# Patient Record
Sex: Male | Born: 1945
Health system: Southern US, Community
[De-identification: ages and names within clinical notes are randomized; demographics above are authoritative.]

## PROBLEM LIST (undated history)

## (undated) DIAGNOSIS — I639 Cerebral infarction, unspecified: Secondary | ICD-10-CM

## (undated) HISTORY — PX: ABDOMINAL SURGERY: SHX537

---

## 2018-03-21 ENCOUNTER — Encounter: Payer: Self-pay | Admitting: Emergency Medicine

## 2018-03-21 ENCOUNTER — Emergency Department
Admission: EM | Admit: 2018-03-21 | Discharge: 2018-03-21 | Disposition: A | Discharge status: Home / Self Care | Payer: Medicare Other | Attending: Emergency Medicine | Admitting: Emergency Medicine

## 2018-03-21 ENCOUNTER — Other Ambulatory Visit: Payer: Self-pay

## 2018-03-21 ENCOUNTER — Emergency Department: Payer: Medicare Other

## 2018-03-21 DIAGNOSIS — R4182 Altered mental status, unspecified: Secondary | ICD-10-CM | POA: Diagnosis not present

## 2018-03-21 DIAGNOSIS — W19XXXA Unspecified fall, initial encounter: Secondary | ICD-10-CM

## 2018-03-21 DIAGNOSIS — N390 Urinary tract infection, site not specified: Secondary | ICD-10-CM | POA: Diagnosis not present

## 2018-03-21 DIAGNOSIS — R531 Weakness: Secondary | ICD-10-CM | POA: Diagnosis present

## 2018-03-21 HISTORY — DX: Cerebral infarction, unspecified: I63.9

## 2018-03-21 LAB — URINALYSIS, COMPLETE (UACMP) WITH MICROSCOPIC
Bacteria, UA: NONE SEEN
Bilirubin Urine: NEGATIVE
GLUCOSE, UA: NEGATIVE mg/dL
HGB URINE DIPSTICK: NEGATIVE
Ketones, ur: NEGATIVE mg/dL
NITRITE: NEGATIVE
Protein, ur: NEGATIVE mg/dL
Specific Gravity, Urine: 1.021 (ref 1.005–1.030)
pH: 5 (ref 5.0–8.0)

## 2018-03-21 LAB — COMPREHENSIVE METABOLIC PANEL
ALK PHOS: 84 U/L (ref 38–126)
ALT: 34 U/L (ref 0–44)
ANION GAP: 10 (ref 5–15)
AST: 33 U/L (ref 15–41)
Albumin: 3.9 g/dL (ref 3.5–5.0)
BILIRUBIN TOTAL: 1.2 mg/dL (ref 0.3–1.2)
BUN: 20 mg/dL (ref 8–23)
CALCIUM: 9.4 mg/dL (ref 8.9–10.3)
CO2: 24 mmol/L (ref 22–32)
Chloride: 106 mmol/L (ref 98–111)
Creatinine, Ser: 1.31 mg/dL — ABNORMAL HIGH (ref 0.61–1.24)
GFR, EST NON AFRICAN AMERICAN: 53 mL/min — AB (ref >60)
Glucose, Bld: 154 mg/dL — ABNORMAL HIGH (ref 70–99)
Potassium: 4.2 mmol/L (ref 3.5–5.1)
SODIUM: 140 mmol/L (ref 135–145)
Total Protein: 7.6 g/dL (ref 6.5–8.1)

## 2018-03-21 LAB — CBC WITH DIFFERENTIAL/PLATELET
Abs Immature Granulocytes: 0.02 10*3/uL (ref 0.00–0.07)
Basophils Absolute: 0 10*3/uL (ref 0.0–0.1)
Basophils Relative: 1 %
EOS PCT: 0 %
Eosinophils Absolute: 0 10*3/uL (ref 0.0–0.5)
HEMATOCRIT: 46.5 % (ref 39.0–52.0)
HEMOGLOBIN: 14.5 g/dL (ref 13.0–17.0)
Immature Granulocytes: 0 %
LYMPHS PCT: 22 %
Lymphs Abs: 1.2 10*3/uL (ref 0.7–4.0)
MCH: 27.2 pg (ref 26.0–34.0)
MCHC: 31.2 g/dL (ref 30.0–36.0)
MCV: 87.2 fL (ref 80.0–100.0)
MONO ABS: 0.5 10*3/uL (ref 0.1–1.0)
Monocytes Relative: 10 %
Neutro Abs: 3.7 10*3/uL (ref 1.7–7.7)
Neutrophils Relative %: 67 %
Platelets: 179 10*3/uL (ref 150–400)
RBC: 5.33 MIL/uL (ref 4.22–5.81)
RDW: 13.5 % (ref 11.5–15.5)
WBC: 5.5 10*3/uL (ref 4.0–10.5)
nRBC: 0 % (ref 0.0–0.2)

## 2018-03-21 LAB — TROPONIN I

## 2018-03-21 MED ORDER — CEPHALEXIN 250 MG PO CAPS: 250.0000 mg | ORAL_CAPSULE | Freq: Four times a day (QID) | ORAL | 0 refills | Status: AC

## 2018-03-21 NOTE — ED Notes (Signed)
Report to Kim RN

## 2018-03-21 NOTE — ED Provider Notes (Addendum)
[facility] Emergency Department Provider Note       Time seen: ----------------------------------------- 9:53 AM on 03/21/2018 -----------------------------------------   I have reviewed the triage vital signs and the nursing notes.  HISTORY   Chief Complaint No chief complaint on file.    HPI Jeffery Reilly is a 72 y.o. male with a history of CVA 2 months ago who presents to the ED for a fall and weakness.  Patient was found by his wife in the front yard.  Patient states he tripped over something in the yard and fell.  He denies any complaints.  Patient reportedly had a stroke 2 months ago involving the right side of his body.  He denies any complaints, states he feels like he is at his baseline.  They were unable to help him up so they called EMS.  Patient states he was walking to [place] this [place] and did not tell his wife.  No past medical history on file.  There are no active problems to display for this patient.  Allergies Patient has no allergy information on record.  Social History Social History   Tobacco Use  . Smoking status: Not on file  Substance Use Topics  . Alcohol use: Not on file  . Drug use: Not on file   Review of Systems Constitutional: Negative for fever. Cardiovascular: Negative for chest pain. Respiratory: Negative for shortness of breath. Gastrointestinal: Negative for abdominal pain, vomiting and diarrhea. Musculoskeletal: Negative for back pain. Skin: Negative for rash. Neurological: Positive for chronic right-sided deficits, nothing acute  All systems negative/normal/unremarkable except as stated in the HPI  ____________________________________________   PHYSICAL EXAM:  VITAL SIGNS: ED Triage Vitals  Enc Vitals Group     BP      Pulse      Resp      Temp      Temp src      SpO2      Weight      Height      Head Circumference      Peak Flow      Pain Score      Pain Loc      Pain Edu?    Excl. in GC?    Constitutional: Alert but does not know the date.  Well appearing and in no distress. Eyes: Conjunctivae are normal. Normal extraocular movements. ENT   Head: Normocephalic and atraumatic.   Nose: No congestion/rhinnorhea.   Mouth/Throat: Mucous membranes are moist.   Neck: No stridor. Cardiovascular: Normal rate, regular rhythm. No murmurs, rubs, or gallops. Respiratory: Normal respiratory effort without tachypnea nor retractions. Breath sounds are clear and equal bilaterally. No wheezes/rales/rhonchi. Gastrointestinal: Soft and nontender. Normal bowel sounds Musculoskeletal: Nontender with normal range of motion in extremities. No lower extremity tenderness nor edema. Neurologic: Slurred speech, right-sided weakness compared to left, right facial drooping compared to left which according to family is chronic Skin:  Skin is warm, dry and intact. No rash noted. Psychiatric: Slurred speech, normal mood ____________________________________________  EKG: Interpreted by me.  Sinus tachycardia with a rate of 100 bpm, normal PR interval, normal QRS, borderline long QT  ____________________________________________  ED COURSE:  As part of my medical decision making, I reviewed the following data within the electronic MEDICAL RECORD NUMBER History obtained from family if available, nursing notes, old chart and ekg, as well as notes from prior ED visits. Patient presented for fall and weakness, we will assess with labs and imaging as indicated at  this time.   Procedures ____________________________________________   LABS (pertinent positives/negatives)  Labs Reviewed  COMPREHENSIVE METABOLIC PANEL - Abnormal; Notable for the following components:      Result Value   Glucose, Bld 154 (*)    Creatinine, Ser 1.31 (*)    GFR calc non Af Amer 53 (*)    All other components within normal limits  CBC WITH DIFFERENTIAL/PLATELET  TROPONIN I  URINALYSIS, COMPLETE (UACMP)  WITH MICROSCOPIC    RADIOLOGY Images were viewed by me  CT head Did not reveal any acute process ____________________________________________  DIFFERENTIAL DIAGNOSIS   CVA, TIA, dehydration, electrolyte abnormality, weakness  FINAL ASSESSMENT AND PLAN  Fall, mild UTI   Plan: The patient had presented for fall with previous right-sided weakness from a CVA. Patient's labs were unremarkable except for mild UTI. Patient's imaging was also unremarkable.  Patient appears to be at his baseline and is cleared for outpatient follow-up.  He was able to ambulate without any assistance here.   Ulice Dash, MD   Note: This note was generated in part or whole with voice recognition software. Voice recognition is usually quite accurate but there are transcription errors that can and very often do occur. I apologize for any typographical errors that were not detected and corrected.     Emily Filbert, MD 03/21/18 1109    Emily Filbert, MD 03/21/18 [phone]

## 2018-03-21 NOTE — ED Triage Notes (Signed)
Pt arrived via EMS from home with reports fall and weakness. Pt was found by wife in front yard, pt states he tripped over something in his front yard and fell.  Pt states he walked to [place] around 0700, wife reported to EMS pt has had unsteady gait and increased lower extremity weakness.   Pt had stroke about 2 months ago with R sided deficits including slurred speech and facial droop. Pt also has limited use of R right arm per EMS.    Pt is alert and oriented on arrival

## 2018-03-21 NOTE — ED Notes (Signed)
Returned from CT.

## 2018-03-21 NOTE — ED Notes (Signed)
Pt and Pt's family verbalized understanding of discharge instructions. NAD at this time. 

## 2018-03-21 NOTE — ED Notes (Signed)
Pt ambulated by this RN. Pt able to ambulate slowly without assist.

## 2018-03-21 NOTE — ED Notes (Signed)
Patient transported to CT 

## 2019-10-30 ENCOUNTER — Encounter (HOSPITAL_COMMUNITY)
Disposition: A | Discharge status: Rehabilitation Facility | Payer: Self-pay | Source: Other Acute Inpatient Hospital | Attending: Neurology

## 2019-10-30 ENCOUNTER — Inpatient Hospital Stay (HOSPITAL_COMMUNITY)
Admit: 2019-10-30 | Discharge: 2019-11-03 | DRG: 023 | Disposition: A | Discharge status: Rehabilitation Facility | Payer: Medicare Other | Source: Other Acute Inpatient Hospital | Attending: Neurology | Admitting: Neurology

## 2019-10-30 ENCOUNTER — Inpatient Hospital Stay (HOSPITAL_COMMUNITY): Payer: Medicare Other | Admitting: Anesthesiology

## 2019-10-30 ENCOUNTER — Emergency Department: Payer: Medicare Other

## 2019-10-30 ENCOUNTER — Inpatient Hospital Stay (HOSPITAL_COMMUNITY): Payer: Medicare Other

## 2019-10-30 ENCOUNTER — Emergency Department
Admission: EM | Admit: 2019-10-30 | Discharge: 2019-10-30 | Disposition: A | Discharge status: Other Acute Inpatient Hospital | Payer: Medicare Other | Attending: Emergency Medicine | Admitting: Emergency Medicine

## 2019-10-30 ENCOUNTER — Other Ambulatory Visit: Payer: Self-pay

## 2019-10-30 ENCOUNTER — Encounter (HOSPITAL_COMMUNITY): Payer: Self-pay | Admitting: Student in an Organized Health Care Education/Training Program

## 2019-10-30 DIAGNOSIS — R414 Neurologic neglect syndrome: Secondary | ICD-10-CM | POA: Insufficient documentation

## 2019-10-30 DIAGNOSIS — I639 Cerebral infarction, unspecified: Secondary | ICD-10-CM | POA: Diagnosis present

## 2019-10-30 DIAGNOSIS — R29810 Facial weakness: Secondary | ICD-10-CM | POA: Diagnosis present

## 2019-10-30 DIAGNOSIS — I672 Cerebral atherosclerosis: Secondary | ICD-10-CM | POA: Diagnosis present

## 2019-10-30 DIAGNOSIS — R001 Bradycardia, unspecified: Secondary | ICD-10-CM | POA: Diagnosis present

## 2019-10-30 DIAGNOSIS — I161 Hypertensive emergency: Secondary | ICD-10-CM | POA: Diagnosis present

## 2019-10-30 DIAGNOSIS — Z9119 Patient's noncompliance with other medical treatment and regimen: Secondary | ICD-10-CM | POA: Diagnosis not present

## 2019-10-30 DIAGNOSIS — R569 Unspecified convulsions: Secondary | ICD-10-CM | POA: Diagnosis present

## 2019-10-30 DIAGNOSIS — J95821 Acute postprocedural respiratory failure: Secondary | ICD-10-CM | POA: Diagnosis not present

## 2019-10-30 DIAGNOSIS — I69351 Hemiplegia and hemiparesis following cerebral infarction affecting right dominant side: Secondary | ICD-10-CM | POA: Diagnosis not present

## 2019-10-30 DIAGNOSIS — E782 Mixed hyperlipidemia: Secondary | ICD-10-CM | POA: Diagnosis not present

## 2019-10-30 DIAGNOSIS — Z20822 Contact with and (suspected) exposure to covid-19: Secondary | ICD-10-CM | POA: Diagnosis present

## 2019-10-30 DIAGNOSIS — E1151 Type 2 diabetes mellitus with diabetic peripheral angiopathy without gangrene: Secondary | ICD-10-CM | POA: Diagnosis present

## 2019-10-30 DIAGNOSIS — I6389 Other cerebral infarction: Secondary | ICD-10-CM | POA: Diagnosis not present

## 2019-10-30 DIAGNOSIS — E785 Hyperlipidemia, unspecified: Secondary | ICD-10-CM | POA: Diagnosis present

## 2019-10-30 DIAGNOSIS — I69391 Dysphagia following cerebral infarction: Secondary | ICD-10-CM | POA: Diagnosis not present

## 2019-10-30 DIAGNOSIS — R29715 NIHSS score 15: Secondary | ICD-10-CM | POA: Diagnosis present

## 2019-10-30 DIAGNOSIS — Z8673 Personal history of transient ischemic attack (TIA), and cerebral infarction without residual deficits: Secondary | ICD-10-CM | POA: Diagnosis not present

## 2019-10-30 DIAGNOSIS — J9601 Acute respiratory failure with hypoxia: Secondary | ICD-10-CM | POA: Diagnosis not present

## 2019-10-30 DIAGNOSIS — E7800 Pure hypercholesterolemia, unspecified: Secondary | ICD-10-CM | POA: Diagnosis not present

## 2019-10-30 DIAGNOSIS — I63411 Cerebral infarction due to embolism of right middle cerebral artery: Secondary | ICD-10-CM | POA: Diagnosis present

## 2019-10-30 DIAGNOSIS — I63511 Cerebral infarction due to unspecified occlusion or stenosis of right middle cerebral artery: Secondary | ICD-10-CM | POA: Diagnosis not present

## 2019-10-30 DIAGNOSIS — R4701 Aphasia: Secondary | ICD-10-CM | POA: Diagnosis present

## 2019-10-30 DIAGNOSIS — Z8249 Family history of ischemic heart disease and other diseases of the circulatory system: Secondary | ICD-10-CM | POA: Diagnosis not present

## 2019-10-30 DIAGNOSIS — R1311 Dysphagia, oral phase: Secondary | ICD-10-CM | POA: Diagnosis present

## 2019-10-30 DIAGNOSIS — I878 Other specified disorders of veins: Secondary | ICD-10-CM | POA: Diagnosis present

## 2019-10-30 DIAGNOSIS — R471 Dysarthria and anarthria: Secondary | ICD-10-CM | POA: Diagnosis present

## 2019-10-30 DIAGNOSIS — I10 Essential (primary) hypertension: Secondary | ICD-10-CM | POA: Diagnosis present

## 2019-10-30 DIAGNOSIS — F482 Pseudobulbar affect: Secondary | ICD-10-CM | POA: Diagnosis not present

## 2019-10-30 DIAGNOSIS — Z87891 Personal history of nicotine dependence: Secondary | ICD-10-CM | POA: Diagnosis not present

## 2019-10-30 DIAGNOSIS — R1312 Dysphagia, oropharyngeal phase: Secondary | ICD-10-CM | POA: Diagnosis not present

## 2019-10-30 DIAGNOSIS — R29713 NIHSS score 13: Secondary | ICD-10-CM | POA: Diagnosis not present

## 2019-10-30 DIAGNOSIS — E1159 Type 2 diabetes mellitus with other circulatory complications: Secondary | ICD-10-CM | POA: Diagnosis not present

## 2019-10-30 DIAGNOSIS — E1165 Type 2 diabetes mellitus with hyperglycemia: Secondary | ICD-10-CM | POA: Diagnosis present

## 2019-10-30 HISTORY — PX: IR CT HEAD LTD: IMG2386

## 2019-10-30 HISTORY — PX: IR TRANSCATH/EMBOLIZ: IMG695

## 2019-10-30 HISTORY — PX: IR INTRA CRAN STENT: IMG2345

## 2019-10-30 HISTORY — PX: IR US GUIDE VASC ACCESS RIGHT: IMG2390

## 2019-10-30 HISTORY — PX: IR PERCUTANEOUS ART THROMBECTOMY/INFUSION INTRACRANIAL INC DIAG ANGIO: IMG6087

## 2019-10-30 HISTORY — PX: RADIOLOGY WITH ANESTHESIA: SHX6223

## 2019-10-30 LAB — POCT I-STAT 7, (LYTES, BLD GAS, ICA,H+H)
Acid-Base Excess: 0 mmol/L (ref 0.0–2.0)
Bicarbonate: 24.1 mmol/L (ref 20.0–28.0)
Calcium, Ion: 1.22 mmol/L (ref 1.15–1.40)
HCT: 34 % — ABNORMAL LOW (ref 39.0–52.0)
Hemoglobin: 11.6 g/dL — ABNORMAL LOW (ref 13.0–17.0)
O2 Saturation: 100 %
Patient temperature: 98
Potassium: 3.7 mmol/L (ref 3.5–5.1)
Sodium: 138 mmol/L (ref 135–145)
TCO2: 25 mmol/L (ref 22–32)
pCO2 arterial: 37.7 mmHg (ref 32.0–48.0)
pH, Arterial: 7.412 (ref 7.350–7.450)
pO2, Arterial: 181 mmHg — ABNORMAL HIGH (ref 83.0–108.0)

## 2019-10-30 LAB — COMPREHENSIVE METABOLIC PANEL
ALT: 31 U/L (ref 0–44)
AST: 23 U/L (ref 15–41)
Albumin: 3.7 g/dL (ref 3.5–5.0)
Alkaline Phosphatase: 81 U/L (ref 38–126)
Anion gap: 8 (ref 5–15)
BUN: 16 mg/dL (ref 8–23)
CO2: 25 mmol/L (ref 22–32)
Calcium: 8.9 mg/dL (ref 8.9–10.3)
Chloride: 105 mmol/L (ref 98–111)
Creatinine, Ser: 0.87 mg/dL (ref 0.61–1.24)
GFR calc Af Amer: >60 mL/min (ref >60)
GFR calc non Af Amer: >60 mL/min (ref >60)
Glucose, Bld: 131 mg/dL — ABNORMAL HIGH (ref 70–99)
Potassium: 3.9 mmol/L (ref 3.5–5.1)
Sodium: 138 mmol/L (ref 135–145)
Total Bilirubin: 0.8 mg/dL (ref 0.3–1.2)
Total Protein: 7.4 g/dL (ref 6.5–8.1)

## 2019-10-30 LAB — CBC
HCT: 44 % (ref 39.0–52.0)
Hemoglobin: 14 g/dL (ref 13.0–17.0)
MCH: 27.3 pg (ref 26.0–34.0)
MCHC: 31.8 g/dL (ref 30.0–36.0)
MCV: 85.8 fL (ref 80.0–100.0)
Platelets: 179 10*3/uL (ref 150–400)
RBC: 5.13 MIL/uL (ref 4.22–5.81)
RDW: 13.5 % (ref 11.5–15.5)
WBC: 6.3 10*3/uL (ref 4.0–10.5)
nRBC: 0 % (ref 0.0–0.2)

## 2019-10-30 LAB — DIFFERENTIAL
Abs Immature Granulocytes: 0.02 10*3/uL (ref 0.00–0.07)
Basophils Absolute: 0 10*3/uL (ref 0.0–0.1)
Basophils Relative: 1 %
Eosinophils Absolute: 0 10*3/uL (ref 0.0–0.5)
Eosinophils Relative: 1 %
Immature Granulocytes: 0 %
Lymphocytes Relative: 33 %
Lymphs Abs: 2.1 10*3/uL (ref 0.7–4.0)
Monocytes Absolute: 0.7 10*3/uL (ref 0.1–1.0)
Monocytes Relative: 10 %
Neutro Abs: 3.5 10*3/uL (ref 1.7–7.7)
Neutrophils Relative %: 55 %

## 2019-10-30 LAB — GLUCOSE, CAPILLARY: Glucose-Capillary: 119 mg/dL — ABNORMAL HIGH (ref 70–99)

## 2019-10-30 LAB — PROTIME-INR
INR: 1 (ref 0.8–1.2)
Prothrombin Time: 12.5 seconds (ref 11.4–15.2)

## 2019-10-30 LAB — ETHANOL: Alcohol, Ethyl (B): <10 mg/dL (ref <10)

## 2019-10-30 LAB — MRSA PCR SCREENING: MRSA by PCR: NEGATIVE

## 2019-10-30 LAB — APTT: aPTT: 33 seconds (ref 24–36)

## 2019-10-30 LAB — SARS CORONAVIRUS 2 BY RT PCR (HOSPITAL ORDER, PERFORMED IN ~~LOC~~ HOSPITAL LAB): SARS Coronavirus 2: NEGATIVE

## 2019-10-30 SURGERY — IR WITH ANESTHESIA
Anesthesia: General

## 2019-10-30 MED ORDER — PHENYLEPHRINE 40 MCG/ML (10ML) SYRINGE FOR IV PUSH (FOR BLOOD PRESSURE SUPPORT)
PREFILLED_SYRINGE | INTRAVENOUS | Status: DC | PRN
  Administered 2019-10-30 (×2): 120 ug via INTRAVENOUS

## 2019-10-30 MED ORDER — SENNOSIDES-DOCUSATE SODIUM 8.6-50 MG PO TABS: 1.0000 | ORAL_TABLET | Freq: Every evening | ORAL | Status: DC | PRN

## 2019-10-30 MED ORDER — ASPIRIN 81 MG PO CHEW
81.0000 mg | CHEWABLE_TABLET | Freq: Every day | ORAL | Status: DC
  Administered 2019-10-31: 81 mg
  Filled 2019-10-30: qty 1

## 2019-10-30 MED ORDER — ROCURONIUM BROMIDE 100 MG/10ML IV SOLN
INTRAVENOUS | Status: DC | PRN
  Administered 2019-10-30: 70 mg via INTRAVENOUS
  Administered 2019-10-30: 30 mg via INTRAVENOUS

## 2019-10-30 MED ORDER — ONDANSETRON HCL 4 MG/2ML IJ SOLN
INTRAMUSCULAR | Status: DC | PRN
  Administered 2019-10-30: 4 mg via INTRAVENOUS

## 2019-10-30 MED ORDER — FENTANYL CITRATE (PF) 100 MCG/2ML IJ SOLN
INTRAMUSCULAR | Status: AC
  Filled 2019-10-30: qty 2

## 2019-10-30 MED ORDER — IOHEXOL 350 MG/ML SOLN
75.0000 mL | Freq: Once | INTRAVENOUS | Status: AC | PRN
  Administered 2019-10-30: 75 mL via INTRAVENOUS

## 2019-10-30 MED ORDER — ASPIRIN 81 MG PO CHEW
81.0000 mg | CHEWABLE_TABLET | Freq: Every day | ORAL | Status: DC
  Administered 2019-11-01 – 2019-11-03 (×3): 81 mg via ORAL
  Filled 2019-10-30 (×3): qty 1

## 2019-10-30 MED ORDER — LIDOCAINE HCL (CARDIAC) PF 100 MG/5ML IV SOSY
PREFILLED_SYRINGE | INTRAVENOUS | Status: DC | PRN
Start: 2019-10-30 — End: 2019-10-30
  Administered 2019-10-30: 60 mg via INTRATRACHEAL

## 2019-10-30 MED ORDER — FENTANYL CITRATE (PF) 100 MCG/2ML IJ SOLN
INTRAMUSCULAR | Status: DC | PRN
  Administered 2019-10-30: 100 ug via INTRAVENOUS

## 2019-10-30 MED ORDER — IOHEXOL 240 MG/ML SOLN
INTRAMUSCULAR | Status: AC
  Filled 2019-10-30: qty 200

## 2019-10-30 MED ORDER — PROPOFOL 500 MG/50ML IV EMUL
INTRAVENOUS | Status: DC | PRN
  Administered 2019-10-30: 100 ug/kg/min via INTRAVENOUS

## 2019-10-30 MED ORDER — EPTIFIBATIDE 20 MG/10ML IV SOLN
INTRAVENOUS | Status: AC
  Filled 2019-10-30: qty 10

## 2019-10-30 MED ORDER — ACETAMINOPHEN 160 MG/5ML PO SOLN: 650.0000 mg | ORAL | Status: DC | PRN

## 2019-10-30 MED ORDER — ASPIRIN 325 MG PO TABS
ORAL_TABLET | ORAL | Status: AC | PRN
  Administered 2019-10-30: 650 mg

## 2019-10-30 MED ORDER — SODIUM CHLORIDE 0.9 % IV SOLN
250.0000 mL | INTRAVENOUS | Status: DC
  Administered 2019-11-02: 250 mL via INTRAVENOUS

## 2019-10-30 MED ORDER — SUCCINYLCHOLINE CHLORIDE 20 MG/ML IJ SOLN
INTRAMUSCULAR | Status: DC | PRN
Start: 2019-10-30 — End: 2019-10-30
  Administered 2019-10-30: 120 mg via INTRAVENOUS

## 2019-10-30 MED ORDER — LACTATED RINGERS IV SOLN
INTRAVENOUS | Status: DC | PRN
Start: 2019-10-30 — End: 2019-10-30

## 2019-10-30 MED ORDER — ASPIRIN 325 MG PO TABS
ORAL_TABLET | ORAL | Status: AC
  Filled 2019-10-30: qty 2

## 2019-10-30 MED ORDER — IOHEXOL 300 MG/ML  SOLN
150.0000 mL | Freq: Once | INTRAMUSCULAR | Status: AC | PRN
  Administered 2019-10-30: 100 mL via INTRA_ARTERIAL

## 2019-10-30 MED ORDER — VERAPAMIL HCL 2.5 MG/ML IV SOLN
INTRAVENOUS | Status: AC
  Filled 2019-10-30: qty 2

## 2019-10-30 MED ORDER — ACETAMINOPHEN 325 MG PO TABS: 650.0000 mg | ORAL_TABLET | ORAL | Status: DC | PRN

## 2019-10-30 MED ORDER — EPTIFIBATIDE 20 MG/10ML IV SOLN
INTRAVENOUS | Status: AC | PRN
  Administered 2019-10-30 (×2): 1.5 mg via INTRAVENOUS

## 2019-10-30 MED ORDER — CLOPIDOGREL BISULFATE 75 MG PO TABS
75.0000 mg | ORAL_TABLET | Freq: Every day | ORAL | Status: DC
  Administered 2019-10-31: 75 mg
  Filled 2019-10-30: qty 1

## 2019-10-30 MED ORDER — TIROFIBAN HCL IN NACL 5-0.9 MG/100ML-% IV SOLN
INTRAVENOUS | Status: AC
  Filled 2019-10-30: qty 100

## 2019-10-30 MED ORDER — DEXMEDETOMIDINE HCL IN NACL 400 MCG/100ML IV SOLN: 0.2000 ug/kg/h | INTRAVENOUS | Status: DC

## 2019-10-30 MED ORDER — ASPIRIN 81 MG PO CHEW
CHEWABLE_TABLET | ORAL | Status: AC
  Filled 2019-10-30: qty 1

## 2019-10-30 MED ORDER — SUGAMMADEX SODIUM 200 MG/2ML IV SOLN
INTRAVENOUS | Status: DC | PRN
  Administered 2019-10-30: 600 mg via INTRAVENOUS

## 2019-10-30 MED ORDER — NOREPINEPHRINE 4 MG/250ML-% IV SOLN: 2.0000 ug/min | INTRAVENOUS | Status: DC

## 2019-10-30 MED ORDER — SODIUM CHLORIDE 0.9 % IV SOLN: INTRAVENOUS | Status: DC

## 2019-10-30 MED ORDER — PROPOFOL 1000 MG/100ML IV EMUL
5.0000 ug/kg/min | INTRAVENOUS | Status: DC
  Administered 2019-10-30: 15 ug/kg/min via INTRAVENOUS
  Filled 2019-10-30 (×2): qty 100

## 2019-10-30 MED ORDER — TICAGRELOR 90 MG PO TABS
ORAL_TABLET | ORAL | Status: AC
  Filled 2019-10-30: qty 2

## 2019-10-30 MED ORDER — LORAZEPAM 2 MG/ML IJ SOLN
INTRAMUSCULAR | Status: AC
  Administered 2019-10-30: 1 mg via INTRAVENOUS
  Filled 2019-10-30: qty 1

## 2019-10-30 MED ORDER — CLEVIDIPINE BUTYRATE 0.5 MG/ML IV EMUL
0.0000 mg/h | INTRAVENOUS | Status: DC
  Administered 2019-10-30: 4 mg/h via INTRAVENOUS
  Administered 2019-10-31: 14 mg/h via INTRAVENOUS
  Administered 2019-10-31: 21 mg/h via INTRAVENOUS
  Filled 2019-10-30: qty 150
  Filled 2019-10-30 (×6): qty 50

## 2019-10-30 MED ORDER — ACETAMINOPHEN 650 MG RE SUPP
650.0000 mg | RECTAL | Status: DC | PRN
  Administered 2019-10-31: 650 mg via RECTAL
  Filled 2019-10-30: qty 1

## 2019-10-30 MED ORDER — LEVETIRACETAM IN NACL 1000 MG/100ML IV SOLN
1000.0000 mg | Freq: Once | INTRAVENOUS | Status: AC
  Administered 2019-10-30: 1000 mg via INTRAVENOUS
  Filled 2019-10-30: qty 100

## 2019-10-30 MED ORDER — IOHEXOL 300 MG/ML  SOLN
150.0000 mL | Freq: Once | INTRAMUSCULAR | Status: AC | PRN
  Administered 2019-10-30: 70 mL via INTRA_ARTERIAL

## 2019-10-30 MED ORDER — STROKE: EARLY STAGES OF RECOVERY BOOK
Freq: Once | Status: DC
  Filled 2019-10-30 (×2): qty 1

## 2019-10-30 MED ORDER — CLOPIDOGREL BISULFATE 75 MG PO TABS
ORAL_TABLET | ORAL | Status: AC | PRN
  Administered 2019-10-30: 300 mg

## 2019-10-30 MED ORDER — NOREPINEPHRINE 4 MG/250ML-% IV SOLN: 0.0000 ug/min | INTRAVENOUS | Status: DC

## 2019-10-30 MED ORDER — PHENYLEPHRINE HCL-NACL 10-0.9 MG/250ML-% IV SOLN
INTRAVENOUS | Status: DC | PRN
  Administered 2019-10-30: 50 ug/min via INTRAVENOUS

## 2019-10-30 MED ORDER — LORAZEPAM 2 MG/ML IJ SOLN: 1.0000 mg | Freq: Once | INTRAMUSCULAR | Status: AC

## 2019-10-30 MED ORDER — CLOPIDOGREL BISULFATE 300 MG PO TABS
ORAL_TABLET | ORAL | Status: AC
  Filled 2019-10-30: qty 1

## 2019-10-30 MED ORDER — PROPOFOL 10 MG/ML IV BOLUS
INTRAVENOUS | Status: DC | PRN
  Administered 2019-10-30: 50 mg via INTRAVENOUS
  Administered 2019-10-30: 100 mg via INTRAVENOUS
  Administered 2019-10-30: 50 mg via INTRAVENOUS
  Administered 2019-10-30: 30 mg via INTRAVENOUS

## 2019-10-30 MED ORDER — CLOPIDOGREL BISULFATE 75 MG PO TABS
75.0000 mg | ORAL_TABLET | Freq: Every day | ORAL | Status: DC
  Administered 2019-11-01 – 2019-11-03 (×3): 75 mg via ORAL
  Filled 2019-10-30 (×3): qty 1

## 2019-10-30 MED ORDER — PHENYLEPHRINE HCL-NACL 10-0.9 MG/250ML-% IV SOLN
0.0000 ug/min | INTRAVENOUS | Status: DC
  Administered 2019-10-30: 30 ug/min via INTRAVENOUS

## 2019-10-30 NOTE — Anesthesia Procedure Notes (Signed)
Arterial Line Insertion Start/End5/18/2021 5:53 PM, 10/30/2019 5:59 PM Performed by: Val Eagle, MD, anesthesiologist  Patient location: Pre-op. Preanesthetic checklist: patient identified, IV checked, site marked, risks and benefits discussed, surgical consent, monitors and equipment checked, pre-op evaluation, timeout performed and anesthesia consent Patient sedated Left, radial was placed Catheter size: 20 G Hand hygiene performed   Attempts: 1 Procedure performed without using ultrasound guided technique. Following insertion, dressing applied and Biopatch. Post procedure assessment: normal and unchanged  Patient tolerated the procedure well with no immediate complications.

## 2019-10-30 NOTE — ED Notes (Signed)
Code stroke called. EDP Jessup to bedside. Pt leaving for CT with Florentina Addison, RN.

## 2019-10-30 NOTE — Progress Notes (Signed)
eLink Physician-Brief Progress Note Patient Name: Pavlos Yon DOB: 03/04/1946 MRN: [id number]   Date of Service  10/30/2019  HPI/Events of Note  Pt admitted to 4 N ICU s/ p thrombectomy and stent placement for CVA secondary to right M1 occlusion, Pt with post-operative respiratory failure on the ventilator.  eICU Interventions  New Patient Evaluation completed. Propofol and Phenylephrine orders entered. SBP goal 120-140 mmHg, RAS goal -2 to -3.        Thomasene Lot Dequon Schnebly 10/30/2019, 10:01 PM

## 2019-10-30 NOTE — H&P (Signed)
Neurology history and physical  History is obtained from: Chart  HPI: Jeffery Reilly is a 74 y.o. male past medical history of mild residual right hemiparesis from a left cerebral hemispheric stroke  Patient presented to [facility] for confusion/aphasia. Per EMS he had been dealing with intermittent aphasia throughout the morning. EMS was previously called to residence by wife on 3 separate occasions. Each time his aphasia had resolved.  The last EMS call, they were called again for same symptoms and aphasia persisted.  Last known well sometime between 12 - 2 PM.  Along with aphasia, patient had right gaze deviation left sided weakness and possible neglect.  Patient was brought to [facility] hospital. CT and CTA head and neck were obttained.   CT head showed advanced chronic small vessel disease with no acute infarct.  Had some automatisms of the right arm and increased tone in all 4 extremities evaluated by neurologist at [facility], stroke as a differential was entertained but seizure was considered more likely -in any case obtained vessel imaging. CTA head and neck obtained showing distal right M1 MCA occlusion with partial reconstruction more distal right MCA.   Case was discussed with me (Dr Wilford Corner)  over the phone by Dr. Loretha Brasil, and I accepted the patient to IR after discussing the case with interventional radiologist Dr. Loreta Ave. I spoke with the wife and she reported that he is very independent at baseline, has some arthritis of the right knee that makes walking difficult but does not use a cane or a walker at baseline.  Able to feed and bathe himself normally without a problem.  He has not seen a doctor in the past many years.  I was also unable to find any Care Everywhere records.  LKW: sometime between 1200-400 hrs tpa given?: no, decision made at [facility] by [facility] neurologist, and by the time of arrival at [facility], if considering 12 PM last known well, outside the window  at that time. Premorbid modified Rankin scale (mRS): 2 NIHSS: 15   Past Medical History:  Diagnosis Date  . Stroke [facility])     Family History  Problem Relation Age of Onset  . Hypertension Mother   . Hypertension Father    Social History:   reports that he has quit smoking. He has never used smokeless tobacco. He reports that he does not drink alcohol or use drugs.  Medications  Current Facility-Administered Medications:  .   stroke: mapping our early stages of recovery book, , Does not apply, Once, Ulice Dash, PA-C .  0.9 %  sodium chloride infusion, , Intravenous, Continuous, Ulice Dash, PA-C .  acetaminophen (TYLENOL) tablet 650 mg, 650 mg, Oral, Q4H PRN **OR** acetaminophen (TYLENOL) 160 MG/5ML solution 650 mg, 650 mg, Per Tube, Q4H PRN **OR** acetaminophen (TYLENOL) suppository 650 mg, 650 mg, Rectal, Q4H PRN, Ulice Dash, PA-C .  aspirin 81 MG chewable tablet, , , ,  .  clopidogrel (PLAVIX) 300 MG tablet, , , ,  .  eptifibatide (INTEGRILIN) 20 MG/10ML injection, , , ,  .  fentaNYL (SUBLIMAZE) 100 MCG/2ML injection, , , ,  .  iohexol (OMNIPAQUE) 240 MG/ML injection, , , ,  .  senna-docusate (Senokot-S) tablet 1 tablet, 1 tablet, Oral, QHS PRN, Ulice Dash, PA-C .  ticagrelor (BRILINTA) 90 MG tablet, , , ,  .  tirofiban (AGGRASTAT) 5-0.9 MG/100ML-% injection, , , ,  .  verapamil (ISOPTIN) 2.5 MG/ML injection, , , ,    ROS:  Unable to obtain due to current mentation  Exam: vital signs in last 24 hours: Temp:  [99 F (37.2 C)] 99 F (37.2 C) (05/18 1652) Pulse Rate:  [86-90] 90 (05/18 1652) Resp:  [18-22] 19 (05/18 1652) BP: (101-186)/(64-97) 146/84 (05/18 1652) SpO2:  [96 %-99 %] 98 % (05/18 1652) Weight:  [98.1 kg] 98.1 kg (05/18 1522) Independently performed and documented by the attending Appears in no distress HEENT: Normocephalic atraumatic Cardiovascular: Regular rate and the Respiratory: Normal breathing saturation Abdomen: Nondistended  nontender Extremities: 2+ edema with chronic venous stasis changes in both lower extremities with weeping ulcers. Neurological exam Is awake, alert, oriented to self. Speech is severely dysarthric He has extremely poor attention concentration. Possibly mild aphasia as well Cranial nerves: Gaze is forced to the right, no blink to threat from the left, face surprisingly appears symmetric with intact nasolabial folds bilaterally. Motor exam: Has mildly increased tone in the right upper extremity but is able to raise it against gravity without drift.  Left upper extremity also has mild increased tone but drifts without hitting the bed in 10 seconds. Both lower extremities are barely antigravity. Sensory exam: Intact to touch but he seems to be preferring the right side more than the left. Coordination: Difficult to assess with his mentation NIH stroke scale 1a Level of Conscious.: 0 1b LOC Questions: 2 1c LOC Commands: 0 2 Best Gaze: 2 3 Visual: 2 4 Facial Palsy: 0 5a Motor Arm - left: 1 5b Motor Arm - Right: 0 6a Motor Leg - Left: 2 6b Motor Leg - Right: 2 7 Limb Ataxia: 0 8 Sensory: 0 9 Best Language: 1 10 Dysarthria: 2 11 Extinct. and Inatten.: 1 TOTAL: 15  Labs I have reviewed labs in epic and the results pertinent to this consultation are:   CBC    Component Value Date/Time   WBC 6.3 10/30/2019 1527   RBC 5.13 10/30/2019 1527   HGB 14.0 10/30/2019 1527   HCT 44.0 10/30/2019 1527   PLT 179 10/30/2019 1527   MCV 85.8 10/30/2019 1527   MCH 27.3 10/30/2019 1527   MCHC 31.8 10/30/2019 1527   RDW 13.5 10/30/2019 1527   LYMPHSABS 2.1 10/30/2019 1527   MONOABS 0.7 10/30/2019 1527   EOSABS 0.0 10/30/2019 1527   BASOSABS 0.0 10/30/2019 1527    CMP     Component Value Date/Time   NA 138 10/30/2019 1527   K 3.9 10/30/2019 1527   CL 105 10/30/2019 1527   CO2 25 10/30/2019 1527   GLUCOSE 131 (H) 10/30/2019 1527   BUN 16 10/30/2019 1527   CREATININE 0.87 10/30/2019 1527    CALCIUM 8.9 10/30/2019 1527   PROT 7.4 10/30/2019 1527   ALBUMIN 3.7 10/30/2019 1527   AST 23 10/30/2019 1527   ALT 31 10/30/2019 1527   ALKPHOS 81 10/30/2019 1527   BILITOT 0.8 10/30/2019 1527   GFRNONAA >60 10/30/2019 1527   GFRAA >60 10/30/2019 1527    Imaging I have reviewed the images obtained: CT head showed advanced chronic small vessel disease with no acute infarct.  No bleed.  Aspects 10. CTA head and neck obtained showing distal right M1 MCA occlusion with partial reconstruction more distal right MCA.    Etta Quill PA-C [facility] [phone]  M-F  (9:00 am- 5:00 PM)  10/30/2019, 5:43 PM    Attending addendum Patient seen and examined Agree with history and physical documented above. Independently obtained history from the wife and chart. I have dictated my examination  above. Spoke with the wife in [place] documented my conversation in the HPI.  Assessment:  74 year old with mild residual right hemiparesis from prior left hemispheric stroke, presenting to the emergency room for evaluation of intermittent episodes of speech problems which completely resolved but at some time with last known normal of 12 PM to 2 PM, had an episode where his gaze was to the right, appeared confused and was brought in for emergent stroke evaluation. Seen by neurology, had some right upper extremity automatisms concerning for seizures but also had left hemiparesis which was new and vascular imaging revealed right M1 occlusion.   Transferred to [facility] for evaluation for potential thrombectomy. tPA not given at the outside facility by the neurologist due to initial concern more for seizure more than stroke and fluctuating exam. Case discussed with interventional radiology, Dr. Loreta Ave and taken in for diagnostic cerebral angiogram and potential mechanical thrombectomy.  Consent was obtained over the phone with the wife.  Dr. Loreta Ave also spoke with the wife in detail  and obtained consent.  tPA not given-by initial neurological evaluation at the outside hospital.   Outside the window for IV TPA considering noon as last known normal by the time arrived at [facility]. Remained in the window for thrombectomy-still within 6 hours of last normal hence did not pursue CT perfusion.  Impression -Acute right MCA stroke -Right MCA occlusion-acute versus acute on chronic -Prior left MCA stroke with mild residual right hemiparesis -Possible other underlying medical conditions which are unknown because he has not seen a doctor in many years.  Recommendations/plan: Acute Ischemic Stroke Cerebral infarction due to embolism of right middle cerebral artery Admit to neurological ICU Continue aspirin Continue statin Blood pressure goal-if successfully revascularized, 120-140 systolic otherwise allow for permissive hypertension up to 220. MRI Echo A1c Lipid panel PT OT speech therapy Hyperglycemia management-SSI to maintain glucose between 1 40-1 80.   CNS -Close neuro monitoring -For dysarthria/dysphagia following cerebral infarction, n.p.o. until cleared by speech, obtain speech therapy or bedside swallow evaluation. -For hemiplegia/hemiparesis affecting the left nondominant side-PT, OT, PMR consult  Respiratory Possible aspiration pneumonia -Chest x-ray -N.p.o. -Being intubated for IR procedure.  Extubate when able.  Cardiovascular -Unknown cardiac history -Obtain twelve-lead EKG -Blood pressure goal as above -2D echocardiogram -Check LDL-statin for LDL goal of less than 70.  Heme No leukocytosis, normal hemoglobin. Repeat labs in the morning  ENDO Check A1c No known history of diabetes  GI/GU Normal creatinine Check labs in the morning Gentle hydration with normal saline 75 cc an hour  Fluid/Electrolyte Disorders -No acute issues, check labs in the morning and replete as necessary  ID Possible Aspiration  PNA -CXR -NPO  Prophylaxis DVT: SCDs GI: PPI Bowel: Docusate senna  Diet: NPO until cleared by speech/bedside stroke screen  Code Status: Full Code  Present on admission: Acute ischemic stroke, hemiparesis/hemiplegia, possible aspiration pneumonia  -- Milon Dikes, MD [facility] Pager: [phone] If 7pm to 7am, please call on call as listed on AMION.  CRITICAL CARE ATTESTATION Performed by: Milon Dikes, MD Total critical care time: 55 minutes Critical care time was exclusive of separately billable procedures and treating other patients and/or supervising APPs/Residents/Students Critical care was necessary to treat or prevent imminent or life-threatening deterioration due to acute ischemic stroke, decision for thrombectomy. This patient is critically ill and at significant risk for neurological worsening and/or death and care requires constant monitoring. Critical care was time spent personally by me on the following activities: development of  treatment plan with patient and/or surrogate as well as nursing, discussions with consultants, evaluation of patient's response to treatment, examination of patient, obtaining history from patient or surrogate, ordering and performing treatments and interventions, ordering and review of laboratory studies, ordering and review of radiographic studies, pulse oximetry, re-evaluation of patient's condition, participation in multidisciplinary rounds and medical decision making of high complexity in the care of this patient.

## 2019-10-30 NOTE — Progress Notes (Signed)
[facility] paged to ED for Code Stroke; pt. not in rm. --> in CT acc. to ED secretary.  Pt. returned to rm. shortly after; pt. not able to speak, reaching out with right hand, eyes fixed to the right. RN said no family present to her knowledge.  EMS said wife called ambulance --> gave [facility] wife's phone number in case needed.  Neurologist assessed pt. --> called wife and informed her of situation.  No further needs at this time.    10/30/19 1500  Clinical Encounter Type  Visited With Patient;Health care provider  Visit Type Initial;Social support;Psychological support (CODE STROKE)  Referral From Nurse

## 2019-10-30 NOTE — Consult Note (Signed)
Reason for Consult: seizure activity vs stroke  Requesting Physician: Dr. Charna Archer  CC: aphasia  HPI: Jeffery Reilly is an 74 y.o. male with hx of prior L MCA stroke, HTN prior baseline R sided weakness as per wife but has not seen a physician in 6-7 yrs and is not on any medications. Pt had fluctuations of aphasia that started this AM. Multiple episodes that would resolve. EMS would arrive and be sent back.  Last one started at 2 PM and he stopped following commands with aphasia.  Pt has automatism with RUE hand movements which is weak at baseline along with increased tone.    Past Medical History:  Diagnosis Date  . Stroke [facility])     Past Surgical History:  Procedure Laterality Date  . ABDOMINAL SURGERY      No family history on file.  Social History:  reports that he has quit smoking. He has never used smokeless tobacco. He reports that he does not drink alcohol or use drugs.  No Known Allergies  Medications: I have reviewed the patient's current medications.  ROS: Unable to obtain as not following commands.    Physical Examination: Blood pressure (!) 186/94, temperature 99 F (37.2 C), temperature source Oral, height 5\' 10"  (1.778 m), weight 98.1 kg.  Patient has forced deviation to the R side.  RUE automatism movements that look like partial seizure  Increased tone bilateral lower extremities and RUE Withdrawal from pain bilaterlly He is protecting his airway Does have pupils and corneals.  L side nedlect  Laboratory Studies:   Basic Metabolic Panel: No results for input(s): NA, K, CL, CO2, GLUCOSE, BUN, CREATININE, CALCIUM, MG, PHOS in the last 168 hours.  Liver Function Tests: No results for input(s): AST, ALT, ALKPHOS, BILITOT, PROT, ALBUMIN in the last 168 hours. No results for input(s): LIPASE, AMYLASE in the last 168 hours. No results for input(s): AMMONIA in the last 168 hours.  CBC: No results for input(s): WBC, NEUTROABS, HGB, HCT, MCV, PLT in the last 168  hours.  Cardiac Enzymes: No results for input(s): CKTOTAL, CKMB, CKMBINDEX, TROPONINI in the last 168 hours.  BNP: Invalid input(s): POCBNP  CBG: No results for input(s): GLUCAP in the last 168 hours.  Microbiology: No results found for this or any previous visit.  Coagulation Studies: No results for input(s): LABPROT, INR in the last 72 hours.  Urinalysis: No results for input(s): COLORURINE, LABSPEC, PHURINE, GLUCOSEU, HGBUR, BILIRUBINUR, KETONESUR, PROTEINUR, UROBILINOGEN, NITRITE, LEUKOCYTESUR in the last 168 hours.  Invalid input(s): APPERANCEUR  Lipid Panel:  No results found for: CHOL, TRIG, HDL, CHOLHDL, VLDL, LDLCALC  HgbA1C: No results found for: HGBA1C  Urine Drug Screen:  No results found for: LABOPIA, COCAINSCRNUR, LABBENZ, AMPHETMU, THCU, LABBARB  Alcohol Level: No results for input(s): ETH in the last 168 hours.  Other results: EKG: normal EKG, normal sinus rhythm, unchanged from previous tracings.  Imaging: CT HEAD CODE STROKE WO CONTRAST`  Result Date: 10/30/2019 CLINICAL DATA:  Code stroke. 74 year old male last known well 1100 hours. Right gaze deviation and aphasia. EXAM: CT HEAD WITHOUT CONTRAST TECHNIQUE: Contiguous axial images were obtained from the base of the skull through the vertex without intravenous contrast. COMPARISON:  Head CT 03/21/2018. FINDINGS: Brain: Stable cerebral volume. No midline shift, mass effect, or evidence of intracranial mass lesion. No ventriculomegaly. No acute intracranial hemorrhage identified. Advanced chronic small vessel ischemia in the bilateral corona radiata and deep gray nuclei, with Patchy and confluent bilateral hypodensity more so on the left. Some  associated ex vacuo enlargement of the left lateral ventricle. But gray-white matter differentiation appears stable since 2019. No cortically based acute infarct identified. No cortical encephalomalacia identified. Vascular: Mild Calcified atherosclerosis at the skull base.  No suspicious intracranial vascular hyperdensity. Skull: Stable, negative. Sinuses/Orbits: Chronic bubbly opacity in the left sphenoid. Other Visualized paranasal sinuses and mastoids are stable and well pneumatized. Other: Rightward gaze deviation. No acute scalp soft tissue finding. ASPECTS [facility] Stroke Program Early CT Score) Total score (0-10 with 10 being normal): 10 (chronic encephalomalacia). IMPRESSION: 1. Advanced chronic small vessel disease appears stable by CT since 2019. 2. No acute cortically based infarct or acute intracranial hemorrhage identified. ASPECTS 10. Electronically Signed   By: Odessa Fleming M.D.   On: 10/30/2019 15:25     Assessment/Plan:  74 y.o. male with hx of prior L MCA stroke, HTN prior baseline R sided weakness as per wife but has not seen a physician in 6-7 yrs and is not on any medications. Pt had fluctuations of aphasia that started this AM. Multiple episodes that would resolve. EMS would arrive and be sent back.  Last one started at 2 PM and he stopped following commands with aphasia.  Pt has automatism with RUE hand movements which is weak at baseline along with increased tone.    - CTH with L MCA stroke about 20 years ago - significant WM changes consistent with prior ischemia - suspect partial seizures with generalization  - hold of TPA as suspect seizures - ativan - 1g keppra now - CTA h and neck now to make sure no LVO - transfer to [facility] as we need EEG continous - will likely need step down - d/w [facility] 10/30/2019, 3:38 PM

## 2019-10-30 NOTE — Progress Notes (Signed)
CODE STROKE- PHARMACY COMMUNICATION   Time CODE STROKE called/page received: 1516  Time response to CODE STROKE was made (in person or via phone): 1530  Time Stroke Kit retrieved from Pyxis (only if needed): 1530  Name of Provider/Nurse contacted: neurologist, EDP and RN - no tPA    Abby K Ellington ,PharmD Clinical Pharmacist  10/30/2019  3:37 PM   

## 2019-10-30 NOTE — Progress Notes (Signed)
NeuroInterventional Radiology  Pre-Procedure Note  History: 74 yo male with acute aphasia.  Evaluated at [facility] ED dept.  NIR contacted by our stroke neurology team with [facility] presentation.   NIHSS:  13  CT ASPECTS: 10 CTA:   Right M1 occlusion   Case discussed with Dr. Wilford Corner. Given the patient's symptoms, imaging findings, baseline function, I agree they are an appropriate candidate for attempt at mechanical thrombectomy.    The risks and benefits of the procedure were discussed with the patient's family/wife, with specific risks including: bleeding, infection, arterial injury/dissection, contrast reaction, kidney injury, need for further procedure/surgery, neurologic deficit, 10-15% risk of intracranial hemorrhage, cardiopulmonary collapse, death. All questions were answered.  The patient/family would like to proceed with attempt at thrombectomy.   Plan for cerebral angiogram and attempt at mechanical thrombectomy.   Signed,   Yvone Neu. Loreta Ave, DO

## 2019-10-30 NOTE — ED Provider Notes (Addendum)
[facility] Emergency Department Provider Note   ____________________________________________   First MD Initiated Contact with Patient 10/30/19 1513     (approximate)  I have reviewed the triage vital signs and the nursing notes.   HISTORY  Chief Complaint Stroke   HPI Jeffery Reilly is a 73 y.o. male with past medical history of stroke who presents to the ED for aphasia.  History is limited due to patient's aphasia.  Per EMS, he has been dealing with intermittent aphasia throughout the morning.  EMS was previously called to patient's residence on 3 separate occasions for difficulty speaking reported by the wife.  On each occasion, the patient's speech had returned to his baseline and he refused transport.  He then had recurrence of aphasia at 2 PM this afternoon, when EMS was called for a fourth time.  Aphasia persisted on their arrival and EMS also noted left-sided neglect.  He reportedly has right-sided deficits at baseline from prior stroke, has not been taking medications for greater than 1 year and does not follow regularly with a PCP.        Past Medical History:  Diagnosis Date  . Stroke [facility])     There are no problems to display for this patient.   Past Surgical History:  Procedure Laterality Date  . ABDOMINAL SURGERY      Prior to Admission medications   Not on File    Allergies Patient has no known allergies.  No family history on file.  Social History Social History   Tobacco Use  . Smoking status: Former Games developer  . Smokeless tobacco: Never Used  Substance Use Topics  . Alcohol use: Never  . Drug use: Never    Review of Systems Unable to obtain secondary to aphasia ____________________________________________   PHYSICAL EXAM:  VITAL SIGNS: ED Triage Vitals  Enc Vitals Group     BP 10/30/19 1508 (!) 186/94     Pulse --      Resp --      Temp 10/30/19 1510 99 F (37.2 C)     Temp Source 10/30/19 1510 Oral      SpO2 --      Weight --      Height --      Head Circumference --      Peak Flow --      Pain Score --      Pain Loc --      Pain Edu? --      Excl. in GC? --     Constitutional: Awake and alert. Eyes: Conjunctivae are normal. Head: Atraumatic. Nose: No congestion/rhinnorhea. Mouth/Throat: Mucous membranes are moist. Neck: Normal ROM Cardiovascular: Normal rate, regular rhythm. Grossly normal heart sounds. Respiratory: Normal respiratory effort.  No retractions. Lungs CTAB. Gastrointestinal: Soft and nontender. No distention. Genitourinary: deferred Musculoskeletal: No lower extremity tenderness nor edema. Neurologic: Complete aphasia noted.  Left-sided neglect with right arm shaking and held in your face.  Strength intact to bilateral lower extremities.  Increased tone noted. Skin:  Skin is warm, dry and intact. No rash noted. Psychiatric: Unable to assess.  ____________________________________________   LABS (all labs ordered are listed, but only abnormal results are displayed)  Labs Reviewed  COMPREHENSIVE METABOLIC PANEL - Abnormal; Notable for the following components:      Result Value   Glucose, Bld 131 (*)    All other components within normal limits  ETHANOL  PROTIME-INR  APTT  CBC  DIFFERENTIAL  URINE DRUG SCREEN, QUALITATIVE (  [facility] ONLY)  URINALYSIS, ROUTINE W REFLEX MICROSCOPIC   ____________________________________________  EKG  ED ECG REPORT I, Chesley Noon, the attending physician, personally viewed and interpreted this ECG.   Date: 10/30/2019  EKG Time: 15:21  Rate: 84  Rhythm: normal sinus rhythm  Axis: Normal  Intervals:none  ST&T Change: None    PROCEDURES  Procedure(s) performed (including Critical Care):  .Critical Care Performed by: Chesley Noon, MD Authorized by: Chesley Noon, MD   Critical care provider statement:    Critical care time (minutes):  45   Critical care time was exclusive of:  Separately billable  procedures and treating other patients and teaching time   Critical care was necessary to treat or prevent imminent or life-threatening deterioration of the following conditions:  CNS failure or compromise   Critical care was time spent personally by me on the following activities:  Discussions with consultants, evaluation of patient's response to treatment, examination of patient, ordering and performing treatments and interventions, ordering and review of laboratory studies, ordering and review of radiographic studies, pulse oximetry, re-evaluation of patient's condition, obtaining history from patient or surrogate and review of old charts   I assumed direction of critical care for this patient from another provider in my specialty: no   .1-3 Lead EKG Interpretation Performed by: Chesley Noon, MD Authorized by: Chesley Noon, MD     Interpretation: normal     ECG rate:  83   ECG rate assessment: normal     Rhythm: sinus rhythm     Ectopy: none     Conduction: normal       ____________________________________________   INITIAL IMPRESSION / ASSESSMENT AND PLAN / ED COURSE       74 year old male with history of stroke, not currently taking any medications, presents to the ED with stuttering aphasia since this morning which has now persisted since 2 PM.  On arrival, he is noted to be completely aphasic with left-sided neglect concerning for acute stroke.  Code stroke was called and noncontrast CT head is negative for acute process.  Dr. Loretha Brasil of neurology is at the bedside and given his increased tone with abnormal right upper extremity movements, differential includes partial seizure.  Given unclear picture, he recommends holding off on TPA for now and we will further assess for LVO with CT angiogram.  He will need to be transferred to [facility] ED regardless for continuous EEG monitoring.  Lab work is unremarkable.  Case discussed with Dr. Anitra Lauth in the [facility] emergency  department, who accepts patient for transfer.  Patient does seem to have had some response to Ativan and Keppra, now with decreased abnormal movements in his right upper extremity, although he continues to have aphasia and left-sided neglect.  Notified by radiology that patient has occlusion at the distal M1 and Dr. Anitra Lauth was notified of these results.  Dr. Loretha Brasil of neurology was also updated on CTA findings and will discuss with interventional radiology at [facility].  I spoke with Dr. Laurence Slate of neurology at [facility], who states patient will now be transferred directly to the IR suite for intervention on M1 occlusion.  [facility] was notified and will take patient directly to IR suite.     ____________________________________________   FINAL CLINICAL IMPRESSION(S) / ED DIAGNOSES  Final diagnoses:  Aphasia  Left-sided neglect     ED Discharge Orders    None       Note:  This document was prepared using Dragon voice recognition software and  may include unintentional dictation errors.   Blake Divine, MD 10/30/19 1637    Blake Divine, MD 10/30/19 [phone]

## 2019-10-30 NOTE — ED Notes (Signed)
[facility]  CALLED  FOR  TRANSFER  PER  DR  JESSUP  MD

## 2019-10-30 NOTE — Sedation Documentation (Signed)
Gauze/tegederm bandage applied to RIGHT femoral artery puncture site. Groin level 0, drsg CDI, pulses per flow sheet.

## 2019-10-30 NOTE — ED Notes (Signed)
Pt discharged to [facility] IR with [facility].

## 2019-10-30 NOTE — Sedation Documentation (Signed)
8Fr sheath removed for RIGHT femoral artery by Dr. Loreta Ave. Hemostasis achieved with Angioseal closure device. Groin level 0, RDP Doppler +, RPT Doppler +.

## 2019-10-30 NOTE — ED Triage Notes (Signed)
Pt in from home d/t TIA-like symptoms per wife. EMS called out to pt's house 3 times today d/t slurred speech per EMS. Once EMS got to scene pt would be speaking normally and refuse EMS. History of stroke. S/s kept coming back and finally pt agreed to come to ER. Pt currently alert, non-verbal. 190/100 BP, HR 92 NSR, 20 RR, 150 BG, 18g L ac per EMS.

## 2019-10-30 NOTE — Consult Note (Signed)
NAME:  Jeffery Reilly, MRN:  [id number], DOB:  1945-11-04, LOS: 0 ADMISSION DATE:  10/30/2019, CONSULTATION DATE:  10/30/19 REFERRING MD: Wilford Corner, CHIEF COMPLAINT:  aphasia  Brief History   74 year old man with hx of stroke, HTN, here with R M1 MCA stroke, s/p mechanical thrombectomy and stent on 5/18.   History of present illness    Presents with intermittent aphasia, waxing and waning on 5/18, finally persistent aphasia at around 2pm. Presented to [facility] ER.  Aphasic and L sided neglect on presentation to ED but alert.    On presentation, some RUE seizure like movements, increased tone.  Given ativan and Keppra, some decrease in abnormal movements but persistent aphasia and neglect.  CTA revealed R M1 MCA occlusion.  Transferred to [facility] for IR mechanical thrombectomy and stent.    Past Medical History  Hx L MCA CVA, r sided weakness HTN  Hx abdominal surgery  Significant Hospital Events   5/18 mechanical thrombectomy and stent R M1 MCA  Consults:  PCCM IR  Neurology primary  Procedures:  5/18 intubation for procedure  Significant Diagnostic Tests:    Micro Data:    Antimicrobials:    Interim history/subjective:    Objective   SpO2 100 %.    Vent Mode: PRVC FiO2 (%):  [60 %] 60 % Set Rate:  [14 bmp] 14 bmp Vt Set:  [580 mL] 580 mL PEEP:  [5 cmH20] 5 cmH20 Plateau Pressure:  [15 cmH20] 15 cmH20   Intake/Output Summary (Last 24 hours) at 10/30/2019 2109 Last data filed at 10/30/2019 1949 Gross per 24 hour  Intake 1000 ml  Output 250 ml  Net 750 ml   There were no vitals filed for this visit.  Examination: General: NAD, non responsive, sedated HENT: Perrl, mmm Lungs: CTAB Cardiovascular: bradycardic, no mgr Abdomen: NT, ND, NBS Extremities: B LE edema, chronic appearing skin changes.   Neuro: non responsive (sedated) GU:   Resolved Hospital Problem list     Assessment & Plan:  R M1 MCA stroke:  S/p mechanical thrombectomy, stent.  Post procedure  care.  Goal BP 120-140.  Standard vent settings for airway protection post procedure overnight.    Best practice:  Diet: npo  Pain/Anxiety/Delirium protocol (if indicated): prn   VAP protocol (if indicated):  DVT prophylaxis: scds  GI prophylaxis: na Glucose control: prn Mobility: bed Code Status: full Family Communication Disposition:   Labs   CBC: Recent Labs  Lab 10/30/19 1527  WBC 6.3  NEUTROABS 3.5  HGB 14.0  HCT 44.0  MCV 85.8  PLT 179    Basic Metabolic Panel: Recent Labs  Lab 10/30/19 1527  NA 138  K 3.9  CL 105  CO2 25  GLUCOSE 131*  BUN 16  CREATININE 0.87  CALCIUM 8.9   GFR: Estimated Creatinine Clearance: 87.5 mL/min (by C-G formula based on SCr of 0.87 mg/dL). Recent Labs  Lab 10/30/19 1527  WBC 6.3    Liver Function Tests: Recent Labs  Lab 10/30/19 1527  AST 23  ALT 31  ALKPHOS 81  BILITOT 0.8  PROT 7.4  ALBUMIN 3.7   No results for input(s): LIPASE, AMYLASE in the last 168 hours. No results for input(s): AMMONIA in the last 168 hours.  ABG No results found for: PHART, PCO2ART, PO2ART, HCO3, TCO2, ACIDBASEDEF, O2SAT   Coagulation Profile: Recent Labs  Lab 10/30/19 1527  INR 1.0    Cardiac Enzymes: No results for input(s): CKTOTAL, CKMB, CKMBINDEX, TROPONINI in the last 168  hours.  HbA1C: No results found for: HGBA1C  CBG: No results for input(s): GLUCAP in the last 168 hours.  Review of Systems:   Unable to assesss  Past Medical History  He,  has a past medical history of Stroke [facility]).   Surgical History    Past Surgical History:  Procedure Laterality Date  . ABDOMINAL SURGERY    . IR CT HEAD LTD  10/30/2019  . IR PERCUTANEOUS ART THROMBECTOMY/INFUSION INTRACRANIAL INC DIAG ANGIO  10/30/2019  . IR TRANSCATH/EMBOLIZ  10/30/2019  . IR US GUIDE VASC ACCESS RIGHT  10/30/2019     Social History   reports that he has quit smoking. He has never used smokeless tobacco. He reports that he does not drink alcohol or  use drugs.   Family History   His family history includes Hypertension in his father and mother.   Allergies No Known Allergies   Home Medications  Prior to Admission medications   Not on File     Critical care time: 40 min

## 2019-10-30 NOTE — ED Notes (Signed)
Pt back from CT

## 2019-10-30 NOTE — Procedures (Addendum)
Neuro-Interventional Radiology  Post Cerebral Angiogram Procedure Note  History:   74 yo male, presents with acute right MCA M1 occlusion.  ELVO, within 6 hours.    NIHSS:   13 ASPECTS:   10 Site of occlusion:  Right MCA M1  IV tPA administered?: no IA Medication:  Yes, 3mg IA integrilin  Final mTICI Score:  TICI 3   Device:    Pass 1:    ADAPT, with Zoom 071 catheter TICI 0 --> TICI 0 Pass 2:    Solitaire 4x40 with Zoom 071 intermediate catheter TICI 0 --> TICI 2a Pass 3:    ADAPT with Zoom 071 aspiration TICI 0 --> TICI 2a   Pass 4:    Embotrap 5 x 37 with Zoom 071 intermediate catheter TICI 0 --> TICI 2a  Rescue stent performed of right MCA M1 intracranial athero, with balloon inflatable DES, Onyx, 2.25mm x 15mm, 7 atm inflation.   TICI 3 achieved  Anesthesia    GETA  Procedure:  - US guided R CFA access - Cerebral Angiogram - Mechanical thrombectomy of ELVO involving right M1 - Rescue stent of the right M1 - Deployment of Angioseal for hemostasis - Flat panel CT in NIR suite  Findings:  Right M1 occlusion, recalcitrant to ADAPT and stent-retriever technique.  Intracranial athero at the occlusion, treated with DES balloon inflatable stent TICI 3 flow  Flat panel CT is negative for SAH.  Contrast stain in small areas of the right supra-ganglionic MCA territory.   Complications: None  EBL: 250cc  Recommendations: - right hip straight overnight - Goal SBP 120-140.   - Frequent NV checks - continue DAPT for right MCA rescue DES - Repeat CT or MRI imaging recommended within 36 hours, discretion of Neurology - NIR to follow  Signed,  Jaime S. Wagner, DO     

## 2019-10-30 NOTE — ED Notes (Signed)
Pt unable to sign consent for transfer due to condition. Pt's wife gave verbal consent to transfer via phone to Dr Larinda Buttery Md and this RN.

## 2019-10-30 NOTE — Sedation Documentation (Signed)
Pt arrived to [facility] suite from [facility] via [facility]. Report received from [facility] RN. Dr Loreta Ave at bedside

## 2019-10-30 NOTE — ED Notes (Signed)
CODE STROKE CALLED TO 333 

## 2019-10-30 NOTE — Transfer of Care (Signed)
Immediate Anesthesia Transfer of Care Note  Patient: Jeffery Reilly  Procedure(s) Performed: IR WITH ANESTHESIA (N/A )  Patient Location: NICU  Anesthesia Type:General  Level of Consciousness: Patient remains intubated per anesthesia plan  Airway & Oxygen Therapy: Patient remains intubated per anesthesia plan and Patient placed on Ventilator (see vital sign flow sheet for setting)  Post-op Assessment: Report given to RN and Post -op Vital signs reviewed and stable  Post vital signs: Reviewed and stable  Last Vitals:  Vitals Value Taken Time  BP 111/67 10/30/19 2030  Temp    Pulse 64 10/30/19 2033  Resp 28 10/30/19 2033  SpO2 100 % 10/30/19 2033  Vitals shown include unvalidated device data.  Last Pain: There were no vitals filed for this visit.       Complications: No apparent anesthesia complications

## 2019-10-30 NOTE — Anesthesia Preprocedure Evaluation (Signed)
Anesthesia Evaluation  Patient identified by MRN, date of birth, ID band Patient confused    Reviewed: Unable to perform ROS - Chart review onlyPreop documentation limited or incomplete due to emergent nature of procedure.  Airway Mallampati: III  TM Distance: >3 FB Neck ROM: Full    Dental  (+) Edentulous Upper, Edentulous Lower   Pulmonary former smoker,    breath sounds clear to auscultation       Cardiovascular  Rhythm:Regular     Neuro/Psych CVA, Residual Symptoms    GI/Hepatic   Endo/Other  Morbid obesity  Renal/GU      Musculoskeletal   Abdominal   Peds  Hematology   Anesthesia Other Findings Le edema  Reproductive/Obstetrics                             Anesthesia Physical Anesthesia Plan  ASA: III and emergent  Anesthesia Plan: General   Post-op Pain Management:    Induction: Intravenous, Rapid sequence and Cricoid pressure planned  PONV Risk Score and Plan: 2 and Treatment may vary due to age or medical condition  Airway Management Planned: Oral ETT  Additional Equipment: Arterial line  Intra-op Plan:   Post-operative Plan: Post-operative intubation/ventilation  Informed Consent:     Only emergency history available and History available from chart only  Plan Discussed with: CRNA and Surgeon  Anesthesia Plan Comments:         Anesthesia Quick Evaluation

## 2019-10-30 NOTE — Anesthesia Procedure Notes (Signed)
Procedure Name: Intubation Date/Time: 10/30/2019 5:45 PM Performed by: Janace Litten, CRNA Pre-anesthesia Checklist: Patient identified, Emergency Drugs available, Suction available and Patient being monitored Patient Re-evaluated:Patient Re-evaluated prior to induction Oxygen Delivery Method: Circle System Utilized Preoxygenation: Pre-oxygenation with 100% oxygen Induction Type: IV induction Ventilation: Mask ventilation without difficulty Laryngoscope Size: Mac and 4 Grade View: Grade I Tube type: Oral Tube size: 7.5 mm Number of attempts: 1 Airway Equipment and Method: Stylet and Oral airway Placement Confirmation: ETT inserted through vocal cords under direct vision,  positive ETCO2 and breath sounds checked- equal and bilateral Secured at: 21 cm Tube secured with: Tape Dental Injury: Teeth and Oropharynx as per pre-operative assessment

## 2019-10-30 NOTE — ED Notes (Signed)
Pt taken to CT for angio.

## 2019-10-30 NOTE — Sedation Documentation (Signed)
Groin and pulses assessed at bedside upon arrival to 4N18 with Charlynne Cousins, RN. Unchanged, see flowsheet.

## 2019-10-31 ENCOUNTER — Inpatient Hospital Stay (HOSPITAL_COMMUNITY): Payer: Medicare Other

## 2019-10-31 ENCOUNTER — Encounter: Payer: Self-pay | Admitting: *Deleted

## 2019-10-31 DIAGNOSIS — I161 Hypertensive emergency: Secondary | ICD-10-CM

## 2019-10-31 DIAGNOSIS — R1312 Dysphagia, oropharyngeal phase: Secondary | ICD-10-CM

## 2019-10-31 DIAGNOSIS — E782 Mixed hyperlipidemia: Secondary | ICD-10-CM

## 2019-10-31 DIAGNOSIS — E1165 Type 2 diabetes mellitus with hyperglycemia: Secondary | ICD-10-CM

## 2019-10-31 DIAGNOSIS — Z8673 Personal history of transient ischemic attack (TIA), and cerebral infarction without residual deficits: Secondary | ICD-10-CM

## 2019-10-31 DIAGNOSIS — R569 Unspecified convulsions: Secondary | ICD-10-CM

## 2019-10-31 DIAGNOSIS — J9601 Acute respiratory failure with hypoxia: Secondary | ICD-10-CM

## 2019-10-31 DIAGNOSIS — R4701 Aphasia: Secondary | ICD-10-CM

## 2019-10-31 LAB — RAPID URINE DRUG SCREEN, HOSP PERFORMED
Amphetamines: NOT DETECTED
Barbiturates: NOT DETECTED
Benzodiazepines: NOT DETECTED
Cocaine: NOT DETECTED
Opiates: NOT DETECTED
Tetrahydrocannabinol: NOT DETECTED

## 2019-10-31 LAB — GLUCOSE, CAPILLARY
Glucose-Capillary: 132 mg/dL — ABNORMAL HIGH (ref 70–99)
Glucose-Capillary: 132 mg/dL — ABNORMAL HIGH (ref 70–99)
Glucose-Capillary: 146 mg/dL — ABNORMAL HIGH (ref 70–99)
Glucose-Capillary: 159 mg/dL — ABNORMAL HIGH (ref 70–99)
Glucose-Capillary: 165 mg/dL — ABNORMAL HIGH (ref 70–99)
Glucose-Capillary: 168 mg/dL — ABNORMAL HIGH (ref 70–99)

## 2019-10-31 LAB — LIPID PANEL
Cholesterol: 249 mg/dL — ABNORMAL HIGH (ref 0–200)
HDL: 43 mg/dL (ref >40)
LDL Cholesterol: 143 mg/dL — ABNORMAL HIGH (ref 0–99)
Total CHOL/HDL Ratio: 5.8 RATIO
Triglycerides: 316 mg/dL — ABNORMAL HIGH (ref <150)
VLDL: 63 mg/dL — ABNORMAL HIGH (ref 0–40)

## 2019-10-31 LAB — HEMOGLOBIN A1C
Hgb A1c MFr Bld: 7.4 % — ABNORMAL HIGH (ref 4.8–5.6)
Mean Plasma Glucose: 166 mg/dL

## 2019-10-31 MED ORDER — ATORVASTATIN CALCIUM 80 MG PO TABS
80.0000 mg | ORAL_TABLET | Freq: Every day | ORAL | Status: DC
  Administered 2019-10-31 – 2019-11-01 (×2): 80 mg
  Filled 2019-10-31 (×2): qty 1

## 2019-10-31 MED ORDER — ORAL CARE MOUTH RINSE: 15.0000 mL | OROMUCOSAL | Status: DC

## 2019-10-31 MED ORDER — LABETALOL HCL 5 MG/ML IV SOLN
0.5000 mg/min | INTRAVENOUS | Status: DC
  Filled 2019-10-31: qty 100

## 2019-10-31 MED ORDER — AMLODIPINE BESYLATE 10 MG PO TABS
10.0000 mg | ORAL_TABLET | Freq: Every day | ORAL | Status: DC
  Administered 2019-10-31 – 2019-11-01 (×2): 10 mg
  Filled 2019-10-31 (×2): qty 1

## 2019-10-31 MED ORDER — CHLORHEXIDINE GLUCONATE 0.12% ORAL RINSE (MEDLINE KIT)
15.0000 mL | Freq: Two times a day (BID) | OROMUCOSAL | Status: DC
  Administered 2019-10-31 – 2019-11-02 (×4): 15 mL via OROMUCOSAL

## 2019-10-31 MED ORDER — LISINOPRIL 20 MG PO TABS
40.0000 mg | ORAL_TABLET | Freq: Every day | ORAL | Status: DC
  Administered 2019-10-31 – 2019-11-01 (×2): 40 mg
  Filled 2019-10-31 (×2): qty 2

## 2019-10-31 MED ORDER — SENNOSIDES-DOCUSATE SODIUM 8.6-50 MG PO TABS: 1.0000 | ORAL_TABLET | Freq: Every evening | ORAL | Status: DC | PRN

## 2019-10-31 MED ORDER — ATORVASTATIN CALCIUM 80 MG PO TABS: 80.0000 mg | ORAL_TABLET | Freq: Every day | ORAL | Status: DC

## 2019-10-31 MED ORDER — LEVETIRACETAM IN NACL 500 MG/100ML IV SOLN
500.0000 mg | Freq: Two times a day (BID) | INTRAVENOUS | Status: DC
  Administered 2019-10-31 – 2019-11-01 (×2): 500 mg via INTRAVENOUS
  Filled 2019-10-31 (×2): qty 100

## 2019-10-31 MED ORDER — CHLORHEXIDINE GLUCONATE CLOTH 2 % EX PADS
6.0000 | MEDICATED_PAD | Freq: Every day | CUTANEOUS | Status: DC
  Administered 2019-10-31 – 2019-11-03 (×4): 6 via TOPICAL

## 2019-10-31 NOTE — Evaluation (Signed)
Physical Therapy Evaluation Patient Details Name: Jeffery Reilly MRN: [id number] DOB: 27-Dec-1945 Today's Date: 10/31/2019   History of Present Illness  74 y.o. male past medical history of mild residual right hemiparesis from a left cerebral hemispheric stroke  Patient presented to [facility] for confusion/aphasia. Pt with aphasia, R gaze deviation and L weakness with possible neglect. CTA head/neck demonstrating R MCA occlusion. Pt underwent R MCA stenting on 5/18. Pt remains intubated after procedure.  Clinical Impression  Pt presents to PT with deficits in functional mobility, gait, balance, endurance, strength, power, communication, and cognition. Pt currently demonstrates weakness in all extremities with prior R hemiplegia from CVA and new L weakness from R MCA this admission. Pt unable to advance RLE in standing and requires maxA of PT weight shift and facilitation to pivot safely to recliner. Pt also difficult to understand due to dysarthria and wet voice quality after extubation, limiting communication at times. Pt also demonstrates R gaze preference but is able to turn head and track to left when requested. Pt will benefit from continued acute PT POC to improve mobility quality and reduce falls risk. PT currently recommending CIR as the pt was independent prior to admission and demonstrates the potential to make significant functional gains with high intensity therapies.    Follow Up Recommendations CIR;Supervision/Assistance - 24 hour    Equipment Recommendations  Wheelchair (measurements PT);Wheelchair cushion (measurements PT)(mechanical lift if home today)    Recommendations for Other Services Rehab consult     Precautions / Restrictions Precautions Precautions: Fall Restrictions Weight Bearing Restrictions: No      Mobility  Bed Mobility Overal bed mobility: Needs Assistance Bed Mobility: Supine to Sit     Supine to sit: Mod assist        Transfers Overall  transfer level: Needs assistance Equipment used: 1 person hand held assist Transfers: Sit to/from Stand;Stand Pivot Transfers Sit to Stand: Mod assist Stand pivot transfers: Max assist       General transfer comment: pt with R knee extension, unable to advance RLE, PT providing significant weight shift and facilitation of pivot to aide in transfer  Ambulation/Gait                Stairs            Wheelchair Mobility    Modified Rankin (Stroke Patients Only) Modified Rankin (Stroke Patients Only) Pre-Morbid Rankin Score: Slight disability Modified Rankin: Moderately severe disability     Balance Overall balance assessment: Needs assistance Sitting-balance support: Feet supported;Bilateral upper extremity supported Sitting balance-Leahy Scale: Fair Sitting balance - Comments: minG-minA, left lean Postural control: Left lateral lean Standing balance support: Bilateral upper extremity supported Standing balance-Leahy Scale: Poor Standing balance comment: modA to maintain static standing                             Pertinent Vitals/Pain Pain Assessment: No/denies pain    Home Living Family/patient expects to be discharged to:: Private residence Living Arrangements: Spouse/significant other Available Help at Discharge: Family;Available 24 hours/day Type of Home: House Home Access: Stairs to enter Entrance Stairs-Rails: Can reach both Entrance Stairs-Number of Steps: 4 Home Layout: One level Home Equipment: Cane - single point;Shower seat      Prior Function Level of Independence: Independent         Comments: pt ambulates household and limited community distances, raises hogs     Hand Dominance   Dominant Hand: Right  Extremity/Trunk Assessment   Upper Extremity Assessment Upper Extremity Assessment: Generalized weakness(grossly 4-/5 BUE)    Lower Extremity Assessment Lower Extremity Assessment: Generalized weakness(grossly 3/5  hip flexors, 4-/5 knee extensors, 4/5 PF/DF)    Cervical / Trunk Assessment Cervical / Trunk Assessment: Normal  Communication   Communication: Expressive difficulties(slurred speech, wet voice quality)  Cognition Arousal/Alertness: Awake/alert Behavior During Therapy: WFL for tasks assessed/performed Overall Cognitive Status: Impaired/Different from baseline Area of Impairment: Orientation;Awareness;Safety/judgement;Problem solving                 Orientation Level: Disoriented to;Place;Time(pt thinks he is still at [facility])       Safety/Judgement: Decreased awareness of safety;Decreased awareness of deficits Awareness: Intellectual Problem Solving: Slow processing;Requires verbal cues        General Comments General comments (skin integrity, edema, etc.): pt on 4L [place], VSS during session. Pt is very difficult to understand due to dysarthria and wet voice quality after extubation, PT obtaining most history from pt's spouse    Exercises     Assessment/Plan    PT Assessment Patient needs continued PT services  PT Problem List Decreased strength;Decreased activity tolerance;Decreased balance;Decreased mobility;Decreased coordination;Decreased cognition;Decreased knowledge of use of DME;Decreased safety awareness;Decreased knowledge of precautions;Cardiopulmonary status limiting activity       PT Treatment Interventions DME instruction;Gait training;Stair training;Functional mobility training;Therapeutic activities;Therapeutic exercise;Balance training;Neuromuscular re-education;Cognitive remediation;Patient/family education    PT Goals (Current goals can be found in the Care Plan section)  Acute Rehab PT Goals Patient Stated Goal: To return to prior level of function PT Goal Formulation: With patient Time For Goal Achievement: 11/14/19 Potential to Achieve Goals: Good Additional Goals Additional Goal #1: Pt will maintain dynamic standing balance within 10 inches of his  base of support with unilateral UE support and minG.    Frequency Min 4X/week   Barriers to discharge        Co-evaluation               AM-PAC PT "6 Clicks" Mobility  Outcome Measure Help needed turning from your back to your side while in a flat bed without using bedrails?: A Lot Help needed moving from lying on your back to sitting on the side of a flat bed without using bedrails?: A Lot Help needed moving to and from a bed to a chair (including a wheelchair)?: Total Help needed standing up from a chair using your arms (e.g., wheelchair or bedside chair)?: A Lot Help needed to walk in hospital room?: Total Help needed climbing 3-5 steps with a railing? : Total 6 Click Score: 9    End of Session Equipment Utilized During Treatment: Oxygen Activity Tolerance: Patient tolerated treatment well Patient left: in chair;with call bell/phone within reach;with chair alarm set;with family/visitor present Nurse Communication: Need for lift equipment;Mobility status(STEDY) PT Visit Diagnosis: Unsteadiness on feet (R26.81);Other abnormalities of gait and mobility (R26.89);Muscle weakness (generalized) (M62.81);Other symptoms and signs involving the nervous system (R29.898)    Time: 9643-8381 PT Time Calculation (min) (ACUTE ONLY): 38 min   Charges:   PT Evaluation $PT Eval Moderate Complexity: 1 Mod PT Treatments $Therapeutic Activity: 8-22 mins        Arlyss Gandy, PT, DPT Acute Rehabilitation Pager: [phone]   Arlyss Gandy 10/31/2019, 4:43 PM

## 2019-10-31 NOTE — TOC CAGE-AID Note (Signed)
Transition of Care [facility]) - CAGE-AID Screening   Patient Details  Name: Jeffery Reilly MRN: [id number] Date of Birth: 1946/05/07  Transition of Care [facility]) CM/SW Contact:    Jimmy Picket, [place] Phone Number: 10/31/2019, 2:26 PM   Clinical Narrative:  Pt was unable to participate in assessment due to being on vent.   CAGE-AID Screening: Substance Abuse Screening unable to be completed due to: : Patient unable to participate            Isabella Stalling Clinical Social Worker [phone]

## 2019-10-31 NOTE — Progress Notes (Signed)
EEG Completed; Results Pending  

## 2019-10-31 NOTE — Procedures (Signed)
Extubation Procedure Note  Patient Details:   Name: Jeffery Reilly DOB: 1946/06/10 MRN: [id number]   Airway Documentation:    Vent end date: 10/31/19 Vent end time: 1126   Evaluation  O2 sats: stable throughout Complications: No apparent complications Patient did tolerate procedure well. Bilateral Breath Sounds: Clear   Yes   RT extubated patient to 4L [place] per MD order with RN at bedside. Patient did not have a cuff leak and RT made MD aware. MD said to continue with extubation. Patient tolerated extubation well. No stridor at this time. RT will continue to monitor as needed.   Lura Em 10/31/2019, 11:28 AM

## 2019-10-31 NOTE — Progress Notes (Signed)
OT Cancellation Note  Patient Details Name: Jeffery Reilly MRN: [id number] DOB: [birth year]   Cancelled Treatment:    Reason Eval/Treat Not Completed: (P) Patient at procedure or test/ unavailable(MRI)  Lurlean Kernen,HILLARY 10/31/2019, 2:08 PM  Luisa Dago, OT/L   Acute OT Clinical Specialist Acute Rehabilitation Services Pager [phone] Office [phone]

## 2019-10-31 NOTE — Progress Notes (Signed)
STROKE TEAM PROGRESS NOTE   INTERVAL HISTORY RN, EEG tech and Dr. Denese Killings are at bedside. Pt still intubated but following commands, slight weakness on the left comparing with right. EEG ongoing, no seizure seen. Plan to extubate soon and then MRI and MRA. On ASA and plavix.   Vitals:   10/31/19 0813 10/31/19 0818 10/31/19 0824 10/31/19 0845  BP:   (!) 137/56 128/77  Pulse: 79 76 80 87  Resp: 16 15 15 15   Temp:      TempSrc:      SpO2: 100% 100%  100%   CBC:  Recent Labs  Lab 10/30/19 1527 10/30/19 2134  WBC 6.3  --   NEUTROABS 3.5  --   HGB 14.0 11.6*  HCT 44.0 34.0*  MCV 85.8  --   PLT 179  --    Basic Metabolic Panel:  Recent Labs  Lab 10/30/19 1527 10/30/19 2134  NA 138 138  K 3.9 3.7  CL 105  --   CO2 25  --   GLUCOSE 131*  --   BUN 16  --   CREATININE 0.87  --   CALCIUM 8.9  --    Lipid Panel:     Component Value Date/Time   CHOL 249 (H) 10/31/2019 0551   TRIG 316 (H) 10/31/2019 0551   HDL 43 10/31/2019 0551   CHOLHDL 5.8 10/31/2019 0551   VLDL 63 (H) 10/31/2019 0551   LDLCALC 143 (H) 10/31/2019 0551   HgbA1c: No results found for: HGBA1C Urine Drug Screen: No results found for: LABOPIA, COCAINSCRNUR, LABBENZ, AMPHETMU, THCU, LABBARB  Alcohol Level     Component Value Date/Time   ETH <10 10/30/2019 1527    IMAGING past 24 hours CT Angio Head W or Wo Contrast  Result Date: 10/30/2019 CLINICAL DATA:  Code stroke follow-up EXAM: CT ANGIOGRAPHY HEAD AND NECK TECHNIQUE: Multidetector CT imaging of the head and neck was performed using the standard protocol during bolus administration of intravenous contrast. Multiplanar CT image reconstructions and MIPs were obtained to evaluate the vascular anatomy. Carotid stenosis measurements (when applicable) are obtained utilizing NASCET criteria, using the distal internal carotid diameter as the denominator. CONTRAST:  75mL OMNIPAQUE IOHEXOL 350 MG/ML SOLN COMPARISON:  Earlier same day FINDINGS: CT HEAD FINDINGS  Brain: There is no acute intracranial hemorrhage, mass effect, or edema. No new loss of gray-white differentiation. Chronic infarctions and chronic microvascular ischemic changes as described previously. Vascular: No new finding Skull: No new finding Sinuses: No new finding Orbits: No new finding Review of the MIP images confirms the above findings CTA NECK FINDINGS Aortic arch: Great vessel origins are patent. There is direct origin of the left vertebral artery from the arch. Right carotid system: Patent. No measurable stenosis at the ICA origin. Retropharyngeal course of the ICA. Left carotid system: Patent. No measurable stenosis at the ICA origin. Retropharyngeal course of the ICA. Vertebral arteries: Patent and codominant. Skeleton: Multilevel degenerative changes of the cervical spine. Other neck: No mass or adenopathy. Upper chest: No apical lung mass. Review of the MIP images confirms the above findings CTA HEAD FINDINGS Anterior circulation: Intracranial internal carotid arteries are patent. There is occlusion of the distal right M1 MCA. Diminished flow within the more distal right MCA territory. Anterior and left middle cerebral arteries are patent. Posterior circulation: Intracranial vertebral arteries are patent. Basilar artery is patent with focal short segment moderate stenosis including the level of left AICA origin. Posterior cerebral arteries are patent. There is a patent  right posterior communicating artery. Venous sinuses: Not well evaluated on this study. Review of the MIP images confirms the above findings IMPRESSION: Distal right M1 MCA occlusion. Partial reconstitution of more distal right MCA territory. No acute intracranial hemorrhage.  ASPECT score remains 10. No hemodynamically significant stenosis in the neck. These results were called by telephone at the time of interpretation on 10/30/2019 at 4:28 pm to provider [facility] , who verbally acknowledged these results. Electronically  Signed   By: Guadlupe Spanish M.D.   On: 10/30/2019 16:32   CT Angio Neck W and/or Wo Contrast  Result Date: 10/30/2019 CLINICAL DATA:  Code stroke follow-up EXAM: CT ANGIOGRAPHY HEAD AND NECK TECHNIQUE: Multidetector CT imaging of the head and neck was performed using the standard protocol during bolus administration of intravenous contrast. Multiplanar CT image reconstructions and MIPs were obtained to evaluate the vascular anatomy. Carotid stenosis measurements (when applicable) are obtained utilizing NASCET criteria, using the distal internal carotid diameter as the denominator. CONTRAST:  75mL OMNIPAQUE IOHEXOL 350 MG/ML SOLN COMPARISON:  Earlier same day FINDINGS: CT HEAD FINDINGS Brain: There is no acute intracranial hemorrhage, mass effect, or edema. No new loss of gray-white differentiation. Chronic infarctions and chronic microvascular ischemic changes as described previously. Vascular: No new finding Skull: No new finding Sinuses: No new finding Orbits: No new finding Review of the MIP images confirms the above findings CTA NECK FINDINGS Aortic arch: Great vessel origins are patent. There is direct origin of the left vertebral artery from the arch. Right carotid system: Patent. No measurable stenosis at the ICA origin. Retropharyngeal course of the ICA. Left carotid system: Patent. No measurable stenosis at the ICA origin. Retropharyngeal course of the ICA. Vertebral arteries: Patent and codominant. Skeleton: Multilevel degenerative changes of the cervical spine. Other neck: No mass or adenopathy. Upper chest: No apical lung mass. Review of the MIP images confirms the above findings CTA HEAD FINDINGS Anterior circulation: Intracranial internal carotid arteries are patent. There is occlusion of the distal right M1 MCA. Diminished flow within the more distal right MCA territory. Anterior and left middle cerebral arteries are patent. Posterior circulation: Intracranial vertebral arteries are patent.  Basilar artery is patent with focal short segment moderate stenosis including the level of left AICA origin. Posterior cerebral arteries are patent. There is a patent right posterior communicating artery. Venous sinuses: Not well evaluated on this study. Review of the MIP images confirms the above findings IMPRESSION: Distal right M1 MCA occlusion. Partial reconstitution of more distal right MCA territory. No acute intracranial hemorrhage.  ASPECT score remains 10. No hemodynamically significant stenosis in the neck. These results were called by telephone at the time of interpretation on 10/30/2019 at 4:28 pm to provider [facility] , who verbally acknowledged these results. Electronically Signed   By: Guadlupe Spanish M.D.   On: 10/30/2019 16:32   IR Transcath/Emboliz  Result Date: 10/30/2019 INDICATION: 74 year old male with acute stroke, right MCA M1 occlusion EXAM: ULTRASOUND GUIDED ACCESS RIGHT COMMON FEMORAL ARTERY CERVICAL AND CEREBRAL ANGIOGRAM MECHANICAL THROMBECTOMY RIGHT MCA/M1 RESCUE STENT OF RIGHT MCA INTRACRANIAL ATHEROSCLEROSIS ANGIO-SEAL FOR CLOSURE COMPARISON:  CT imaging same day MEDICATIONS: 3 mg intra arterial integral and, 650 mg aspirin OG, 300 mg Plavix OG ANESTHESIA/SEDATION: The anesthesia team was present to provide general endotracheal tube anesthesia and for patient monitoring during the procedure. Intubation was performed in negative pressure [place] in neuro IR holding. Interventional neuro radiology nursing staff was also present. CONTRAST:  170 cc Omnipaque 300 FLUOROSCOPY TIME:  Fluoroscopy Time: 40 minutes 42 seconds (3,316 mGy). COMPLICATIONS: None TECHNIQUE: Informed written consent was obtained from the patient's family after a thorough discussion of the procedural risks, benefits and alternatives. Specific risks discussed include: Bleeding, infection, contrast reaction, kidney injury/failure, need for further procedure/surgery, arterial injury or dissection, embolization to new  territory, intracranial hemorrhage (10-15% risk), neurologic deterioration, cardiopulmonary collapse, death. All questions were addressed. Maximal Sterile Barrier Technique was utilized including during the procedure including caps, mask, sterile gowns, sterile gloves, sterile drape, hand hygiene and skin antiseptic. A timeout was performed prior to the initiation of the procedure. The anesthesia team was present to provide general endotracheal tube anesthesia and for patient monitoring during the procedure. Interventional neuro radiology nursing staff was also present. FINDINGS: Initial Findings: Right internal carotid artery: Tortuosity of the cervical segment. Vertical and petrous segment patent with normal course caliber contour. Cavernous segment patent. Clinoid segment patent. Antegrade flow of the ophthalmic artery. Ophthalmic segment patent. Terminus patent. Right MCA: Proximal M1 segment is patent. There is a tapered cuff-off of the mid segment of the M1, in the region the mid orbit. Proximal temporal branch remains patent. TICI 0 flow through the M1 segment. There late filling with collaterals via the right anterior cerebral artery and the right posterior cerebral artery via leptomeningeal collateral vessels. Patent anterior communicating artery with the temporal region supplied by the PCA. Right ACA: A 1 segment patent. A 2 segment perfuses the right territory. Patent anterior communicating artery with significant cross-filling of the left anterior cerebral artery. Completion Findings: During the mechanical thrombectomy, the initial 2 passes resulted in TICI 2B flow, with partial filling of greater than 50% of the right-sided hemispheric MCA branches. The cortical branches after the first 2 passes are filling both from leptomeningeal collaterals as well as through the attenuated M1 segment. The final pass with the EMBO trapped device uncovered atherosclerotic plaque at the distal M1 segment. This  atherosclerotic plaque was then treated with drug-eluting balloon mounted stent. Right MCA: TICI 3 flow was restored through the right hemisphere. The stent was successful in restoring flow through the disease segment of the M1 Right common carotid artery:  Normal course caliber and contour. Right external carotid artery: Patent with antegrade flow. Flat panel CT demonstrates minimal staining within the supra ganglionic right MCA territory with no subarachnoid hemorrhage or large parenchymal hemorrhage. PROCEDURE: Patient was brought to the [facility] suite, and identity was confirmed. Patient was prepped and draped in the usual sterile fashion. Ultrasound survey of the right inguinal region was performed with images stored and sent to PACs. 11 blade scalpel was used to make a small incision. Blunt dissection was performed with US guidance. A micropuncture needle was used access the right common femoral artery under ultrasound. With excellent arterial blood flow returned, an .018 micro wire was passed through the needle, observed to enter the abdominal aorta under fluoroscopy. The needle was removed, and a micropuncture sheath was placed over the wire. The inner dilator and wire were removed, and an 035 wire was advanced under fluoroscopy into the abdominal aorta. The sheath was removed and a 25cm 67F straight vascular sheath was placed. The dilator was removed and the sheath was flushed. Limited angiogram was performed. Sheath was attached to pressurized and heparinized saline bag for constant forward flow. A coaxial system was then advanced over the 035 wire. This included a 110 cm Zoom 088 catheter with coaxial 125cm Berenstein diagnostic catheter. This was advanced to the proximal descending thoracic aorta.  Wire was then removed. Double flush of the catheter was performed. Catheter was then used to select the innominate artery and the common carotid artery. The catheter combination was then navigated into the cervical  ICA segment. The Berenstein catheter and the Glidewire were removed. Angiogram was performed. Road map function was used once the occluded vessel was identified. Copious back flush was performed and the 088 catheter was attached to heparinized and pressurized saline bag for forward flow. A second coaxial system was then advanced through the balloon catheter, which included the selected intermediate catheter, microcatheter, and microwire. In this scenario, the set up included a 137 cm 071 Zoom catheter, a Trevo Provue18 microcatheter, and 014 synchro soft wire. This system was advanced through the guide catheter under the road-map function, with adequate back-flush at the rotating hemostatic valve at that back end of the balloon guide. Microcatheter and the intermediate catheter system were advanced through the terminal ICA and MCA to the level of the occlusion. The micro wire in the microcatheter were then removed. These 071 Zoom catheter was then advanced to the face of the occlusion for the first pass attempt, adapt technique. Catheter was withdrawn while at the same time the 088 distal access catheter was advanced through the carotid siphon to the terminus. Angiogram confirmed persistent occlusion. The 071 catheter was then readvanced through the distal access catheter with a coaxial microwire and microcatheter. Microcatheter and the microwire were then gently navigated through the occlusion into angular branch of the MCA. Microcatheter was advanced through the occlusion. Wire was removed. Blood was then aspirated through the hub of the microcatheter, and a gentle contrast injection was performed confirming intraluminal position. A rotating hemostatic valve was then attached to the back end of the microcatheter, and a pressurized and heparinized saline bag was attached to the catheter. 4 x 40 solitaire device was then selected. Back flush was achieved at the rotating hemostatic valve, and then the device was  gently advanced through the microcatheter to the distal end. The retriever was then unsheathed by withdrawing the microcatheter under fluoroscopy. Once the retriever was completely unsheathed, the microcatheter was carefully stripped from the delivery device. Control angiogram was performed from the intermediate catheter. A 3 minute time interval was observed. Solitaire stent was then withdrawn to the 071 catheter under fluoroscopy. Once the retriever was "corked" within the tip of the intermediate catheter, both were removed from the system. Free aspiration was confirmed at the hub of the 088 distal access catheter, with free blood return confirmed. Control angiogram was performed. Partial occlusion remained with TICI 2b flow at this point. We then advanced the zoom catheter with the microwire and microcatheter combination for a third pass attempt, using the adapt technique. 071 catheter was gently advanced to the face of the clot, and aspiration was performed. Once the catheter was withdrawn into the base 088 distal guide catheter, aspiration was performed at the hub of the 088 catheter. Angiogram was performed. Partial occlusion remained with TICI 2b flow at this point. We then proceeded with a fourth pass. The 071 catheter was then readvanced through the distal access catheter with a coaxial microwire and microcatheter. Microcatheter and the microwire were then gently navigated to the face of the occlusion. The synchro soft wire would not advance through the occlusion. A headliner J wire 012 was then used through the microcatheter, successful in passing through the occlusion into the angular branch. Microcatheter was advanced through the occlusion. Wire was removed. Blood was then aspirated  through the hub of the microcatheter, and a gentle contrast injection was performed confirming intraluminal position. A rotating hemostatic valve was then attached to the back end of the microcatheter, and a pressurized and  heparinized saline bag was attached to the catheter. 5 mm by 37 mm EMBO trapped was then selected. Back flush was achieved at the rotating hemostatic valve, and then the device was gently advanced through the microcatheter to the distal end. The retriever was then unsheathed by withdrawing the microcatheter under fluoroscopy. Once the retriever was completely unsheathed, the microcatheter was carefully stripped from the delivery device. Control angiogram was performed from the intermediate catheter. This control angiogram confirmed flow through the EMBO trap, with a lesion at the site of the occlusion, representing native intracranial atherosclerosis. While the stent tree vert remained open for proximally 5 minutes, we had the anesthesia team pass and OG tube. We administered 650 mg of aspirin. Estimation of the diameter length of the lesion were performed. We selected a drug-eluting balloon mounted stent, 2.25 mm x 15 mm. After approximately 5 minutes, the stent tree vert was removed. Angiogram was performed confirming TICI TB flow, relatively unchanged. We then navigated the microcatheter and the microwire/synchro wire back through the 088 distal access catheter. This combination was successful in crossing the stenosis/lesion in the distal M1. The catheter micro wire were placed into the angular branch and the wire was removed. Gentle injection confirmed location. A transcend wire was then placed through the microcatheter and the microcatheter was removed. A baseline angiogram was performed. We then deployed a balloon mounted drug-eluting stent. We selected a Onyx 2.43mm x 15mm stent. This was deployed with 7 atmosphere inflation. Balloon was deflated and withdrawn. Repeat angiogram was performed. TICI 3 was achieved. Wires and catheters were removed. The distal access catheter was withdrawn to the cervical ICA and repeat angiogram was performed of the cervical segment. The 088 catheter was then withdrawn. The  skin at the puncture site was then cleaned with Chlorhexidine. The 8 French sheath was removed and an 39F angioseal was deployed. Flat panel CT was performed. Patient tolerated the procedure well and remained hemodynamically stable throughout. No complications were encountered and no significant blood loss encountered. IMPRESSION: Status post ultrasound guided access right common femoral artery for cervical and cerebral angiogram, mechanical thrombectomy of right M1 occlusion, and rescue stenting of native intracranial atherosclerotic permissive lesion with 2.25 mm x 15 mm drug-eluting stent, restoring TICI 3 flow. Angio-Seal for hemostasis. Signed, Yvone Neu. Reyne Dumas, RPVI [facility] Radiology PLAN: The patient will remain intubated. ICU status Target systolic blood pressure of 120-140 Right hip straight time 6 hours Frequent neurovascular checks Repeat neurologic imaging with CT and/MRI at the discretion of neurology team Dual anti-platelet therapy. Electronically Signed   By: Gilmer Mor D.O.   On: 10/30/2019 20:58   IR CT Head Ltd  Result Date: 10/30/2019 INDICATION: 74 year old male with acute stroke, right MCA M1 occlusion EXAM: ULTRASOUND GUIDED ACCESS RIGHT COMMON FEMORAL ARTERY CERVICAL AND CEREBRAL ANGIOGRAM MECHANICAL THROMBECTOMY RIGHT MCA/M1 RESCUE STENT OF RIGHT MCA INTRACRANIAL ATHEROSCLEROSIS ANGIO-SEAL FOR CLOSURE COMPARISON:  CT imaging same day MEDICATIONS: 3 mg intra arterial integral and, 650 mg aspirin OG, 300 mg Plavix OG ANESTHESIA/SEDATION: The anesthesia team was present to provide general endotracheal tube anesthesia and for patient monitoring during the procedure. Intubation was performed in negative pressure [place] in neuro IR holding. Interventional neuro radiology nursing staff was also present. CONTRAST:  170 cc Omnipaque  300 FLUOROSCOPY TIME:  Fluoroscopy Time: 40 minutes 42 seconds (3,316 mGy). COMPLICATIONS: None TECHNIQUE:  Informed written consent was obtained from the patient's family after a thorough discussion of the procedural risks, benefits and alternatives. Specific risks discussed include: Bleeding, infection, contrast reaction, kidney injury/failure, need for further procedure/surgery, arterial injury or dissection, embolization to new territory, intracranial hemorrhage (10-15% risk), neurologic deterioration, cardiopulmonary collapse, death. All questions were addressed. Maximal Sterile Barrier Technique was utilized including during the procedure including caps, mask, sterile gowns, sterile gloves, sterile drape, hand hygiene and skin antiseptic. A timeout was performed prior to the initiation of the procedure. The anesthesia team was present to provide general endotracheal tube anesthesia and for patient monitoring during the procedure. Interventional neuro radiology nursing staff was also present. FINDINGS: Initial Findings: Right internal carotid artery: Tortuosity of the cervical segment. Vertical and petrous segment patent with normal course caliber contour. Cavernous segment patent. Clinoid segment patent. Antegrade flow of the ophthalmic artery. Ophthalmic segment patent. Terminus patent. Right MCA: Proximal M1 segment is patent. There is a tapered cuff-off of the mid segment of the M1, in the region the mid orbit. Proximal temporal branch remains patent. TICI 0 flow through the M1 segment. There late filling with collaterals via the right anterior cerebral artery and the right posterior cerebral artery via leptomeningeal collateral vessels. Patent anterior communicating artery with the temporal region supplied by the PCA. Right ACA: A 1 segment patent. A 2 segment perfuses the right territory. Patent anterior communicating artery with significant cross-filling of the left anterior cerebral artery. Completion Findings: During the mechanical thrombectomy, the initial 2 passes resulted in TICI 2B flow, with partial  filling of greater than 50% of the right-sided hemispheric MCA branches. The cortical branches after the first 2 passes are filling both from leptomeningeal collaterals as well as through the attenuated M1 segment. The final pass with the EMBO trapped device uncovered atherosclerotic plaque at the distal M1 segment. This atherosclerotic plaque was then treated with drug-eluting balloon mounted stent. Right MCA: TICI 3 flow was restored through the right hemisphere. The stent was successful in restoring flow through the disease segment of the M1 Right common carotid artery:  Normal course caliber and contour. Right external carotid artery: Patent with antegrade flow. Flat panel CT demonstrates minimal staining within the supra ganglionic right MCA territory with no subarachnoid hemorrhage or large parenchymal hemorrhage. PROCEDURE: Patient was brought to the [facility] suite, and identity was confirmed. Patient was prepped and draped in the usual sterile fashion. Ultrasound survey of the right inguinal region was performed with images stored and sent to PACs. 11 blade scalpel was used to make a small incision. Blunt dissection was performed with US guidance. A micropuncture needle was used access the right common femoral artery under ultrasound. With excellent arterial blood flow returned, an .018 micro wire was passed through the needle, observed to enter the abdominal aorta under fluoroscopy. The needle was removed, and a micropuncture sheath was placed over the wire. The inner dilator and wire were removed, and an 035 wire was advanced under fluoroscopy into the abdominal aorta. The sheath was removed and a 25cm 71F straight vascular sheath was placed. The dilator was removed and the sheath was flushed. Limited angiogram was performed. Sheath was attached to pressurized and heparinized saline bag for constant forward flow. A coaxial system was then advanced over the 035 wire. This included a 110 cm Zoom 088 catheter with  coaxial 125cm Berenstein diagnostic catheter. This was advanced to  the proximal descending thoracic aorta. Wire was then removed. Double flush of the catheter was performed. Catheter was then used to select the innominate artery and the common carotid artery. The catheter combination was then navigated into the cervical ICA segment. The Berenstein catheter and the Glidewire were removed. Angiogram was performed. Road map function was used once the occluded vessel was identified. Copious back flush was performed and the 088 catheter was attached to heparinized and pressurized saline bag for forward flow. A second coaxial system was then advanced through the balloon catheter, which included the selected intermediate catheter, microcatheter, and microwire. In this scenario, the set up included a 137 cm 071 Zoom catheter, a Trevo Provue18 microcatheter, and 014 synchro soft wire. This system was advanced through the guide catheter under the road-map function, with adequate back-flush at the rotating hemostatic valve at that back end of the balloon guide. Microcatheter and the intermediate catheter system were advanced through the terminal ICA and MCA to the level of the occlusion. The micro wire in the microcatheter were then removed. These 071 Zoom catheter was then advanced to the face of the occlusion for the first pass attempt, adapt technique. Catheter was withdrawn while at the same time the 088 distal access catheter was advanced through the carotid siphon to the terminus. Angiogram confirmed persistent occlusion. The 071 catheter was then readvanced through the distal access catheter with a coaxial microwire and microcatheter. Microcatheter and the microwire were then gently navigated through the occlusion into angular branch of the MCA. Microcatheter was advanced through the occlusion. Wire was removed. Blood was then aspirated through the hub of the microcatheter, and a gentle contrast injection was performed  confirming intraluminal position. A rotating hemostatic valve was then attached to the back end of the microcatheter, and a pressurized and heparinized saline bag was attached to the catheter. 4 x 40 solitaire device was then selected. Back flush was achieved at the rotating hemostatic valve, and then the device was gently advanced through the microcatheter to the distal end. The retriever was then unsheathed by withdrawing the microcatheter under fluoroscopy. Once the retriever was completely unsheathed, the microcatheter was carefully stripped from the delivery device. Control angiogram was performed from the intermediate catheter. A 3 minute time interval was observed. Solitaire stent was then withdrawn to the 071 catheter under fluoroscopy. Once the retriever was "corked" within the tip of the intermediate catheter, both were removed from the system. Free aspiration was confirmed at the hub of the 088 distal access catheter, with free blood return confirmed. Control angiogram was performed. Partial occlusion remained with TICI 2b flow at this point. We then advanced the zoom catheter with the microwire and microcatheter combination for a third pass attempt, using the adapt technique. 071 catheter was gently advanced to the face of the clot, and aspiration was performed. Once the catheter was withdrawn into the base 088 distal guide catheter, aspiration was performed at the hub of the 088 catheter. Angiogram was performed. Partial occlusion remained with TICI 2b flow at this point. We then proceeded with a fourth pass. The 071 catheter was then readvanced through the distal access catheter with a coaxial microwire and microcatheter. Microcatheter and the microwire were then gently navigated to the face of the occlusion. The synchro soft wire would not advance through the occlusion. A headliner J wire 012 was then used through the microcatheter, successful in passing through the occlusion into the angular branch.  Microcatheter was advanced through the occlusion. Wire was  removed. Blood was then aspirated through the hub of the microcatheter, and a gentle contrast injection was performed confirming intraluminal position. A rotating hemostatic valve was then attached to the back end of the microcatheter, and a pressurized and heparinized saline bag was attached to the catheter. 5 mm by 37 mm EMBO trapped was then selected. Back flush was achieved at the rotating hemostatic valve, and then the device was gently advanced through the microcatheter to the distal end. The retriever was then unsheathed by withdrawing the microcatheter under fluoroscopy. Once the retriever was completely unsheathed, the microcatheter was carefully stripped from the delivery device. Control angiogram was performed from the intermediate catheter. This control angiogram confirmed flow through the EMBO trap, with a lesion at the site of the occlusion, representing native intracranial atherosclerosis. While the stent tree vert remained open for proximally 5 minutes, we had the anesthesia team pass and OG tube. We administered 650 mg of aspirin. Estimation of the diameter length of the lesion were performed. We selected a drug-eluting balloon mounted stent, 2.25 mm x 15 mm. After approximately 5 minutes, the stent tree vert was removed. Angiogram was performed confirming TICI TB flow, relatively unchanged. We then navigated the microcatheter and the microwire/synchro wire back through the 088 distal access catheter. This combination was successful in crossing the stenosis/lesion in the distal M1. The catheter micro wire were placed into the angular branch and the wire was removed. Gentle injection confirmed location. A transcend wire was then placed through the microcatheter and the microcatheter was removed. A baseline angiogram was performed. We then deployed a balloon mounted drug-eluting stent. We selected a Onyx 2.73mm x 15mm stent. This was deployed  with 7 atmosphere inflation. Balloon was deflated and withdrawn. Repeat angiogram was performed. TICI 3 was achieved. Wires and catheters were removed. The distal access catheter was withdrawn to the cervical ICA and repeat angiogram was performed of the cervical segment. The 088 catheter was then withdrawn. The skin at the puncture site was then cleaned with Chlorhexidine. The 8 French sheath was removed and an 62F angioseal was deployed. Flat panel CT was performed. Patient tolerated the procedure well and remained hemodynamically stable throughout. No complications were encountered and no significant blood loss encountered. IMPRESSION: Status post ultrasound guided access right common femoral artery for cervical and cerebral angiogram, mechanical thrombectomy of right M1 occlusion, and rescue stenting of native intracranial atherosclerotic permissive lesion with 2.25 mm x 15 mm drug-eluting stent, restoring TICI 3 flow. Angio-Seal for hemostasis. Signed, Yvone Neu. Reyne Dumas, RPVI [facility] Radiology PLAN: The patient will remain intubated. ICU status Target systolic blood pressure of 120-140 Right hip straight time 6 hours Frequent neurovascular checks Repeat neurologic imaging with CT and/MRI at the discretion of neurology team Dual anti-platelet therapy. Electronically Signed   By: Gilmer Mor D.O.   On: 10/30/2019 20:58   IR US Guide Vasc Access Right  Result Date: 10/30/2019 INDICATION: 74 year old male with acute stroke, right MCA M1 occlusion EXAM: ULTRASOUND GUIDED ACCESS RIGHT COMMON FEMORAL ARTERY CERVICAL AND CEREBRAL ANGIOGRAM MECHANICAL THROMBECTOMY RIGHT MCA/M1 RESCUE STENT OF RIGHT MCA INTRACRANIAL ATHEROSCLEROSIS ANGIO-SEAL FOR CLOSURE COMPARISON:  CT imaging same day MEDICATIONS: 3 mg intra arterial integral and, 650 mg aspirin OG, 300 mg Plavix OG ANESTHESIA/SEDATION: The anesthesia team was present to provide general endotracheal tube  anesthesia and for patient monitoring during the procedure. Intubation was performed in negative pressure [place] in neuro IR holding. Interventional neuro radiology nursing staff was  also present. CONTRAST:  170 cc Omnipaque 300 FLUOROSCOPY TIME:  Fluoroscopy Time: 40 minutes 42 seconds (3,316 mGy). COMPLICATIONS: None TECHNIQUE: Informed written consent was obtained from the patient's family after a thorough discussion of the procedural risks, benefits and alternatives. Specific risks discussed include: Bleeding, infection, contrast reaction, kidney injury/failure, need for further procedure/surgery, arterial injury or dissection, embolization to new territory, intracranial hemorrhage (10-15% risk), neurologic deterioration, cardiopulmonary collapse, death. All questions were addressed. Maximal Sterile Barrier Technique was utilized including during the procedure including caps, mask, sterile gowns, sterile gloves, sterile drape, hand hygiene and skin antiseptic. A timeout was performed prior to the initiation of the procedure. The anesthesia team was present to provide general endotracheal tube anesthesia and for patient monitoring during the procedure. Interventional neuro radiology nursing staff was also present. FINDINGS: Initial Findings: Right internal carotid artery: Tortuosity of the cervical segment. Vertical and petrous segment patent with normal course caliber contour. Cavernous segment patent. Clinoid segment patent. Antegrade flow of the ophthalmic artery. Ophthalmic segment patent. Terminus patent. Right MCA: Proximal M1 segment is patent. There is a tapered cuff-off of the mid segment of the M1, in the region the mid orbit. Proximal temporal branch remains patent. TICI 0 flow through the M1 segment. There late filling with collaterals via the right anterior cerebral artery and the right posterior cerebral artery via leptomeningeal collateral vessels. Patent anterior communicating artery with the  temporal region supplied by the PCA. Right ACA: A 1 segment patent. A 2 segment perfuses the right territory. Patent anterior communicating artery with significant cross-filling of the left anterior cerebral artery. Completion Findings: During the mechanical thrombectomy, the initial 2 passes resulted in TICI 2B flow, with partial filling of greater than 50% of the right-sided hemispheric MCA branches. The cortical branches after the first 2 passes are filling both from leptomeningeal collaterals as well as through the attenuated M1 segment. The final pass with the EMBO trapped device uncovered atherosclerotic plaque at the distal M1 segment. This atherosclerotic plaque was then treated with drug-eluting balloon mounted stent. Right MCA: TICI 3 flow was restored through the right hemisphere. The stent was successful in restoring flow through the disease segment of the M1 Right common carotid artery:  Normal course caliber and contour. Right external carotid artery: Patent with antegrade flow. Flat panel CT demonstrates minimal staining within the supra ganglionic right MCA territory with no subarachnoid hemorrhage or large parenchymal hemorrhage. PROCEDURE: Patient was brought to the [facility] suite, and identity was confirmed. Patient was prepped and draped in the usual sterile fashion. Ultrasound survey of the right inguinal region was performed with images stored and sent to PACs. 11 blade scalpel was used to make a small incision. Blunt dissection was performed with US guidance. A micropuncture needle was used access the right common femoral artery under ultrasound. With excellent arterial blood flow returned, an .018 micro wire was passed through the needle, observed to enter the abdominal aorta under fluoroscopy. The needle was removed, and a micropuncture sheath was placed over the wire. The inner dilator and wire were removed, and an 035 wire was advanced under fluoroscopy into the abdominal aorta. The sheath was  removed and a 25cm 37F straight vascular sheath was placed. The dilator was removed and the sheath was flushed. Limited angiogram was performed. Sheath was attached to pressurized and heparinized saline bag for constant forward flow. A coaxial system was then advanced over the 035 wire. This included a 110 cm Zoom 088 catheter with coaxial 125cm [place]  diagnostic catheter. This was advanced to the proximal descending thoracic aorta. Wire was then removed. Double flush of the catheter was performed. Catheter was then used to select the innominate artery and the common carotid artery. The catheter combination was then navigated into the cervical ICA segment. The Berenstein catheter and the Glidewire were removed. Angiogram was performed. Road map function was used once the occluded vessel was identified. Copious back flush was performed and the 088 catheter was attached to heparinized and pressurized saline bag for forward flow. A second coaxial system was then advanced through the balloon catheter, which included the selected intermediate catheter, microcatheter, and microwire. In this scenario, the set up included a 137 cm 071 Zoom catheter, a Trevo Provue18 microcatheter, and 014 synchro soft wire. This system was advanced through the guide catheter under the road-map function, with adequate back-flush at the rotating hemostatic valve at that back end of the balloon guide. Microcatheter and the intermediate catheter system were advanced through the terminal ICA and MCA to the level of the occlusion. The micro wire in the microcatheter were then removed. These 071 Zoom catheter was then advanced to the face of the occlusion for the first pass attempt, adapt technique. Catheter was withdrawn while at the same time the 088 distal access catheter was advanced through the carotid siphon to the terminus. Angiogram confirmed persistent occlusion. The 071 catheter was then readvanced through the distal access catheter  with a coaxial microwire and microcatheter. Microcatheter and the microwire were then gently navigated through the occlusion into angular branch of the MCA. Microcatheter was advanced through the occlusion. Wire was removed. Blood was then aspirated through the hub of the microcatheter, and a gentle contrast injection was performed confirming intraluminal position. A rotating hemostatic valve was then attached to the back end of the microcatheter, and a pressurized and heparinized saline bag was attached to the catheter. 4 x 40 solitaire device was then selected. Back flush was achieved at the rotating hemostatic valve, and then the device was gently advanced through the microcatheter to the distal end. The retriever was then unsheathed by withdrawing the microcatheter under fluoroscopy. Once the retriever was completely unsheathed, the microcatheter was carefully stripped from the delivery device. Control angiogram was performed from the intermediate catheter. A 3 minute time interval was observed. Solitaire stent was then withdrawn to the 071 catheter under fluoroscopy. Once the retriever was "corked" within the tip of the intermediate catheter, both were removed from the system. Free aspiration was confirmed at the hub of the 088 distal access catheter, with free blood return confirmed. Control angiogram was performed. Partial occlusion remained with TICI 2b flow at this point. We then advanced the zoom catheter with the microwire and microcatheter combination for a third pass attempt, using the adapt technique. 071 catheter was gently advanced to the face of the clot, and aspiration was performed. Once the catheter was withdrawn into the base 088 distal guide catheter, aspiration was performed at the hub of the 088 catheter. Angiogram was performed. Partial occlusion remained with TICI 2b flow at this point. We then proceeded with a fourth pass. The 071 catheter was then readvanced through the distal access  catheter with a coaxial microwire and microcatheter. Microcatheter and the microwire were then gently navigated to the face of the occlusion. The synchro soft wire would not advance through the occlusion. A headliner J wire 012 was then used through the microcatheter, successful in passing through the occlusion into the angular branch. Microcatheter was  advanced through the occlusion. Wire was removed. Blood was then aspirated through the hub of the microcatheter, and a gentle contrast injection was performed confirming intraluminal position. A rotating hemostatic valve was then attached to the back end of the microcatheter, and a pressurized and heparinized saline bag was attached to the catheter. 5 mm by 37 mm EMBO trapped was then selected. Back flush was achieved at the rotating hemostatic valve, and then the device was gently advanced through the microcatheter to the distal end. The retriever was then unsheathed by withdrawing the microcatheter under fluoroscopy. Once the retriever was completely unsheathed, the microcatheter was carefully stripped from the delivery device. Control angiogram was performed from the intermediate catheter. This control angiogram confirmed flow through the EMBO trap, with a lesion at the site of the occlusion, representing native intracranial atherosclerosis. While the stent tree vert remained open for proximally 5 minutes, we had the anesthesia team pass and OG tube. We administered 650 mg of aspirin. Estimation of the diameter length of the lesion were performed. We selected a drug-eluting balloon mounted stent, 2.25 mm x 15 mm. After approximately 5 minutes, the stent tree vert was removed. Angiogram was performed confirming TICI TB flow, relatively unchanged. We then navigated the microcatheter and the microwire/synchro wire back through the 088 distal access catheter. This combination was successful in crossing the stenosis/lesion in the distal M1. The catheter micro wire were  placed into the angular branch and the wire was removed. Gentle injection confirmed location. A transcend wire was then placed through the microcatheter and the microcatheter was removed. A baseline angiogram was performed. We then deployed a balloon mounted drug-eluting stent. We selected a Onyx 2.74mm x 67mm stent. This was deployed with 7 atmosphere inflation. Balloon was deflated and withdrawn. Repeat angiogram was performed. TICI 3 was achieved. Wires and catheters were removed. The distal access catheter was withdrawn to the cervical ICA and repeat angiogram was performed of the cervical segment. The 088 catheter was then withdrawn. The skin at the puncture site was then cleaned with Chlorhexidine. The 8 French sheath was removed and an 74F angioseal was deployed. Flat panel CT was performed. Patient tolerated the procedure well and remained hemodynamically stable throughout. No complications were encountered and no significant blood loss encountered. IMPRESSION: Status post ultrasound guided access right common femoral artery for cervical and cerebral angiogram, mechanical thrombectomy of right M1 occlusion, and rescue stenting of native intracranial atherosclerotic permissive lesion with 2.25 mm x 15 mm drug-eluting stent, restoring TICI 3 flow. Angio-Seal for hemostasis. Signed, Yvone Neu. Reyne Dumas, RPVI Vascular and Interventional Radiology Specialists [facility] Radiology PLAN: The patient will remain intubated. ICU status Target systolic blood pressure of 120-140 Right hip straight time 6 hours Frequent neurovascular checks Repeat neurologic imaging with CT and/MRI at the discretion of neurology team Dual anti-platelet therapy. Electronically Signed   By: Gilmer Mor D.O.   On: 10/30/2019 20:58   DG CHEST PORT 1 VIEW  Result Date: 10/31/2019 CLINICAL DATA:  CVA. EXAM: PORTABLE CHEST 1 VIEW COMPARISON:  None. FINDINGS: The endotracheal tube is 5.5 cm above the carina. The NG tube is coursing down the  esophagus and into the stomach. The cardiac silhouette, mediastinal and hilar contours are within normal limits given the AP projection and portable technique. No acute pulmonary findings. No pleural effusions or pneumothorax. The bony thorax is intact. IMPRESSION: No acute cardiopulmonary findings. Electronically Signed   By: Rudie Meyer M.D.   On: 10/31/2019 06:35   IR  PERCUTANEOUS ART THROMBECTOMY/INFUSION INTRACRANIAL INC DIAG ANGIO  Result Date: 10/30/2019 INDICATION: 74 year old male with acute stroke, right MCA M1 occlusion EXAM: ULTRASOUND GUIDED ACCESS RIGHT COMMON FEMORAL ARTERY CERVICAL AND CEREBRAL ANGIOGRAM MECHANICAL THROMBECTOMY RIGHT MCA/M1 RESCUE STENT OF RIGHT MCA INTRACRANIAL ATHEROSCLEROSIS ANGIO-SEAL FOR CLOSURE COMPARISON:  CT imaging same day MEDICATIONS: 3 mg intra arterial integral and, 650 mg aspirin OG, 300 mg Plavix OG ANESTHESIA/SEDATION: The anesthesia team was present to provide general endotracheal tube anesthesia and for patient monitoring during the procedure. Intubation was performed in negative pressure [place] in neuro IR holding. Interventional neuro radiology nursing staff was also present. CONTRAST:  170 cc Omnipaque 300 FLUOROSCOPY TIME:  Fluoroscopy Time: 40 minutes 42 seconds (3,316 mGy). COMPLICATIONS: None TECHNIQUE: Informed written consent was obtained from the patient's family after a thorough discussion of the procedural risks, benefits and alternatives. Specific risks discussed include: Bleeding, infection, contrast reaction, kidney injury/failure, need for further procedure/surgery, arterial injury or dissection, embolization to new territory, intracranial hemorrhage (10-15% risk), neurologic deterioration, cardiopulmonary collapse, death. All questions were addressed. Maximal Sterile Barrier Technique was utilized including during the procedure including caps, mask, sterile gowns, sterile gloves, sterile drape, hand hygiene and skin antiseptic. A timeout was  performed prior to the initiation of the procedure. The anesthesia team was present to provide general endotracheal tube anesthesia and for patient monitoring during the procedure. Interventional neuro radiology nursing staff was also present. FINDINGS: Initial Findings: Right internal carotid artery: Tortuosity of the cervical segment. Vertical and petrous segment patent with normal course caliber contour. Cavernous segment patent. Clinoid segment patent. Antegrade flow of the ophthalmic artery. Ophthalmic segment patent. Terminus patent. Right MCA: Proximal M1 segment is patent. There is a tapered cuff-off of the mid segment of the M1, in the region the mid orbit. Proximal temporal branch remains patent. TICI 0 flow through the M1 segment. There late filling with collaterals via the right anterior cerebral artery and the right posterior cerebral artery via leptomeningeal collateral vessels. Patent anterior communicating artery with the temporal region supplied by the PCA. Right ACA: A 1 segment patent. A 2 segment perfuses the right territory. Patent anterior communicating artery with significant cross-filling of the left anterior cerebral artery. Completion Findings: During the mechanical thrombectomy, the initial 2 passes resulted in TICI 2B flow, with partial filling of greater than 50% of the right-sided hemispheric MCA branches. The cortical branches after the first 2 passes are filling both from leptomeningeal collaterals as well as through the attenuated M1 segment. The final pass with the EMBO trapped device uncovered atherosclerotic plaque at the distal M1 segment. This atherosclerotic plaque was then treated with drug-eluting balloon mounted stent. Right MCA: TICI 3 flow was restored through the right hemisphere. The stent was successful in restoring flow through the disease segment of the M1 Right common carotid artery:  Normal course caliber and contour. Right external carotid artery: Patent with  antegrade flow. Flat panel CT demonstrates minimal staining within the supra ganglionic right MCA territory with no subarachnoid hemorrhage or large parenchymal hemorrhage. PROCEDURE: Patient was brought to the [facility], and identity was confirmed. Patient was prepped and draped in the usual sterile fashion. Ultrasound survey of the right inguinal region was performed with images stored and sent to PACs. 11 blade scalpel was used to make a small incision. Blunt dissection was performed with US guidance. A micropuncture needle was used access the right common femoral artery under ultrasound. With excellent arterial blood flow returned, an .018 micro wire was passed  through the needle, observed to enter the abdominal aorta under fluoroscopy. The needle was removed, and a micropuncture sheath was placed over the wire. The inner dilator and wire were removed, and an 035 wire was advanced under fluoroscopy into the abdominal aorta. The sheath was removed and a 25cm 57F straight vascular sheath was placed. The dilator was removed and the sheath was flushed. Limited angiogram was performed. Sheath was attached to pressurized and heparinized saline bag for constant forward flow. A coaxial system was then advanced over the 035 wire. This included a 110 cm Zoom 088 catheter with coaxial 125cm Berenstein diagnostic catheter. This was advanced to the proximal descending thoracic aorta. Wire was then removed. Double flush of the catheter was performed. Catheter was then used to select the innominate artery and the common carotid artery. The catheter combination was then navigated into the cervical ICA segment. The Berenstein catheter and the Glidewire were removed. Angiogram was performed. Road map function was used once the occluded vessel was identified. Copious back flush was performed and the 088 catheter was attached to heparinized and pressurized saline bag for forward flow. A second coaxial system was then advanced  through the balloon catheter, which included the selected intermediate catheter, microcatheter, and microwire. In this scenario, the set up included a 137 cm 071 Zoom catheter, a Trevo Provue18 microcatheter, and 014 synchro soft wire. This system was advanced through the guide catheter under the road-map function, with adequate back-flush at the rotating hemostatic valve at that back end of the balloon guide. Microcatheter and the intermediate catheter system were advanced through the terminal ICA and MCA to the level of the occlusion. The micro wire in the microcatheter were then removed. These 071 Zoom catheter was then advanced to the face of the occlusion for the first pass attempt, adapt technique. Catheter was withdrawn while at the same time the 088 distal access catheter was advanced through the carotid siphon to the terminus. Angiogram confirmed persistent occlusion. The 071 catheter was then readvanced through the distal access catheter with a coaxial microwire and microcatheter. Microcatheter and the microwire were then gently navigated through the occlusion into angular branch of the MCA. Microcatheter was advanced through the occlusion. Wire was removed. Blood was then aspirated through the hub of the microcatheter, and a gentle contrast injection was performed confirming intraluminal position. A rotating hemostatic valve was then attached to the back end of the microcatheter, and a pressurized and heparinized saline bag was attached to the catheter. 4 x 40 solitaire device was then selected. Back flush was achieved at the rotating hemostatic valve, and then the device was gently advanced through the microcatheter to the distal end. The retriever was then unsheathed by withdrawing the microcatheter under fluoroscopy. Once the retriever was completely unsheathed, the microcatheter was carefully stripped from the delivery device. Control angiogram was performed from the intermediate catheter. A 3 minute  time interval was observed. Solitaire stent was then withdrawn to the 071 catheter under fluoroscopy. Once the retriever was "corked" within the tip of the intermediate catheter, both were removed from the system. Free aspiration was confirmed at the hub of the 088 distal access catheter, with free blood return confirmed. Control angiogram was performed. Partial occlusion remained with TICI 2b flow at this point. We then advanced the zoom catheter with the microwire and microcatheter combination for a third pass attempt, using the adapt technique. 071 catheter was gently advanced to the face of the clot, and aspiration was performed. Once the  catheter was withdrawn into the base 088 distal guide catheter, aspiration was performed at the hub of the 088 catheter. Angiogram was performed. Partial occlusion remained with TICI 2b flow at this point. We then proceeded with a fourth pass. The 071 catheter was then readvanced through the distal access catheter with a coaxial microwire and microcatheter. Microcatheter and the microwire were then gently navigated to the face of the occlusion. The synchro soft wire would not advance through the occlusion. A headliner J wire 012 was then used through the microcatheter, successful in passing through the occlusion into the angular branch. Microcatheter was advanced through the occlusion. Wire was removed. Blood was then aspirated through the hub of the microcatheter, and a gentle contrast injection was performed confirming intraluminal position. A rotating hemostatic valve was then attached to the back end of the microcatheter, and a pressurized and heparinized saline bag was attached to the catheter. 5 mm by 37 mm EMBO trapped was then selected. Back flush was achieved at the rotating hemostatic valve, and then the device was gently advanced through the microcatheter to the distal end. The retriever was then unsheathed by withdrawing the microcatheter under fluoroscopy. Once the  retriever was completely unsheathed, the microcatheter was carefully stripped from the delivery device. Control angiogram was performed from the intermediate catheter. This control angiogram confirmed flow through the EMBO trap, with a lesion at the site of the occlusion, representing native intracranial atherosclerosis. While the stent tree vert remained open for proximally 5 minutes, we had the anesthesia team pass and OG tube. We administered 650 mg of aspirin. Estimation of the diameter length of the lesion were performed. We selected a drug-eluting balloon mounted stent, 2.25 mm x 15 mm. After approximately 5 minutes, the stent tree vert was removed. Angiogram was performed confirming TICI TB flow, relatively unchanged. We then navigated the microcatheter and the microwire/synchro wire back through the 088 distal access catheter. This combination was successful in crossing the stenosis/lesion in the distal M1. The catheter micro wire were placed into the angular branch and the wire was removed. Gentle injection confirmed location. A transcend wire was then placed through the microcatheter and the microcatheter was removed. A baseline angiogram was performed. We then deployed a balloon mounted drug-eluting stent. We selected a Onyx 2.77mm x 15mm stent. This was deployed with 7 atmosphere inflation. Balloon was deflated and withdrawn. Repeat angiogram was performed. TICI 3 was achieved. Wires and catheters were removed. The distal access catheter was withdrawn to the cervical ICA and repeat angiogram was performed of the cervical segment. The 088 catheter was then withdrawn. The skin at the puncture site was then cleaned with Chlorhexidine. The 8 French sheath was removed and an 90F angioseal was deployed. Flat panel CT was performed. Patient tolerated the procedure well and remained hemodynamically stable throughout. No complications were encountered and no significant blood loss encountered. IMPRESSION: Status  post ultrasound guided access right common femoral artery for cervical and cerebral angiogram, mechanical thrombectomy of right M1 occlusion, and rescue stenting of native intracranial atherosclerotic permissive lesion with 2.25 mm x 15 mm drug-eluting stent, restoring TICI 3 flow. Angio-Seal for hemostasis. Signed, Yvone Neu. Reyne Dumas, RPVI [facility] Radiology PLAN: The patient will remain intubated. ICU status Target systolic blood pressure of 120-140 Right hip straight time 6 hours Frequent neurovascular checks Repeat neurologic imaging with CT and/MRI at the discretion of neurology team Dual anti-platelet therapy. Electronically Signed   By: Gilmer Mor D.O.  On: 10/30/2019 20:58   CT HEAD CODE STROKE WO CONTRAST`  Addendum Date: 10/30/2019   ADDENDUM REPORT: 10/30/2019 15:38 ADDENDUM: Study discussed by telephone with Dr. Chesley Noon on 10/30/2019 at 1532 hours. Electronically Signed   By: Odessa Fleming M.D.   On: 10/30/2019 15:38   Result Date: 10/30/2019 CLINICAL DATA:  Code stroke. 74 year old male last known well 1100 hours. Right gaze deviation and aphasia. EXAM: CT HEAD WITHOUT CONTRAST TECHNIQUE: Contiguous axial images were obtained from the base of the skull through the vertex without intravenous contrast. COMPARISON:  Head CT 03/21/2018. FINDINGS: Brain: Stable cerebral volume. No midline shift, mass effect, or evidence of intracranial mass lesion. No ventriculomegaly. No acute intracranial hemorrhage identified. Advanced chronic small vessel ischemia in the bilateral corona radiata and deep gray nuclei, with Patchy and confluent bilateral hypodensity more so on the left. Some associated ex vacuo enlargement of the left lateral ventricle. But gray-white matter differentiation appears stable since 2019. No cortically based acute infarct identified. No cortical encephalomalacia identified. Vascular: Mild Calcified atherosclerosis at the skull base.  No suspicious intracranial vascular hyperdensity. Skull: Stable, negative. Sinuses/Orbits: Chronic bubbly opacity in the left sphenoid. Other Visualized paranasal sinuses and mastoids are stable and well pneumatized. Other: Rightward gaze deviation. No acute scalp soft tissue finding. ASPECTS [facility] Stroke Program Early CT Score) Total score (0-10 with 10 being normal): 10 (chronic encephalomalacia). IMPRESSION: 1. Advanced chronic small vessel disease appears stable by CT since 2019. 2. No acute cortically based infarct or acute intracranial hemorrhage identified. ASPECTS 10. Electronically Signed: By: Odessa Fleming M.D. On: 10/30/2019 15:25    PHYSICAL EXAM  Temp:  [97.6 F (36.4 C)-99 F (37.2 C)] 98.4 F (36.9 C) (05/19 0401) Pulse Rate:  [51-113] 113 (05/19 1000) Resp:  [12-23] 18 (05/19 1000) BP: (95-186)/(56-97) 140/83 (05/19 1000) SpO2:  [96 %-100 %] 100 % (05/19 1000) Arterial Line BP: (101-148)/(48-72) 143/61 (05/19 1000) FiO2 (%):  [40 %-60 %] 40 % (05/19 0957) Weight:  [98.1 kg] 98.1 kg (05/18 1522)  General - Well nourished, well developed, intubated off sedation.  Ophthalmologic - fundi not visualized due to noncooperation.  Cardiovascular - Regular rate and rhythm.  Neuro - intubated off sedation, eyes open on voice, following commands. With forced eye opening, eyes in mid position, able to move bilaterally, no gaze palsy, inconsistnetly blinking to visual threat bilaterally, able to track bilatereally, PERRL. Corneal reflex present, gag and cough present. Breathing over the vent.  Facial symmetry not able to test due to ET tube.  Tongue protrusion not cooperative. Moving all extremities on command, RUE 4/5 and LUE 3+/5, BLE 3-/5 proximal and distally. DTR 1+ and no babinski. Sensation, coordination not cooperative and gait not tested.   ASSESSMENT/PLAN Mr. Jeffery Reilly is a 74 y.o. male with history of  L MCA stroke with resultant R sided weakness and HTN, but has not seen a  physician in 6-7 yrs and is not on any medications presenting to [facility] with transient recurrent episodes of aphasia and automatisms of R arm with increased tone in all extremities. CTA showed distal R M2 occlusion. Transferred t [facility] for possible IR.    Stroke:  R MCA scattered and single punctate right cerebellar infarcts with right M1 occlusion s/p R M1 thrombectomy and stent, embolic pattern, source unclear  Code Stroke CT head No acute abnormality. Small vessel disease. ASPECTS 10.     CTA head distal R M1 occlusion w/ partial reconstitution of distal territory.  CTA neck Unremarkable   Cerebral angio R M1 occlusion treated w/ DES balloon inflatable stent w/ TICI3 flow  MRI scattered right MCA infarcts with one punctate right cerebellar infarct  MRA right M1 stent patent  2D Echo pending  LE venous doppler pending  Consider loop recorder if above work up unrevealing  LDL 143  HgbA1c pending   UDS neg  SCDs for VTE prophylaxis  No antithrombotic prior to admission, now on aspirin 81 mg daily and clopidogrel 75 mg daily.  Continue DAPT for 3 to 6 months due to stent placement.  Therapy recommendations:  pending   Disposition:  pending   Possible seizure   Episode of automatisms and increased body tone  treated w/ ativan and Keppra load 1gm  EEG no seizure, mild diffuse encephalopathy  Continue Keppra 500 twice daily  Acute Respiratory Failure  Intubated for IR, left intubated d/t inability to protect airway  On propofol, plan to extubate today  CCM onboard  Hypertensive emergency  BP on arrival 186/94  Home meds:  None (non-compliant), does have hx HTN . BP goal 120-140 for 24h following IR procedure  . On Cleviprex, wean off as able . Put on amlodipine and lisinopril . TG 316 -> pending . Long-term BP goal normotensive  Hyperlipidemia  Home meds:  No statin  Now on lipitor 80   LDL 143, goal < 70  Continue statin  at discharge  Possible Diabetes   HgbA1c pending, goal < 7.0  CBGs  SSI  PCP follow up  Dysphagia . Secondary to stroke . NPO . Speech on board . On IVF NS @ 50   Other Stroke Risk Factors  Advanced age  Former Cigarette smoker  Hx stroke/TIA  20 yrs ago per wife w/ resultant R hemiparesis    Other Active Problems  Hx abdominal surgery  Hospital day # 1  This patient is critically ill due to right M1 occlusion, right MCA stroke, status post thrombectomy and stenting, respiratory failure on ventilation, hypertensive emergency on IV BP meds, and seizure and at significant risk of neurological worsening, death form recurrent stroke, hemorrhagic conversion, respiratory failure, hypertensive encephalopathy, status epilepticus, heart failure. This patient's care requires constant monitoring of vital signs, hemodynamics, respiratory and cardiac monitoring, review of multiple databases, neurological assessment, discussion with family, other specialists and medical decision making of high complexity. I spent 40 minutes of neurocritical care time in the care of this patient.  I have discussed with Dr. Denese Killings.  Marvel Plan, MD PhD Stroke Neurology 10/31/2019 7:04 PM     To contact Stroke Continuity provider, please refer to [email or web address]. After hours, contact General Neurology

## 2019-10-31 NOTE — Progress Notes (Signed)
OT Cancellation Note  Patient Details Name: Maritza Goldsborough MRN: [id number] DOB: 07-Jul-1945   Cancelled Treatment:    Reason Eval/Treat Not Completed: Other (comment)(Sedated. Intubated. May be extubated today. Will check back as schedule allows )  [facility] Linah Klapper, OT/L   Acute OT Clinical Specialist Acute Rehabilitation Services Pager [phone] Office [phone]  10/31/2019, 10:47 AM

## 2019-10-31 NOTE — Progress Notes (Signed)
SLP Cancellation Note  Patient Details Name: Jeffery Reilly MRN: [id number] DOB: Apr 04, 1946   Cancelled treatment:       Reason Eval/Treat Not Completed: Medical issues which prohibited therapy. Pt currently intubated. Will f/u as able.    Mahala Menghini., M.A. CCC-SLP Acute Rehabilitation Services Pager [phone] Office [phone]  10/31/2019, 9:39 AM

## 2019-10-31 NOTE — Progress Notes (Signed)
Referring Physician(s): Code Stroke- Milon Dikes  Supervising Physician: Gilmer Mor  Patient Status:  [facility] - In-pt  Chief Complaint: None- intubated with sedation  Subjective:  History of CVA s/p cerebral arteriogram with emergent mechanical thrombectomy of right MCA M1 occlusion along with rescue stenting of right MCA M1 underlying stenosis achieving a TICI 3 revascularization 10/30/2019 by Dr. Loreta Ave. Patient laying in bed intubated with sedation. He opens eyes to voice and follows simple commands. Moving all 4s with weakness of left side.  Right groin incision c/d/i.   Allergies: Patient has no known allergies.  Medications: Prior to Admission medications   Not on File     Vital Signs: BP (!) 160/93   Pulse (!) 113   Temp 99.4 F (37.4 C) (Axillary)   Resp 18   SpO2 100%   Physical Exam Vitals and nursing note reviewed.  Constitutional:      General: He is not in acute distress.    Comments: Intubated with sedation.  Pulmonary:     Effort: Pulmonary effort is normal. No respiratory distress.     Comments: Intubated with sedation. Skin:    General: Skin is warm and dry.     Comments: Right groin incision soft without active bleeding or hematoma.  Neurological:     Comments: Intubated with sedation. He opens eyes to voice and follows simple commands. PERRL bilaterally. Left gaze preference, unable to cross midline. Can spontaneously move all extremities with weakness of left side (able to give thumbs up/lift forearm off bed, able to wiggle bilateral toes). Distal pulses (DPs) 1+ bilaterally.     Imaging: CT Angio Head W or Wo Contrast  Result Date: 10/30/2019 CLINICAL DATA:  Code stroke follow-up EXAM: CT ANGIOGRAPHY HEAD AND NECK TECHNIQUE: Multidetector CT imaging of the head and neck was performed using the standard protocol during bolus administration of intravenous contrast. Multiplanar CT image reconstructions and MIPs were obtained to evaluate  the vascular anatomy. Carotid stenosis measurements (when applicable) are obtained utilizing NASCET criteria, using the distal internal carotid diameter as the denominator. CONTRAST:  75mL OMNIPAQUE IOHEXOL 350 MG/ML SOLN COMPARISON:  Earlier same day FINDINGS: CT HEAD FINDINGS Brain: There is no acute intracranial hemorrhage, mass effect, or edema. No new loss of gray-white differentiation. Chronic infarctions and chronic microvascular ischemic changes as described previously. Vascular: No new finding Skull: No new finding Sinuses: No new finding Orbits: No new finding Review of the MIP images confirms the above findings CTA NECK FINDINGS Aortic arch: Great vessel origins are patent. There is direct origin of the left vertebral artery from the arch. Right carotid system: Patent. No measurable stenosis at the ICA origin. Retropharyngeal course of the ICA. Left carotid system: Patent. No measurable stenosis at the ICA origin. Retropharyngeal course of the ICA. Vertebral arteries: Patent and codominant. Skeleton: Multilevel degenerative changes of the cervical spine. Other neck: No mass or adenopathy. Upper chest: No apical lung mass. Review of the MIP images confirms the above findings CTA HEAD FINDINGS Anterior circulation: Intracranial internal carotid arteries are patent. There is occlusion of the distal right M1 MCA. Diminished flow within the more distal right MCA territory. Anterior and left middle cerebral arteries are patent. Posterior circulation: Intracranial vertebral arteries are patent. Basilar artery is patent with focal short segment moderate stenosis including the level of left AICA origin. Posterior cerebral arteries are patent. There is a patent right posterior communicating artery. Venous sinuses: Not well evaluated on this study. Review of the MIP images  confirms the above findings IMPRESSION: Distal right M1 MCA occlusion. Partial reconstitution of more distal right MCA territory. No acute  intracranial hemorrhage.  ASPECT score remains 10. No hemodynamically significant stenosis in the neck. These results were called by telephone at the time of interpretation on 10/30/2019 at 4:28 pm to provider [facility] , who verbally acknowledged these results. Electronically Signed   By: Macy Mis M.D.   On: 10/30/2019 16:32   CT Angio Neck W and/or Wo Contrast  Result Date: 10/30/2019 CLINICAL DATA:  Code stroke follow-up EXAM: CT ANGIOGRAPHY HEAD AND NECK TECHNIQUE: Multidetector CT imaging of the head and neck was performed using the standard protocol during bolus administration of intravenous contrast. Multiplanar CT image reconstructions and MIPs were obtained to evaluate the vascular anatomy. Carotid stenosis measurements (when applicable) are obtained utilizing NASCET criteria, using the distal internal carotid diameter as the denominator. CONTRAST:  9mL OMNIPAQUE IOHEXOL 350 MG/ML SOLN COMPARISON:  Earlier same day FINDINGS: CT HEAD FINDINGS Brain: There is no acute intracranial hemorrhage, mass effect, or edema. No new loss of gray-white differentiation. Chronic infarctions and chronic microvascular ischemic changes as described previously. Vascular: No new finding Skull: No new finding Sinuses: No new finding Orbits: No new finding Review of the MIP images confirms the above findings CTA NECK FINDINGS Aortic arch: Great vessel origins are patent. There is direct origin of the left vertebral artery from the arch. Right carotid system: Patent. No measurable stenosis at the ICA origin. Retropharyngeal course of the ICA. Left carotid system: Patent. No measurable stenosis at the ICA origin. Retropharyngeal course of the ICA. Vertebral arteries: Patent and codominant. Skeleton: Multilevel degenerative changes of the cervical spine. Other neck: No mass or adenopathy. Upper chest: No apical lung mass. Review of the MIP images confirms the above findings CTA HEAD FINDINGS Anterior circulation:  Intracranial internal carotid arteries are patent. There is occlusion of the distal right M1 MCA. Diminished flow within the more distal right MCA territory. Anterior and left middle cerebral arteries are patent. Posterior circulation: Intracranial vertebral arteries are patent. Basilar artery is patent with focal short segment moderate stenosis including the level of left AICA origin. Posterior cerebral arteries are patent. There is a patent right posterior communicating artery. Venous sinuses: Not well evaluated on this study. Review of the MIP images confirms the above findings IMPRESSION: Distal right M1 MCA occlusion. Partial reconstitution of more distal right MCA territory. No acute intracranial hemorrhage.  ASPECT score remains 10. No hemodynamically significant stenosis in the neck. These results were called by telephone at the time of interpretation on 10/30/2019 at 4:28 pm to provider [facility] , who verbally acknowledged these results. Electronically Signed   By: Macy Mis M.D.   On: 10/30/2019 16:32   IR CT Head Ltd  Result Date: 10/30/2019 INDICATION: 74 year old male with acute stroke, right MCA M1 occlusion EXAM: ULTRASOUND GUIDED ACCESS RIGHT COMMON FEMORAL ARTERY CERVICAL AND CEREBRAL ANGIOGRAM MECHANICAL THROMBECTOMY RIGHT MCA/M1 RESCUE STENT OF RIGHT MCA INTRACRANIAL ATHEROSCLEROSIS ANGIO-SEAL FOR CLOSURE COMPARISON:  CT imaging same day MEDICATIONS: 3 mg intra arterial integral and, 650 mg aspirin OG, 300 mg Plavix OG ANESTHESIA/SEDATION: The anesthesia team was present to provide general endotracheal tube anesthesia and for patient monitoring during the procedure. Intubation was performed in negative pressure [place] in neuro IR holding. Interventional neuro radiology nursing staff was also present. CONTRAST:  170 cc Omnipaque 300 FLUOROSCOPY TIME:  Fluoroscopy Time: 40 minutes 42 seconds (3,316 mGy). COMPLICATIONS: None TECHNIQUE: Informed written consent  was obtained from the  patient's family after a thorough discussion of the procedural risks, benefits and alternatives. Specific risks discussed include: Bleeding, infection, contrast reaction, kidney injury/failure, need for further procedure/surgery, arterial injury or dissection, embolization to new territory, intracranial hemorrhage (10-15% risk), neurologic deterioration, cardiopulmonary collapse, death. All questions were addressed. Maximal Sterile Barrier Technique was utilized including during the procedure including caps, mask, sterile gowns, sterile gloves, sterile drape, hand hygiene and skin antiseptic. A timeout was performed prior to the initiation of the procedure. The anesthesia team was present to provide general endotracheal tube anesthesia and for patient monitoring during the procedure. Interventional neuro radiology nursing staff was also present. FINDINGS: Initial Findings: Right internal carotid artery: Tortuosity of the cervical segment. Vertical and petrous segment patent with normal course caliber contour. Cavernous segment patent. Clinoid segment patent. Antegrade flow of the ophthalmic artery. Ophthalmic segment patent. Terminus patent. Right MCA: Proximal M1 segment is patent. There is a tapered cuff-off of the mid segment of the M1, in the region the mid orbit. Proximal temporal branch remains patent. TICI 0 flow through the M1 segment. There late filling with collaterals via the right anterior cerebral artery and the right posterior cerebral artery via leptomeningeal collateral vessels. Patent anterior communicating artery with the temporal region supplied by the PCA. Right ACA: A 1 segment patent. A 2 segment perfuses the right territory. Patent anterior communicating artery with significant cross-filling of the left anterior cerebral artery. Completion Findings: During the mechanical thrombectomy, the initial 2 passes resulted in TICI 2B flow, with partial filling of greater than 50% of the right-sided  hemispheric MCA branches. The cortical branches after the first 2 passes are filling both from leptomeningeal collaterals as well as through the attenuated M1 segment. The final pass with the EMBO trapped device uncovered atherosclerotic plaque at the distal M1 segment. This atherosclerotic plaque was then treated with drug-eluting balloon mounted stent. Right MCA: TICI 3 flow was restored through the right hemisphere. The stent was successful in restoring flow through the disease segment of the M1 Right common carotid artery:  Normal course caliber and contour. Right external carotid artery: Patent with antegrade flow. Flat panel CT demonstrates minimal staining within the supra ganglionic right MCA territory with no subarachnoid hemorrhage or large parenchymal hemorrhage. PROCEDURE: Patient was brought to the [facility] suite, and identity was confirmed. Patient was prepped and draped in the usual sterile fashion. Ultrasound survey of the right inguinal region was performed with images stored and sent to PACs. 11 blade scalpel was used to make a small incision. Blunt dissection was performed with US guidance. A micropuncture needle was used access the right common femoral artery under ultrasound. With excellent arterial blood flow returned, an .018 micro wire was passed through the needle, observed to enter the abdominal aorta under fluoroscopy. The needle was removed, and a micropuncture sheath was placed over the wire. The inner dilator and wire were removed, and an 035 wire was advanced under fluoroscopy into the abdominal aorta. The sheath was removed and a 25cm 27F straight vascular sheath was placed. The dilator was removed and the sheath was flushed. Limited angiogram was performed. Sheath was attached to pressurized and heparinized saline bag for constant forward flow. A coaxial system was then advanced over the 035 wire. This included a 110 cm Zoom 088 catheter with coaxial 125cm Berenstein diagnostic catheter.  This was advanced to the proximal descending thoracic aorta. Wire was then removed. Double flush of the catheter was performed. Catheter was  then used to select the innominate artery and the common carotid artery. The catheter combination was then navigated into the cervical ICA segment. The Berenstein catheter and the Glidewire were removed. Angiogram was performed. Road map function was used once the occluded vessel was identified. Copious back flush was performed and the 088 catheter was attached to heparinized and pressurized saline bag for forward flow. A second coaxial system was then advanced through the balloon catheter, which included the selected intermediate catheter, microcatheter, and microwire. In this scenario, the set up included a 137 cm 071 Zoom catheter, a Trevo Provue18 microcatheter, and 014 synchro soft wire. This system was advanced through the guide catheter under the road-map function, with adequate back-flush at the rotating hemostatic valve at that back end of the balloon guide. Microcatheter and the intermediate catheter system were advanced through the terminal ICA and MCA to the level of the occlusion. The micro wire in the microcatheter were then removed. These 071 Zoom catheter was then advanced to the face of the occlusion for the first pass attempt, adapt technique. Catheter was withdrawn while at the same time the 088 distal access catheter was advanced through the carotid siphon to the terminus. Angiogram confirmed persistent occlusion. The 071 catheter was then readvanced through the distal access catheter with a coaxial microwire and microcatheter. Microcatheter and the microwire were then gently navigated through the occlusion into angular branch of the MCA. Microcatheter was advanced through the occlusion. Wire was removed. Blood was then aspirated through the hub of the microcatheter, and a gentle contrast injection was performed confirming intraluminal position. A rotating  hemostatic valve was then attached to the back end of the microcatheter, and a pressurized and heparinized saline bag was attached to the catheter. 4 x 40 solitaire device was then selected. Back flush was achieved at the rotating hemostatic valve, and then the device was gently advanced through the microcatheter to the distal end. The retriever was then unsheathed by withdrawing the microcatheter under fluoroscopy. Once the retriever was completely unsheathed, the microcatheter was carefully stripped from the delivery device. Control angiogram was performed from the intermediate catheter. A 3 minute time interval was observed. Solitaire stent was then withdrawn to the 071 catheter under fluoroscopy. Once the retriever was "corked" within the tip of the intermediate catheter, both were removed from the system. Free aspiration was confirmed at the hub of the 088 distal access catheter, with free blood return confirmed. Control angiogram was performed. Partial occlusion remained with TICI 2b flow at this point. We then advanced the zoom catheter with the microwire and microcatheter combination for a third pass attempt, using the adapt technique. 071 catheter was gently advanced to the face of the clot, and aspiration was performed. Once the catheter was withdrawn into the base 088 distal guide catheter, aspiration was performed at the hub of the 088 catheter. Angiogram was performed. Partial occlusion remained with TICI 2b flow at this point. We then proceeded with a fourth pass. The 071 catheter was then readvanced through the distal access catheter with a coaxial microwire and microcatheter. Microcatheter and the microwire were then gently navigated to the face of the occlusion. The synchro soft wire would not advance through the occlusion. A headliner J wire 012 was then used through the microcatheter, successful in passing through the occlusion into the angular branch. Microcatheter was advanced through the  occlusion. Wire was removed. Blood was then aspirated through the hub of the microcatheter, and a gentle contrast injection was performed  confirming intraluminal position. A rotating hemostatic valve was then attached to the back end of the microcatheter, and a pressurized and heparinized saline bag was attached to the catheter. 5 mm by 37 mm EMBO trapped was then selected. Back flush was achieved at the rotating hemostatic valve, and then the device was gently advanced through the microcatheter to the distal end. The retriever was then unsheathed by withdrawing the microcatheter under fluoroscopy. Once the retriever was completely unsheathed, the microcatheter was carefully stripped from the delivery device. Control angiogram was performed from the intermediate catheter. This control angiogram confirmed flow through the EMBO trap, with a lesion at the site of the occlusion, representing native intracranial atherosclerosis. While the stent tree vert remained open for proximally 5 minutes, we had the anesthesia team pass and OG tube. We administered 650 mg of aspirin. Estimation of the diameter length of the lesion were performed. We selected a drug-eluting balloon mounted stent, 2.25 mm x 15 mm. After approximately 5 minutes, the stent tree vert was removed. Angiogram was performed confirming TICI TB flow, relatively unchanged. We then navigated the microcatheter and the microwire/synchro wire back through the 088 distal access catheter. This combination was successful in crossing the stenosis/lesion in the distal M1. The catheter micro wire were placed into the angular branch and the wire was removed. Gentle injection confirmed location. A transcend wire was then placed through the microcatheter and the microcatheter was removed. A baseline angiogram was performed. We then deployed a balloon mounted drug-eluting stent. We selected a Onyx 2.7525mm x 15mm stent. This was deployed with 7 atmosphere inflation. Balloon  was deflated and withdrawn. Repeat angiogram was performed. TICI 3 was achieved. Wires and catheters were removed. The distal access catheter was withdrawn to the cervical ICA and repeat angiogram was performed of the cervical segment. The 088 catheter was then withdrawn. The skin at the puncture site was then cleaned with Chlorhexidine. The 8 French sheath was removed and an 16F angioseal was deployed. Flat panel CT was performed. Patient tolerated the procedure well and remained hemodynamically stable throughout. No complications were encountered and no significant blood loss encountered. IMPRESSION: Status post ultrasound guided access right common femoral artery for cervical and cerebral angiogram, mechanical thrombectomy of right M1 occlusion, and rescue stenting of native intracranial atherosclerotic permissive lesion with 2.25 mm x 15 mm drug-eluting stent, restoring TICI 3 flow. Angio-Seal for hemostasis. Signed, Yvone NeuJaime S. Reyne DumasWagner, DO, RPVI [facility] PLAN: The patient will remain intubated. ICU status Target systolic blood pressure of 120-140 Right hip straight time 6 hours Frequent neurovascular checks Repeat neurologic imaging with CT and/MRI at the discretion of neurology team Dual anti-platelet therapy. Electronically Signed   By: Gilmer MorJaime  Wagner D.O.   On: 10/30/2019 20:58   IR US Guide Vasc Access Right  Result Date: 10/30/2019 INDICATION: 74 year old male with acute stroke, right MCA M1 occlusion EXAM: ULTRASOUND GUIDED ACCESS RIGHT COMMON FEMORAL ARTERY CERVICAL AND CEREBRAL ANGIOGRAM MECHANICAL THROMBECTOMY RIGHT MCA/M1 RESCUE STENT OF RIGHT MCA INTRACRANIAL ATHEROSCLEROSIS ANGIO-SEAL FOR CLOSURE COMPARISON:  CT imaging same day MEDICATIONS: 3 mg intra arterial integral and, 650 mg aspirin OG, 300 mg Plavix OG ANESTHESIA/SEDATION: The anesthesia team was present to provide general endotracheal tube anesthesia and for patient monitoring  during the procedure. Intubation was performed in negative pressure [place] in neuro IR holding. Interventional neuro radiology nursing staff was also present. CONTRAST:  170 cc Omnipaque 300 FLUOROSCOPY TIME:  Fluoroscopy Time: 40 minutes 42 seconds (3,316  mGy). COMPLICATIONS: None TECHNIQUE: Informed written consent was obtained from the patient's family after a thorough discussion of the procedural risks, benefits and alternatives. Specific risks discussed include: Bleeding, infection, contrast reaction, kidney injury/failure, need for further procedure/surgery, arterial injury or dissection, embolization to new territory, intracranial hemorrhage (10-15% risk), neurologic deterioration, cardiopulmonary collapse, death. All questions were addressed. Maximal Sterile Barrier Technique was utilized including during the procedure including caps, mask, sterile gowns, sterile gloves, sterile drape, hand hygiene and skin antiseptic. A timeout was performed prior to the initiation of the procedure. The anesthesia team was present to provide general endotracheal tube anesthesia and for patient monitoring during the procedure. Interventional neuro radiology nursing staff was also present. FINDINGS: Initial Findings: Right internal carotid artery: Tortuosity of the cervical segment. Vertical and petrous segment patent with normal course caliber contour. Cavernous segment patent. Clinoid segment patent. Antegrade flow of the ophthalmic artery. Ophthalmic segment patent. Terminus patent. Right MCA: Proximal M1 segment is patent. There is a tapered cuff-off of the mid segment of the M1, in the region the mid orbit. Proximal temporal branch remains patent. TICI 0 flow through the M1 segment. There late filling with collaterals via the right anterior cerebral artery and the right posterior cerebral artery via leptomeningeal collateral vessels. Patent anterior communicating artery with the temporal region supplied by the PCA. Right  ACA: A 1 segment patent. A 2 segment perfuses the right territory. Patent anterior communicating artery with significant cross-filling of the left anterior cerebral artery. Completion Findings: During the mechanical thrombectomy, the initial 2 passes resulted in TICI 2B flow, with partial filling of greater than 50% of the right-sided hemispheric MCA branches. The cortical branches after the first 2 passes are filling both from leptomeningeal collaterals as well as through the attenuated M1 segment. The final pass with the EMBO trapped device uncovered atherosclerotic plaque at the distal M1 segment. This atherosclerotic plaque was then treated with drug-eluting balloon mounted stent. Right MCA: TICI 3 flow was restored through the right hemisphere. The stent was successful in restoring flow through the disease segment of the M1 Right common carotid artery:  Normal course caliber and contour. Right external carotid artery: Patent with antegrade flow. Flat panel CT demonstrates minimal staining within the supra ganglionic right MCA territory with no subarachnoid hemorrhage or large parenchymal hemorrhage. PROCEDURE: Patient was brought to the [facility] suite, and identity was confirmed. Patient was prepped and draped in the usual sterile fashion. Ultrasound survey of the right inguinal region was performed with images stored and sent to PACs. 11 blade scalpel was used to make a small incision. Blunt dissection was performed with US guidance. A micropuncture needle was used access the right common femoral artery under ultrasound. With excellent arterial blood flow returned, an .018 micro wire was passed through the needle, observed to enter the abdominal aorta under fluoroscopy. The needle was removed, and a micropuncture sheath was placed over the wire. The inner dilator and wire were removed, and an 035 wire was advanced under fluoroscopy into the abdominal aorta. The sheath was removed and a 25cm 20F straight vascular  sheath was placed. The dilator was removed and the sheath was flushed. Limited angiogram was performed. Sheath was attached to pressurized and heparinized saline bag for constant forward flow. A coaxial system was then advanced over the 035 wire. This included a 110 cm Zoom 088 catheter with coaxial 125cm Berenstein diagnostic catheter. This was advanced to the proximal descending thoracic aorta. Wire was then removed. Double flush of  the catheter was performed. Catheter was then used to select the innominate artery and the common carotid artery. The catheter combination was then navigated into the cervical ICA segment. The Berenstein catheter and the Glidewire were removed. Angiogram was performed. Road map function was used once the occluded vessel was identified. Copious back flush was performed and the 088 catheter was attached to heparinized and pressurized saline bag for forward flow. A second coaxial system was then advanced through the balloon catheter, which included the selected intermediate catheter, microcatheter, and microwire. In this scenario, the set up included a 137 cm 071 Zoom catheter, a Trevo Provue18 microcatheter, and 014 synchro soft wire. This system was advanced through the guide catheter under the road-map function, with adequate back-flush at the rotating hemostatic valve at that back end of the balloon guide. Microcatheter and the intermediate catheter system were advanced through the terminal ICA and MCA to the level of the occlusion. The micro wire in the microcatheter were then removed. These 071 Zoom catheter was then advanced to the face of the occlusion for the first pass attempt, adapt technique. Catheter was withdrawn while at the same time the 088 distal access catheter was advanced through the carotid siphon to the terminus. Angiogram confirmed persistent occlusion. The 071 catheter was then readvanced through the distal access catheter with a coaxial microwire and  microcatheter. Microcatheter and the microwire were then gently navigated through the occlusion into angular branch of the MCA. Microcatheter was advanced through the occlusion. Wire was removed. Blood was then aspirated through the hub of the microcatheter, and a gentle contrast injection was performed confirming intraluminal position. A rotating hemostatic valve was then attached to the back end of the microcatheter, and a pressurized and heparinized saline bag was attached to the catheter. 4 x 40 solitaire device was then selected. Back flush was achieved at the rotating hemostatic valve, and then the device was gently advanced through the microcatheter to the distal end. The retriever was then unsheathed by withdrawing the microcatheter under fluoroscopy. Once the retriever was completely unsheathed, the microcatheter was carefully stripped from the delivery device. Control angiogram was performed from the intermediate catheter. A 3 minute time interval was observed. Solitaire stent was then withdrawn to the 071 catheter under fluoroscopy. Once the retriever was "corked" within the tip of the intermediate catheter, both were removed from the system. Free aspiration was confirmed at the hub of the 088 distal access catheter, with free blood return confirmed. Control angiogram was performed. Partial occlusion remained with TICI 2b flow at this point. We then advanced the zoom catheter with the microwire and microcatheter combination for a third pass attempt, using the adapt technique. 071 catheter was gently advanced to the face of the clot, and aspiration was performed. Once the catheter was withdrawn into the base 088 distal guide catheter, aspiration was performed at the hub of the 088 catheter. Angiogram was performed. Partial occlusion remained with TICI 2b flow at this point. We then proceeded with a fourth pass. The 071 catheter was then readvanced through the distal access catheter with a coaxial microwire  and microcatheter. Microcatheter and the microwire were then gently navigated to the face of the occlusion. The synchro soft wire would not advance through the occlusion. A headliner J wire 012 was then used through the microcatheter, successful in passing through the occlusion into the angular branch. Microcatheter was advanced through the occlusion. Wire was removed. Blood was then aspirated through the hub of the microcatheter, and  a gentle contrast injection was performed confirming intraluminal position. A rotating hemostatic valve was then attached to the back end of the microcatheter, and a pressurized and heparinized saline bag was attached to the catheter. 5 mm by 37 mm EMBO trapped was then selected. Back flush was achieved at the rotating hemostatic valve, and then the device was gently advanced through the microcatheter to the distal end. The retriever was then unsheathed by withdrawing the microcatheter under fluoroscopy. Once the retriever was completely unsheathed, the microcatheter was carefully stripped from the delivery device. Control angiogram was performed from the intermediate catheter. This control angiogram confirmed flow through the EMBO trap, with a lesion at the site of the occlusion, representing native intracranial atherosclerosis. While the stent tree vert remained open for proximally 5 minutes, we had the anesthesia team pass and OG tube. We administered 650 mg of aspirin. Estimation of the diameter length of the lesion were performed. We selected a drug-eluting balloon mounted stent, 2.25 mm x 15 mm. After approximately 5 minutes, the stent tree vert was removed. Angiogram was performed confirming TICI TB flow, relatively unchanged. We then navigated the microcatheter and the microwire/synchro wire back through the 088 distal access catheter. This combination was successful in crossing the stenosis/lesion in the distal M1. The catheter micro wire were placed into the angular branch  and the wire was removed. Gentle injection confirmed location. A transcend wire was then placed through the microcatheter and the microcatheter was removed. A baseline angiogram was performed. We then deployed a balloon mounted drug-eluting stent. We selected a Onyx 2.50mm x 15mm stent. This was deployed with 7 atmosphere inflation. Balloon was deflated and withdrawn. Repeat angiogram was performed. TICI 3 was achieved. Wires and catheters were removed. The distal access catheter was withdrawn to the cervical ICA and repeat angiogram was performed of the cervical segment. The 088 catheter was then withdrawn. The skin at the puncture site was then cleaned with Chlorhexidine. The 8 French sheath was removed and an 71F angioseal was deployed. Flat panel CT was performed. Patient tolerated the procedure well and remained hemodynamically stable throughout. No complications were encountered and no significant blood loss encountered. IMPRESSION: Status post ultrasound guided access right common femoral artery for cervical and cerebral angiogram, mechanical thrombectomy of right M1 occlusion, and rescue stenting of native intracranial atherosclerotic permissive lesion with 2.25 mm x 15 mm drug-eluting stent, restoring TICI 3 flow. Angio-Seal for hemostasis. Signed, Yvone Neu. Reyne Dumas, RPVI [facility] Radiology PLAN: The patient will remain intubated. ICU status Target systolic blood pressure of 120-140 Right hip straight time 6 hours Frequent neurovascular checks Repeat neurologic imaging with CT and/MRI at the discretion of neurology team Dual anti-platelet therapy. Electronically Signed   By: Gilmer Mor D.O.   On: 10/30/2019 20:58   DG CHEST PORT 1 VIEW  Result Date: 10/31/2019 CLINICAL DATA:  CVA. EXAM: PORTABLE CHEST 1 VIEW COMPARISON:  None. FINDINGS: The endotracheal tube is 5.5 cm above the carina. The NG tube is coursing down the esophagus and into the  stomach. The cardiac silhouette, mediastinal and hilar contours are within normal limits given the AP projection and portable technique. No acute pulmonary findings. No pleural effusions or pneumothorax. The bony thorax is intact. IMPRESSION: No acute cardiopulmonary findings. Electronically Signed   By: Rudie Meyer M.D.   On: 10/31/2019 06:35   IR PERCUTANEOUS ART THROMBECTOMY/INFUSION INTRACRANIAL INC DIAG ANGIO  Result Date: 10/30/2019 INDICATION: 74 year old male with acute stroke, right  MCA M1 occlusion EXAM: ULTRASOUND GUIDED ACCESS RIGHT COMMON FEMORAL ARTERY CERVICAL AND CEREBRAL ANGIOGRAM MECHANICAL THROMBECTOMY RIGHT MCA/M1 RESCUE STENT OF RIGHT MCA INTRACRANIAL ATHEROSCLEROSIS ANGIO-SEAL FOR CLOSURE COMPARISON:  CT imaging same day MEDICATIONS: 3 mg intra arterial integral and, 650 mg aspirin OG, 300 mg Plavix OG ANESTHESIA/SEDATION: The anesthesia team was present to provide general endotracheal tube anesthesia and for patient monitoring during the procedure. Intubation was performed in negative pressure [place] in neuro IR holding. Interventional neuro radiology nursing staff was also present. CONTRAST:  170 cc Omnipaque 300 FLUOROSCOPY TIME:  Fluoroscopy Time: 40 minutes 42 seconds (3,316 mGy). COMPLICATIONS: None TECHNIQUE: Informed written consent was obtained from the patient's family after a thorough discussion of the procedural risks, benefits and alternatives. Specific risks discussed include: Bleeding, infection, contrast reaction, kidney injury/failure, need for further procedure/surgery, arterial injury or dissection, embolization to new territory, intracranial hemorrhage (10-15% risk), neurologic deterioration, cardiopulmonary collapse, death. All questions were addressed. Maximal Sterile Barrier Technique was utilized including during the procedure including caps, mask, sterile gowns, sterile gloves, sterile drape, hand hygiene and skin antiseptic. A timeout was performed prior to the  initiation of the procedure. The anesthesia team was present to provide general endotracheal tube anesthesia and for patient monitoring during the procedure. Interventional neuro radiology nursing staff was also present. FINDINGS: Initial Findings: Right internal carotid artery: Tortuosity of the cervical segment. Vertical and petrous segment patent with normal course caliber contour. Cavernous segment patent. Clinoid segment patent. Antegrade flow of the ophthalmic artery. Ophthalmic segment patent. Terminus patent. Right MCA: Proximal M1 segment is patent. There is a tapered cuff-off of the mid segment of the M1, in the region the mid orbit. Proximal temporal branch remains patent. TICI 0 flow through the M1 segment. There late filling with collaterals via the right anterior cerebral artery and the right posterior cerebral artery via leptomeningeal collateral vessels. Patent anterior communicating artery with the temporal region supplied by the PCA. Right ACA: A 1 segment patent. A 2 segment perfuses the right territory. Patent anterior communicating artery with significant cross-filling of the left anterior cerebral artery. Completion Findings: During the mechanical thrombectomy, the initial 2 passes resulted in TICI 2B flow, with partial filling of greater than 50% of the right-sided hemispheric MCA branches. The cortical branches after the first 2 passes are filling both from leptomeningeal collaterals as well as through the attenuated M1 segment. The final pass with the EMBO trapped device uncovered atherosclerotic plaque at the distal M1 segment. This atherosclerotic plaque was then treated with drug-eluting balloon mounted stent. Right MCA: TICI 3 flow was restored through the right hemisphere. The stent was successful in restoring flow through the disease segment of the M1 Right common carotid artery:  Normal course caliber and contour. Right external carotid artery: Patent with antegrade flow. Flat panel  CT demonstrates minimal staining within the supra ganglionic right MCA territory with no subarachnoid hemorrhage or large parenchymal hemorrhage. PROCEDURE: Patient was brought to the [facility] suite, and identity was confirmed. Patient was prepped and draped in the usual sterile fashion. Ultrasound survey of the right inguinal region was performed with images stored and sent to PACs. 11 blade scalpel was used to make a small incision. Blunt dissection was performed with US guidance. A micropuncture needle was used access the right common femoral artery under ultrasound. With excellent arterial blood flow returned, an .018 micro wire was passed through the needle, observed to enter the abdominal aorta under fluoroscopy. The needle was removed, and a micropuncture  sheath was placed over the wire. The inner dilator and wire were removed, and an 035 wire was advanced under fluoroscopy into the abdominal aorta. The sheath was removed and a 25cm 79F straight vascular sheath was placed. The dilator was removed and the sheath was flushed. Limited angiogram was performed. Sheath was attached to pressurized and heparinized saline bag for constant forward flow. A coaxial system was then advanced over the 035 wire. This included a 110 cm Zoom 088 catheter with coaxial 125cm Berenstein diagnostic catheter. This was advanced to the proximal descending thoracic aorta. Wire was then removed. Double flush of the catheter was performed. Catheter was then used to select the innominate artery and the common carotid artery. The catheter combination was then navigated into the cervical ICA segment. The Berenstein catheter and the Glidewire were removed. Angiogram was performed. Road map function was used once the occluded vessel was identified. Copious back flush was performed and the 088 catheter was attached to heparinized and pressurized saline bag for forward flow. A second coaxial system was then advanced through the balloon catheter,  which included the selected intermediate catheter, microcatheter, and microwire. In this scenario, the set up included a 137 cm 071 Zoom catheter, a Trevo Provue18 microcatheter, and 014 synchro soft wire. This system was advanced through the guide catheter under the road-map function, with adequate back-flush at the rotating hemostatic valve at that back end of the balloon guide. Microcatheter and the intermediate catheter system were advanced through the terminal ICA and MCA to the level of the occlusion. The micro wire in the microcatheter were then removed. These 071 Zoom catheter was then advanced to the face of the occlusion for the first pass attempt, adapt technique. Catheter was withdrawn while at the same time the 088 distal access catheter was advanced through the carotid siphon to the terminus. Angiogram confirmed persistent occlusion. The 071 catheter was then readvanced through the distal access catheter with a coaxial microwire and microcatheter. Microcatheter and the microwire were then gently navigated through the occlusion into angular branch of the MCA. Microcatheter was advanced through the occlusion. Wire was removed. Blood was then aspirated through the hub of the microcatheter, and a gentle contrast injection was performed confirming intraluminal position. A rotating hemostatic valve was then attached to the back end of the microcatheter, and a pressurized and heparinized saline bag was attached to the catheter. 4 x 40 solitaire device was then selected. Back flush was achieved at the rotating hemostatic valve, and then the device was gently advanced through the microcatheter to the distal end. The retriever was then unsheathed by withdrawing the microcatheter under fluoroscopy. Once the retriever was completely unsheathed, the microcatheter was carefully stripped from the delivery device. Control angiogram was performed from the intermediate catheter. A 3 minute time interval was observed.  Solitaire stent was then withdrawn to the 071 catheter under fluoroscopy. Once the retriever was "corked" within the tip of the intermediate catheter, both were removed from the system. Free aspiration was confirmed at the hub of the 088 distal access catheter, with free blood return confirmed. Control angiogram was performed. Partial occlusion remained with TICI 2b flow at this point. We then advanced the zoom catheter with the microwire and microcatheter combination for a third pass attempt, using the adapt technique. 071 catheter was gently advanced to the face of the clot, and aspiration was performed. Once the catheter was withdrawn into the base 088 distal guide catheter, aspiration was performed at the hub of the  088 catheter. Angiogram was performed. Partial occlusion remained with TICI 2b flow at this point. We then proceeded with a fourth pass. The 071 catheter was then readvanced through the distal access catheter with a coaxial microwire and microcatheter. Microcatheter and the microwire were then gently navigated to the face of the occlusion. The synchro soft wire would not advance through the occlusion. A headliner J wire 012 was then used through the microcatheter, successful in passing through the occlusion into the angular branch. Microcatheter was advanced through the occlusion. Wire was removed. Blood was then aspirated through the hub of the microcatheter, and a gentle contrast injection was performed confirming intraluminal position. A rotating hemostatic valve was then attached to the back end of the microcatheter, and a pressurized and heparinized saline bag was attached to the catheter. 5 mm by 37 mm EMBO trapped was then selected. Back flush was achieved at the rotating hemostatic valve, and then the device was gently advanced through the microcatheter to the distal end. The retriever was then unsheathed by withdrawing the microcatheter under fluoroscopy. Once the retriever was completely  unsheathed, the microcatheter was carefully stripped from the delivery device. Control angiogram was performed from the intermediate catheter. This control angiogram confirmed flow through the EMBO trap, with a lesion at the site of the occlusion, representing native intracranial atherosclerosis. While the stent tree vert remained open for proximally 5 minutes, we had the anesthesia team pass and OG tube. We administered 650 mg of aspirin. Estimation of the diameter length of the lesion were performed. We selected a drug-eluting balloon mounted stent, 2.25 mm x 15 mm. After approximately 5 minutes, the stent tree vert was removed. Angiogram was performed confirming TICI TB flow, relatively unchanged. We then navigated the microcatheter and the microwire/synchro wire back through the 088 distal access catheter. This combination was successful in crossing the stenosis/lesion in the distal M1. The catheter micro wire were placed into the angular branch and the wire was removed. Gentle injection confirmed location. A transcend wire was then placed through the microcatheter and the microcatheter was removed. A baseline angiogram was performed. We then deployed a balloon mounted drug-eluting stent. We selected a Onyx 2.39mm x 15mm stent. This was deployed with 7 atmosphere inflation. Balloon was deflated and withdrawn. Repeat angiogram was performed. TICI 3 was achieved. Wires and catheters were removed. The distal access catheter was withdrawn to the cervical ICA and repeat angiogram was performed of the cervical segment. The 088 catheter was then withdrawn. The skin at the puncture site was then cleaned with Chlorhexidine. The 8 French sheath was removed and an 42F angioseal was deployed. Flat panel CT was performed. Patient tolerated the procedure well and remained hemodynamically stable throughout. No complications were encountered and no significant blood loss encountered. IMPRESSION: Status post ultrasound guided  access right common femoral artery for cervical and cerebral angiogram, mechanical thrombectomy of right M1 occlusion, and rescue stenting of native intracranial atherosclerotic permissive lesion with 2.25 mm x 15 mm drug-eluting stent, restoring TICI 3 flow. Angio-Seal for hemostasis. Signed, Yvone Neu. Reyne Dumas, RPVI Vascular and Interventional Radiology Specialists [facility] Radiology PLAN: The patient will remain intubated. ICU status Target systolic blood pressure of 120-140 Right hip straight time 6 hours Frequent neurovascular checks Repeat neurologic imaging with CT and/MRI at the discretion of neurology team Dual anti-platelet therapy. Electronically Signed   By: Gilmer Mor D.O.   On: 10/30/2019 20:58   CT HEAD CODE STROKE WO CONTRAST`  Addendum Date: 10/30/2019  ADDENDUM REPORT: 10/30/2019 15:38 ADDENDUM: Study discussed by telephone with Dr. Chesley Noon on 10/30/2019 at 1532 hours. Electronically Signed   By: Odessa Fleming M.D.   On: 10/30/2019 15:38   Result Date: 10/30/2019 CLINICAL DATA:  Code stroke. 74 year old male last known well 1100 hours. Right gaze deviation and aphasia. EXAM: CT HEAD WITHOUT CONTRAST TECHNIQUE: Contiguous axial images were obtained from the base of the skull through the vertex without intravenous contrast. COMPARISON:  Head CT 03/21/2018. FINDINGS: Brain: Stable cerebral volume. No midline shift, mass effect, or evidence of intracranial mass lesion. No ventriculomegaly. No acute intracranial hemorrhage identified. Advanced chronic small vessel ischemia in the bilateral corona radiata and deep gray nuclei, with Patchy and confluent bilateral hypodensity more so on the left. Some associated ex vacuo enlargement of the left lateral ventricle. But gray-white matter differentiation appears stable since 2019. No cortically based acute infarct identified. No cortical encephalomalacia identified. Vascular: Mild Calcified atherosclerosis at the skull base. No suspicious  intracranial vascular hyperdensity. Skull: Stable, negative. Sinuses/Orbits: Chronic bubbly opacity in the left sphenoid. Other Visualized paranasal sinuses and mastoids are stable and well pneumatized. Other: Rightward gaze deviation. No acute scalp soft tissue finding. ASPECTS [facility] Stroke Program Early CT Score) Total score (0-10 with 10 being normal): 10 (chronic encephalomalacia). IMPRESSION: 1. Advanced chronic small vessel disease appears stable by CT since 2019. 2. No acute cortically based infarct or acute intracranial hemorrhage identified. ASPECTS 10. Electronically Signed: By: Odessa Fleming M.D. On: 10/30/2019 15:25    Labs:  CBC: Recent Labs    10/30/19 1527 10/30/19 2134  WBC 6.3  --   HGB 14.0 11.6*  HCT 44.0 34.0*  PLT 179  --     COAGS: Recent Labs    10/30/19 1527  INR 1.0  APTT 33    BMP: Recent Labs    10/30/19 1527 10/30/19 2134  NA 138 138  K 3.9 3.7  CL 105  --   CO2 25  --   GLUCOSE 131*  --   BUN 16  --   CALCIUM 8.9  --   CREATININE 0.87  --   GFRNONAA >60  --   GFRAA >60  --     LIVER FUNCTION TESTS: Recent Labs    10/30/19 1527  BILITOT 0.8  AST 23  ALT 31  ALKPHOS 81  PROT 7.4  ALBUMIN 3.7    Assessment and Plan:  History of CVA s/p cerebral arteriogram with emergent mechanical thrombectomy of right MCA M1 occlusion along with rescue stenting of right MCA M1 underlying stenosis achieving a TICI 3 revascularization 10/30/2019 by Dr. Loreta Ave. Patient's condition improving- remains intubated/sedated, opens eyes to voice and follows simple commands, moving all 4s with weakness of left side. Right groin incision stable, distal pulses (DPs) 1+ bilaterally. For MRI/MRA and possible extubation today. Continue taking Plavix 75 mg once daily and Aspirin 81 mg once daily. Further plans per neurology/CCM- appreciate and agree with management. NIR to follow.   Electronically Signed: Elwin Mocha, PA-C 10/31/2019, 11:15 AM   I spent a  total of 25 Minutes at the the patient's bedside AND on the patient's hospital floor or unit, greater than 50% of which was counseling/coordinating care for CVA s/p revascularization.

## 2019-10-31 NOTE — Consult Note (Signed)
NAME:  Jeffery Reilly, MRN:  [id number], DOB:  [birth year], LOS: 1 ADMISSION DATE:  10/30/2019, CONSULTATION DATE:  10/30/19 REFERRING MD: Wilford Corner, CHIEF COMPLAINT:  aphasia  Brief History   74 year old man with hx of stroke, HTN, here with R M1 MCA stroke, s/p mechanical thrombectomy and stent on 5/18.   History of present illness    Presents with intermittent aphasia, waxing and waning on 5/18, finally persistent aphasia at around 2pm. Presented to [facility] ER.  Aphasic and L sided neglect on presentation to ED but alert.    On presentation, some RUE seizure like movements, increased tone.  Given ativan and Keppra, some decrease in abnormal movements but persistent aphasia and neglect.  CTA revealed R M1 MCA occlusion.  Transferred to [facility] for IR mechanical thrombectomy and stent.    Past Medical History  Hx L MCA CVA, r sided weakness HTN  Hx abdominal surgery  Significant Hospital Events   5/18 mechanical thrombectomy and stent R M1 MCA  Consults:  PCCM IR  Neurology primary  Procedures:  5/18 intubation for procedure  Significant Diagnostic Tests:  CT 5/18 - no hemorrhage, right MCA occlusion.  EEG 5/19 - slow but no seizures.  Micro Data:  none  Antimicrobials:  none  Interim history/subjective:  Somnolent but will follow commands today.   Objective   Blood pressure (!) 160/93, pulse (!) 113, temperature 99.4 F (37.4 C), temperature source Axillary, resp. rate 18, SpO2 100 %.    Vent Mode: CPAP;PSV FiO2 (%):  [40 %-60 %] 40 % Set Rate:  [14 bmp] 14 bmp Vt Set:  [580 mL] 580 mL PEEP:  [5 cmH20] 5 cmH20 Pressure Support:  [10 cmH20] 10 cmH20 Plateau Pressure:  [9 cmH20-18 cmH20] 9 cmH20   Intake/Output Summary (Last 24 hours) at 10/31/2019 1103 Last data filed at 10/31/2019 1000 Gross per 24 hour  Intake 2368.76 ml  Output 260 ml  Net 2108.76 ml   There were no vitals filed for this visit.  Examination: General: NAD, somnolent, intubated HENT: ETT, OGT in  place Lungs: CTAB. Tolerated PSV  Cardiovascular: bradycardic, no mgr Abdomen: NT, ND, NBS Extremities: B LE edema, chronic appearing skin changes.   Neuro: following 2-step commands predictably on right side, weaker on right. GU: Foley.  Resolved Hospital Problem list     Assessment & Plan:   Critically ill due to acute hypoxic respiratory failure requiring mechanical ventilation.  - extubation today. - progressive mobilization  Critically ill due to acute ischemic stroke from right MCA occlusion post intervention requiring titration of clevidepine  - add labetalol if unable to maintain BP goals following extubation.  - for MRI today  Secondary stroke prevention work up as per neurology.   Daily Goals Checklist  Pain/Anxiety/Delirium protocol (if indicated): propofol off Neuro vitals: every 2 hours AED's: none VAP protocol (if indicated): extubated  Respiratory support goals: progressive mobilization. Blood pressure target:  120-140 DVT prophylaxis: heparin [place] tid. Nutrition Status: swallow evaluation today.  GI prophylaxis: not indicated. Fluid status goals: allow autoregulation.  Urinary catheter: external catheter Central lines: PIV only Glucose control: euglycemic T2DM on no treatment Mobility/therapy needs: PT/OT/SLP Antibiotic de-escalation: none Home medication reconciliation: on hold Daily labs: BMET Code Status: full Family Communication: Stroke service to update Disposition: ICU.  Labs   CBC: Recent Labs  Lab 10/30/19 1527 10/30/19 2134  WBC 6.3  --   NEUTROABS 3.5  --   HGB 14.0 11.6*  HCT 44.0 34.0*  MCV 85.8  --  PLT 179  --     Basic Metabolic Panel: Recent Labs  Lab 10/30/19 1527 10/30/19 2134  NA 138 138  K 3.9 3.7  CL 105  --   CO2 25  --   GLUCOSE 131*  --   BUN 16  --   CREATININE 0.87  --   CALCIUM 8.9  --    GFR: Estimated Creatinine Clearance: 87.5 mL/min (by C-G formula based on SCr of 0.87 mg/dL). Recent Labs  Lab  10/30/19 1527  WBC 6.3    Liver Function Tests: Recent Labs  Lab 10/30/19 1527  AST 23  ALT 31  ALKPHOS 81  BILITOT 0.8  PROT 7.4  ALBUMIN 3.7   No results for input(s): LIPASE, AMYLASE in the last 168 hours. No results for input(s): AMMONIA in the last 168 hours.  ABG    Component Value Date/Time   PHART 7.412 10/30/2019 2134   PCO2ART 37.7 10/30/2019 2134   PO2ART 181 (H) 10/30/2019 2134   HCO3 24.1 10/30/2019 2134   TCO2 25 10/30/2019 2134   O2SAT 100.0 10/30/2019 2134     Coagulation Profile: Recent Labs  Lab 10/30/19 1527  INR 1.0    Cardiac Enzymes: No results for input(s): CKTOTAL, CKMB, CKMBINDEX, TROPONINI in the last 168 hours.  HbA1C: No results found for: HGBA1C  CBG: Recent Labs  Lab 10/30/19 2310 10/31/19 0336 10/31/19 0747  GLUCAP 119* 168* 165*      CRITICAL CARE Performed by: Kipp Brood   Total critical care time: 40 minutes  Critical care time was exclusive of separately billable procedures and treating other patients.  Critical care was necessary to treat or prevent imminent or life-threatening deterioration.  Critical care was time spent personally by me on the following activities: development of treatment plan with patient and/or surrogate as well as nursing, discussions with consultants, evaluation of patient's response to treatment, examination of patient, obtaining history from patient or surrogate, ordering and performing treatments and interventions, ordering and review of laboratory studies, ordering and review of radiographic studies, pulse oximetry, re-evaluation of patient's condition and participation in multidisciplinary rounds.  Kipp Brood, MD [facility] ICU Physician [place]  Pager: [phone] Mobile: [phone] After hours: [phone].

## 2019-10-31 NOTE — Procedures (Signed)
Patient Name: Jeffery Reilly  MRN: [id number]  Epilepsy Attending: Charlsie Quest  Referring Physician/Provider: Dr Marvel Plan Date: 10/31/2019 Duration: 23.40 mins  Patient history: 74 y.o. male with history of L MCA stroke with resultant R sided weakness and HTN, presented with transient recurrent episodes of aphasia and automatisms of R arm with increased tone in all extremities. EEG to evaluate for seizure.   Level of alertness: Awake  AEDs during EEG study: Propofol  Technical aspects: This EEG study was done with scalp electrodes positioned according to the 10-20 International system of electrode placement. Electrical activity was acquired at a sampling rate of 500Hz  and reviewed with a high frequency filter of 70Hz  and a low frequency filter of 1Hz . EEG data were recorded continuously and digitally stored.   Description:  No clear posterior dominant rhythm was seen. EEG showed continuous generalized 5-8 Hz theta-alpha activity.  Hyperventilation and photic stimulation were not performed.     ABNORMALITY -Continuous slow, generalized  IMPRESSION: This study is suggestive of mild diffuse encephalopathy, nonspecific etiology. No seizures or epileptiform discharges were seen throughout the recording.  Bradie Lacock 

## 2019-11-01 ENCOUNTER — Inpatient Hospital Stay (HOSPITAL_COMMUNITY): Payer: Medicare Other

## 2019-11-01 DIAGNOSIS — I6389 Other cerebral infarction: Secondary | ICD-10-CM

## 2019-11-01 DIAGNOSIS — E7800 Pure hypercholesterolemia, unspecified: Secondary | ICD-10-CM

## 2019-11-01 DIAGNOSIS — I639 Cerebral infarction, unspecified: Secondary | ICD-10-CM

## 2019-11-01 DIAGNOSIS — I10 Essential (primary) hypertension: Secondary | ICD-10-CM

## 2019-11-01 DIAGNOSIS — I63511 Cerebral infarction due to unspecified occlusion or stenosis of right middle cerebral artery: Secondary | ICD-10-CM

## 2019-11-01 LAB — BASIC METABOLIC PANEL
Anion gap: 7 (ref 5–15)
BUN: 8 mg/dL (ref 8–23)
CO2: 24 mmol/L (ref 22–32)
Calcium: 8.4 mg/dL — ABNORMAL LOW (ref 8.9–10.3)
Chloride: 111 mmol/L (ref 98–111)
Creatinine, Ser: 0.8 mg/dL (ref 0.61–1.24)
GFR calc Af Amer: >60 mL/min (ref >60)
GFR calc non Af Amer: >60 mL/min (ref >60)
Glucose, Bld: 137 mg/dL — ABNORMAL HIGH (ref 70–99)
Potassium: 3.6 mmol/L (ref 3.5–5.1)
Sodium: 142 mmol/L (ref 135–145)

## 2019-11-01 LAB — GLUCOSE, CAPILLARY
Glucose-Capillary: 127 mg/dL — ABNORMAL HIGH (ref 70–99)
Glucose-Capillary: 115 mg/dL — ABNORMAL HIGH (ref 70–99)
Glucose-Capillary: 112 mg/dL — ABNORMAL HIGH (ref 70–99)
Glucose-Capillary: 111 mg/dL — ABNORMAL HIGH (ref 70–99)
Glucose-Capillary: 98 mg/dL (ref 70–99)

## 2019-11-01 LAB — CBC
HCT: 38.9 % — ABNORMAL LOW (ref 39.0–52.0)
Hemoglobin: 12.3 g/dL — ABNORMAL LOW (ref 13.0–17.0)
MCH: 27.8 pg (ref 26.0–34.0)
MCHC: 31.6 g/dL (ref 30.0–36.0)
MCV: 87.8 fL (ref 80.0–100.0)
Platelets: 158 10*3/uL (ref 150–400)
RBC: 4.43 MIL/uL (ref 4.22–5.81)
RDW: 13.7 % (ref 11.5–15.5)
WBC: 7.5 10*3/uL (ref 4.0–10.5)
nRBC: 0 % (ref 0.0–0.2)

## 2019-11-01 LAB — ECHOCARDIOGRAM COMPLETE
Height: 70 in
Weight: 3460.34 oz

## 2019-11-01 LAB — TRIGLYCERIDES: Triglycerides: 240 mg/dL — ABNORMAL HIGH (ref <150)

## 2019-11-01 MED ORDER — HEPARIN SODIUM (PORCINE) 5000 UNIT/ML IJ SOLN
5000.0000 [IU] | Freq: Three times a day (TID) | INTRAMUSCULAR | Status: DC
  Administered 2019-11-01 – 2019-11-03 (×8): 5000 [IU] via SUBCUTANEOUS
  Filled 2019-11-01 (×8): qty 1

## 2019-11-01 MED ORDER — HYDRALAZINE HCL 20 MG/ML IJ SOLN: 5.0000 mg | INTRAMUSCULAR | Status: DC | PRN

## 2019-11-01 MED ORDER — ORAL CARE MOUTH RINSE
15.0000 mL | Freq: Two times a day (BID) | OROMUCOSAL | Status: DC
  Administered 2019-11-01 (×2): 15 mL via OROMUCOSAL

## 2019-11-01 MED ORDER — ATORVASTATIN CALCIUM 80 MG PO TABS
80.0000 mg | ORAL_TABLET | Freq: Every day | ORAL | Status: DC
  Administered 2019-11-02 – 2019-11-03 (×2): 80 mg via ORAL
  Filled 2019-11-01 (×2): qty 1

## 2019-11-01 MED ORDER — AMLODIPINE BESYLATE 10 MG PO TABS: 10.0000 mg | ORAL_TABLET | Freq: Every day | ORAL | Status: DC

## 2019-11-01 MED ORDER — LEVETIRACETAM 500 MG PO TABS
500.0000 mg | ORAL_TABLET | Freq: Two times a day (BID) | ORAL | Status: DC
  Administered 2019-11-01 – 2019-11-03 (×4): 500 mg via ORAL
  Filled 2019-11-01 (×4): qty 1

## 2019-11-01 MED ORDER — INSULIN ASPART 100 UNIT/ML ~~LOC~~ SOLN: 0.0000 [IU] | SUBCUTANEOUS | Status: DC

## 2019-11-01 MED ORDER — HYDRALAZINE HCL 20 MG/ML IJ SOLN: 10.0000 mg | Freq: Three times a day (TID) | INTRAMUSCULAR | Status: DC | PRN

## 2019-11-01 MED ORDER — LISINOPRIL 20 MG PO TABS
40.0000 mg | ORAL_TABLET | Freq: Every day | ORAL | Status: DC
  Administered 2019-11-02: 40 mg via ORAL
  Filled 2019-11-01: qty 2

## 2019-11-01 NOTE — Progress Notes (Signed)
  Echocardiogram 2D Echocardiogram has been performed.  Justene Jensen G Yoniel Arkwright 11/01/2019, 11:00 AM

## 2019-11-01 NOTE — Progress Notes (Signed)
SLP Cancellation Note  Patient Details Name: Jeffery Reilly MRN: [id number] DOB: 1946/01/18   Cancelled treatment:       Reason Eval/Treat Not Completed: Patient at procedure or test/unavailable. Ultrasound just getting started. Will f/u likely in pm   Arra Connaughton, Riley Nearing 11/01/2019, 10:11 AM

## 2019-11-01 NOTE — Evaluation (Signed)
Clinical/Bedside Swallow Evaluation Patient Details  Name: Jeffery Reilly MRN: [id number] Date of Birth: October 05, 1945  Today's Date: 11/01/2019 Time: SLP Start Time (ACUTE ONLY): 1400 SLP Stop Time (ACUTE ONLY): 1430 SLP Time Calculation (min) (ACUTE ONLY): 30 min  Past Medical History:  Past Medical History:  Diagnosis Date  . Stroke [facility])    Past Surgical History:  Past Surgical History:  Procedure Laterality Date  . ABDOMINAL SURGERY    . IR CT HEAD LTD  10/30/2019  . IR INTRA CRAN STENT  10/30/2019  . IR PERCUTANEOUS ART THROMBECTOMY/INFUSION INTRACRANIAL INC DIAG ANGIO  10/30/2019  . IR US GUIDE VASC ACCESS RIGHT  10/30/2019  . RADIOLOGY WITH ANESTHESIA N/A 10/30/2019   Procedure: IR WITH ANESTHESIA;  Surgeon: Gilmer Mor, DO;  Location: [facility] OR;  Service: Radiology;  Laterality: N/A;   HPI:  74 y.o. male past medical history of mild residual right hemiparesis from a left cerebral hemispheric stroke  Patient presented to [facility] for confusion/aphasia. Pt with aphasia, R gaze deviation and L weakness with possible neglect. CTA head/neck demonstrating R MCA occlusion. Pt underwent R MCA stenting on 5/18, intubated until 5/19.    Assessment / Plan / Recommendation Clinical Impression  Pt demonstrates a moderate oral dysphagia; pt is suspected to have had dysphagia resulting from prior stroke though it is possible that the current stroke has increased pts impairment. He is noted to have lingual deviation to the right, moderate dysarthria, poor secretion management with drooling and oral residue with PO trials.  Addiontally, pt had delayed coughing with thin liquids. Trials of nectar and puree did not result in immediate signs of aspiraiton, but again oral dysphagia was significant. Pt claims that drooling is normal for him and his wife can barely understand him at home, but he doesnt have trouble eating and drinking.  He also has decreased awareness and new cognitive impairment so he is  not a reliable historian and wife has not answered calls today. Recommend pt consume meads crushed in puree and be offered sips of nectar thick liquids by RN, both follwoed by oral suction. Will f/u tomorrow for instrumental testing.  SLP Visit Diagnosis: Dysphagia, oropharyngeal phase (R13.12)    Aspiration Risk  Moderate aspiration risk    Diet Recommendation Other (Comment);Pudding-thick liquid;Free water protocol after oral care;Regular(sips of nectar, bites of puree)   Liquid Administration via: Spoon;Straw Medication Administration: Crushed with puree Supervision: Full supervision/cueing for compensatory strategies Compensations: Slow rate;Small sips/bites Postural Changes: Seated upright at 90 degrees;Remain upright for at least 30 minutes after po intake    Other  Recommendations Oral Care Recommendations: Oral care before and after PO Other Recommendations: Have oral suction available   Follow up Recommendations Inpatient Rehab      Frequency and Duration            Prognosis        Swallow Study   General HPI: 74 y.o. male past medical history of mild residual right hemiparesis from a left cerebral hemispheric stroke  Patient presented to [facility] for confusion/aphasia. Pt with aphasia, R gaze deviation and L weakness with possible neglect. CTA head/neck demonstrating R MCA occlusion. Pt underwent R MCA stenting on 5/18, intubated until 5/19.  Type of Study: Bedside Swallow Evaluation Previous Swallow Assessment: none in chart Diet Prior to this Study: NPO Temperature Spikes Noted: No Respiratory Status: Nasal cannula History of Recent Intubation: Yes Length of Intubations (days): 2 days Date extubated: 10/31/19 Behavior/Cognition: Alert;Cooperative;Pleasant mood Oral Cavity Assessment:  Excessive secretions Oral Care Completed by SLP: Yes Oral Cavity - Dentition: Adequate natural dentition Vision: Functional for self-feeding Self-Feeding Abilities: Needs  assist Patient Positioning: Upright in chair Baseline Vocal Quality: Normal Volitional Cough: Cognitively unable to elicit Volitional Swallow: Unable to elicit    Oral/Motor/Sensory Function Overall Oral Motor/Sensory Function: Severe impairment Facial Symmetry: Abnormal symmetry right;Suspected CN VII (facial) dysfunction Facial Strength: Reduced right;Suspected CN VII (facial) dysfunction Lingual ROM: Reduced right;Suspected CN XII (hypoglossal) dysfunction Lingual Symmetry: Abnormal symmetry right;Suspected CN XII (hypoglossal) dysfunction Lingual Strength: Reduced;Suspected CN XII (hypoglossal) dysfunction   Ice Chips Ice chips: Impaired Presentation: Spoon Oral Phase Impairments: Reduced lingual movement/coordination;Impaired mastication Oral Phase Functional Implications: Prolonged oral transit   Thin Liquid Thin Liquid: Impaired Presentation: Cup;Straw Oral Phase Impairments: Reduced lingual movement/coordination Oral Phase Functional Implications: Oral residue Pharyngeal  Phase Impairments: Cough - Delayed    Nectar Thick Nectar Thick Liquid: Impaired Presentation: Straw Oral Phase Impairments: Reduced lingual movement/coordination Oral phase functional implications: Oral residue   Honey Thick Honey Thick Liquid: Not tested   Puree Puree: Impaired Presentation: Spoon Oral Phase Impairments: Reduced lingual movement/coordination Oral Phase Functional Implications: Oral residue   Solid     Solid: Not tested      Etoy Mcdonnell, Katherene Ponto 11/01/2019,3:13 PM

## 2019-11-01 NOTE — Progress Notes (Signed)
Occupational Therapy Evaluation Patient Details Name: Jeffery Reilly MRN: [id number] DOB: 1946-05-19 Today's Date: 11/01/2019    History of Present Illness 74 y.o. male past medical history of mild residual right hemiparesis from a left cerebral hemispheric stroke  Patient presented to [facility] for confusion/aphasia. Pt with aphasia, R gaze deviation and L weakness with possible neglect. CTA head/neck demonstrating R MCA occlusion. Pt underwent R MCA stenting on 5/18. Pt remains intubated after procedure. and extubated 5/19.    Clinical Impression   PTA, pt lived at home with his wife and states he was independent with ADL and mobility and raised hogs. Currently requires Mod A with stand pivot and +2 assistance with stand pivot transfer. Requires mod to Max A with ADL tasks. Recommend rehab at CIR to maximize functional level of independence to safely return home with 24/7 S. Will follow acutely.     Follow Up Recommendations  CIR;Supervision/Assistance - 24 hour    Equipment Recommendations  3 in 1 bedside commode    Recommendations for Other Services Rehab consult     Precautions / Restrictions Precautions Precautions: Fall      Mobility Bed Mobility Overal bed mobility: Needs Assistance       Supine to sit: Mod assist        Transfers Overall transfer level: Needs assistance Equipment used: Rolling walker (2 wheeled) Transfers: Sit to/from [facility] Sit to Stand: Mod assist Stand pivot transfers: Mod assist;+2 safety/equipment            Balance Overall balance assessment: Needs assistance   Sitting balance-Leahy Scale: Fair       Standing balance-Leahy Scale: Poor                             ADL either performed or assessed with clinical judgement   ADL Overall ADL's : Needs assistance/impaired Eating/Feeding: NPO   Grooming: Minimal assistance;Sitting   Upper Body Bathing: Minimal assistance;Sitting   Lower  Body Bathing: Maximal assistance;Sit to/from stand   Upper Body Dressing : Moderate assistance;Sitting Upper Body Dressing Details (indicate cue type and reason): unaware that LUE was not in gown Lower Body Dressing: Maximal assistance;Sit to/from stand   Toilet Transfer: Moderate assistance;Stand-pivot   Toileting- Clothing Manipulation and Hygiene: Maximal assistance       Functional mobility during ADLs: Moderate assistance;Rolling walker;Cueing for safety;Cueing for sequencing       Vision   Vision Assessment?: Yes Eye Alignment: Within Functional Limits Ocular Range of Motion: Within Functional Limits Alignment/Gaze Preference: Within Defined Limits Saccades: Additional head turns occurred during testing Visual Fields: Impaired-to be further tested in functional context Depth Perception: Overshoots Additional Comments: May have decreased vision L field. will further assess     Perception Perception Comments: Decreased spatial awareness; L inattention    Praxis      Pertinent Vitals/Pain Pain Assessment: No/denies pain     Hand Dominance Right   Extremity/Trunk Assessment Upper Extremity Assessment Upper Extremity Assessment: RUE deficits/detail;LUE deficits/detail RUE Deficits / Details: ? RUE deficits at baeline; dysmetric movemetns but uses functionally RUE Coordination: decreased fine motor LUE Deficits / Details: moving in isolated movement patterns; genrealized weakness LUE Coordination: decreased fine motor   Lower Extremity Assessment Lower Extremity Assessment: Defer to PT evaluation   Cervical / Trunk Assessment Cervical / Trunk Assessment: Kyphotic   Communication Communication Communication: Expressive difficulties   Cognition Arousal/Alertness: Awake/alert Behavior During Therapy: WFL for tasks assessed/performed Overall Cognitive  Status: Impaired/Different from baseline Area of Impairment: Orientation;Attention;Memory;Following  commands;Safety/judgement;Awareness;Problem solving                 Orientation Level: Disoriented to;Time Current Attention Level: Sustained Memory: Decreased recall of precautions;Decreased short-term memory Following Commands: Follows one step commands with increased time Safety/Judgement: Decreased awareness of safety;Decreased awareness of deficits Awareness: Intellectual Problem Solving: Slow processing;Difficulty sequencing;Requires verbal cues     General Comments  On entry, pt trying to find his wallet in his pants while laying in the bed in his hospital gown    Exercises     Shoulder Instructions      Home Living Family/patient expects to be discharged to:: Private residence Living Arrangements: Spouse/significant other Available Help at Discharge: Family;Available 24 hours/day Type of Home: House Home Access: Stairs to enter [employer] of Steps: 4 Entrance Stairs-Rails: Can reach both Home Layout: One level     Bathroom Shower/Tub: Chief Strategy Officer: Standard Bathroom Accessibility: Yes How Accessible: Accessible via walker Home Equipment: Cane - single point;Shower seat      Lives With: Spouse    Prior Functioning/Environment Level of Independence: Independent        Comments: pt ambulates household and limited community distances, raises hogs        OT Problem List: Decreased strength;Decreased range of motion;Decreased activity tolerance;Impaired balance (sitting and/or standing);Impaired vision/perception;Decreased coordination;Decreased cognition;Decreased safety awareness;Decreased knowledge of use of DME or AE;Decreased knowledge of precautions;Impaired tone;Impaired UE functional use;Obesity;Increased edema      OT Treatment/Interventions: Self-care/ADL training;Therapeutic exercise;Neuromuscular education;DME and/or AE instruction;Therapeutic activities;Cognitive remediation/compensation;Visual/perceptual  remediation/compensation;Patient/family education;Balance training    OT Goals(Current goals can be found in the care plan section) Acute Rehab OT Goals Patient Stated Goal: to get better adn go home to live with his wife OT Goal Formulation: With patient Time For Goal Achievement: 11/15/19 Potential to Achieve Goals: Good  OT Frequency: Min 2X/week   Barriers to D/C:            Co-evaluation              AM-PAC OT "6 Clicks" Daily Activity     Outcome Measure Help from another person eating meals?: Total Help from another person taking care of personal grooming?: A Little Help from another person toileting, which includes using toliet, bedpan, or urinal?: Total Help from another person bathing (including washing, rinsing, drying)?: A Lot Help from another person to put on and taking off regular upper body clothing?: A Lot Help from another person to put on and taking off regular lower body clothing?: A Lot 6 Click Score: 11   End of Session Equipment Utilized During Treatment: Gait belt;Rolling walker Nurse Communication: Mobility status  Activity Tolerance: Patient tolerated treatment well Patient left: in chair;with call bell/phone within reach;with chair alarm set  OT Visit Diagnosis: Other abnormalities of gait and mobility (R26.89);Unsteadiness on feet (R26.81);Muscle weakness (generalized) (M62.81);Other symptoms and signs involving cognitive function                Time: 1232-1259 OT Time Calculation (min): 27 min Charges:  OT General Charges $OT Visit: 1 Visit OT Evaluation $OT Eval Moderate Complexity: 1 Mod OT Treatments $Self Care/Home Management : 8-22 mins  Luisa Dago, OT/L   Acute OT Clinical Specialist Acute Rehabilitation Services Pager [phone] Office [phone]   [facility] 11/01/2019, 4:14 PM

## 2019-11-01 NOTE — Progress Notes (Signed)
STROKE TEAM PROGRESS NOTE   INTERVAL HISTORY Echo tech at bedside.  Patient extubated yesterday, tolerating well.  Awake, alert, not orientated to time, still has severe dysarthria, but moving all extremities.   Vitals:   11/01/19 0500 11/01/19 0600 11/01/19 0700 11/01/19 0800  BP:  101/60 122/83 127/81  Pulse: 84 64 82 67  Resp: 19 20 20 19   Temp:    97.9 F (36.6 C)  TempSrc:    Oral  SpO2: 93% 95% 99% 95%   CBC:  Recent Labs  Lab 10/30/19 1527 10/30/19 1527 10/30/19 2134 11/01/19 0500  WBC 6.3  --   --  7.5  NEUTROABS 3.5  --   --   --   HGB 14.0   < > 11.6* 12.3*  HCT 44.0   < > 34.0* 38.9*  MCV 85.8  --   --  87.8  PLT 179  --   --  158   < > = values in this interval not displayed.   Basic Metabolic Panel:  Recent Labs  Lab 10/30/19 1527 10/30/19 1527 10/30/19 2134 11/01/19 0500  NA 138   < > 138 142  K 3.9   < > 3.7 3.6  CL 105  --   --  111  CO2 25  --   --  24  GLUCOSE 131*  --   --  137*  BUN 16  --   --  8  CREATININE 0.87  --   --  0.80  CALCIUM 8.9  --   --  8.4*   < > = values in this interval not displayed.   Lipid Panel:     Component Value Date/Time   CHOL 249 (H) 10/31/2019 0551   TRIG 240 (H) 11/01/2019 0500   HDL 43 10/31/2019 0551   CHOLHDL 5.8 10/31/2019 0551   VLDL 63 (H) 10/31/2019 0551   LDLCALC 143 (H) 10/31/2019 0551   HgbA1c:  Lab Results  Component Value Date   HGBA1C 7.4 (H) 10/31/2019   Urine Drug Screen:     Component Value Date/Time   LABOPIA NONE DETECTED 10/31/2019 0832   COCAINSCRNUR NONE DETECTED 10/31/2019 0832   LABBENZ NONE DETECTED 10/31/2019 0832   AMPHETMU NONE DETECTED 10/31/2019 0832   THCU NONE DETECTED 10/31/2019 0832   LABBARB NONE DETECTED 10/31/2019 0832    Alcohol Level     Component Value Date/Time   ETH <10 10/30/2019 1527    IMAGING past 24 hours MR ANGIO HEAD WO CONTRAST  Result Date: 10/31/2019 CLINICAL DATA:  Stroke follow-up. EXAM: MRI HEAD WITHOUT CONTRAST MRA HEAD WITHOUT  CONTRAST TECHNIQUE: Multiplanar, multiecho pulse sequences of the brain and surrounding structures were obtained without intravenous contrast. Angiographic images of the head were obtained using MRA technique without contrast. COMPARISON:  Head CT Oct 30, 2019 FINDINGS: The study is degraded by motion. MRI HEAD FINDINGS Brain: Scattered foci of restricted diffusion is seen throughout the right MCA territory with a cluster at the temporal occipital region, consistent with acute/subacute infarcts. A focus of restricted diffusion is also seen right cerebellar hemisphere. Scattered and confluent foci of T2 hyperintensity are seen within the white matter of cerebral hemispheres, nonspecific, most likely related to chronic small vessel ischemia. Remote infarcts are seen in the bilateral corona radiata, bilateral basal ganglia and pons. Foci of susceptibility artifact are seen in the bilateral parietal regions, pons and right cerebellar hemisphere, most consistent with hemosiderin deposits. No acute hemorrhage, hydrocephalus, extra-axial collection or mass lesion. Vascular:  Normal flow voids. Skull and upper cervical spine: Reversal of the cervical curvature. There is diffuse decreased T1 signal within the visualized upper cervical spine, may represent red marrow reconversion versus marrow replacement. Correlate clinically. Sinuses/Orbits: Mucosal thickening throughout the paranasal sinuses with fluid level within the left sphenoid sinus. The orbits are grossly unremarkable. Other: None. MRA HEAD FINDINGS Visualized upper cervical and intracranial internal carotid arteries have normal flow related enhancement without evidence of occlusion or stenosis. Status post stenting of the mid/distal M1/MCA segment. Susceptibility artifact precludes evaluation of state patency. However, normal flow related enhancement is seen in most of the M2 branches, with the exception of one middle division branch which has attenuated flow  related enhancement (series 3, image 109). The bilateral ACA and left MCA vascular tree have normal flow related enhancement. The intracranial segment of the bilateral vertebral arteries, the basilar artery and bilateral posterior cerebral arteries have normal course and caliber and flow related enhancement without evidence of stenosis or occlusion. IMPRESSION: 1. Scattered small foci of restricted diffusion throughout the right MCA territory with a cluster at the temporal occipital region, consistent with acute/subacute infarcts. 2. A focus of restricted diffusion is also seen in the right cerebellar hemisphere. 3. Advanced chronic small vessel ischemia. Remote infarcts in the bilateral corona radiata, bilateral basal ganglia and pons. 4. Status post stenting of the mid/distal M1/MCA segment. Normal flow related enhancement is seen in most of the M2 branches, with the exception of one middle division branch which has attenuated flow related enhancement. 5. Diffuse decreased T1 signal within the visualized upper cervical spine, may represent red marrow reconversion versus marrow replacement. Correlate clinically. Electronically Signed   By: Baldemar Lenis M.D.   On: 10/31/2019 15:36   MR BRAIN WO CONTRAST  Result Date: 10/31/2019 CLINICAL DATA:  Stroke follow-up. EXAM: MRI HEAD WITHOUT CONTRAST MRA HEAD WITHOUT CONTRAST TECHNIQUE: Multiplanar, multiecho pulse sequences of the brain and surrounding structures were obtained without intravenous contrast. Angiographic images of the head were obtained using MRA technique without contrast. COMPARISON:  Head CT Oct 30, 2019 FINDINGS: The study is degraded by motion. MRI HEAD FINDINGS Brain: Scattered foci of restricted diffusion is seen throughout the right MCA territory with a cluster at the temporal occipital region, consistent with acute/subacute infarcts. A focus of restricted diffusion is also seen right cerebellar hemisphere. Scattered and  confluent foci of T2 hyperintensity are seen within the white matter of cerebral hemispheres, nonspecific, most likely related to chronic small vessel ischemia. Remote infarcts are seen in the bilateral corona radiata, bilateral basal ganglia and pons. Foci of susceptibility artifact are seen in the bilateral parietal regions, pons and right cerebellar hemisphere, most consistent with hemosiderin deposits. No acute hemorrhage, hydrocephalus, extra-axial collection or mass lesion. Vascular: Normal flow voids. Skull and upper cervical spine: Reversal of the cervical curvature. There is diffuse decreased T1 signal within the visualized upper cervical spine, may represent red marrow reconversion versus marrow replacement. Correlate clinically. Sinuses/Orbits: Mucosal thickening throughout the paranasal sinuses with fluid level within the left sphenoid sinus. The orbits are grossly unremarkable. Other: None. MRA HEAD FINDINGS Visualized upper cervical and intracranial internal carotid arteries have normal flow related enhancement without evidence of occlusion or stenosis. Status post stenting of the mid/distal M1/MCA segment. Susceptibility artifact precludes evaluation of state patency. However, normal flow related enhancement is seen in most of the M2 branches, with the exception of one middle division branch which has attenuated flow related enhancement (series 3, image 109).  The bilateral ACA and left MCA vascular tree have normal flow related enhancement. The intracranial segment of the bilateral vertebral arteries, the basilar artery and bilateral posterior cerebral arteries have normal course and caliber and flow related enhancement without evidence of stenosis or occlusion. IMPRESSION: 1. Scattered small foci of restricted diffusion throughout the right MCA territory with a cluster at the temporal occipital region, consistent with acute/subacute infarcts. 2. A focus of restricted diffusion is also seen in the  right cerebellar hemisphere. 3. Advanced chronic small vessel ischemia. Remote infarcts in the bilateral corona radiata, bilateral basal ganglia and pons. 4. Status post stenting of the mid/distal M1/MCA segment. Normal flow related enhancement is seen in most of the M2 branches, with the exception of one middle division branch which has attenuated flow related enhancement. 5. Diffuse decreased T1 signal within the visualized upper cervical spine, may represent red marrow reconversion versus marrow replacement. Correlate clinically. Electronically Signed   By: Pedro Earls M.D.   On: 10/31/2019 15:36   EEG adult  Result Date: 10/31/2019 Lora Havens, MD     10/31/2019  5:30 PM Patient Name: Safwan Tomei MRN: [id number] Epilepsy Attending: Lora Havens Referring Physician/Provider: Dr Rosalin Hawking Date: 10/31/2019 Duration: 23.40 mins Patient history: 74 y.o. male with history of L MCA stroke with resultant R sided weakness and HTN, presented with transient recurrent episodes of aphasia and automatisms of R arm with increased tone in all extremities. EEG to evaluate for seizure. Level of alertness: Awake AEDs during EEG study: Propofol Technical aspects: This EEG study was done with scalp electrodes positioned according to the 10-20 International system of electrode placement. Electrical activity was acquired at a sampling rate of 500Hz  and reviewed with a high frequency filter of 70Hz  and a low frequency filter of 1Hz . EEG data were recorded continuously and digitally stored. Description:  No clear posterior dominant rhythm was seen. EEG showed continuous generalized 5-8 Hz theta-alpha activity.  Hyperventilation and photic stimulation were not performed.   ABNORMALITY -Continuous slow, generalized IMPRESSION: This study is suggestive of mild diffuse encephalopathy, nonspecific etiology. No seizures or epileptiform discharges were seen throughout the recording. Priyanka Barbra Sarks     PHYSICAL EXAM  Temp:  [97.9 F (36.6 C)-100.3 F (37.9 C)] 97.9 F (36.6 C) (05/20 0800) Pulse Rate:  [57-110] 67 (05/20 0800) Resp:  [15-28] 19 (05/20 0800) BP: (92-160)/(58-111) 127/81 (05/20 0800) SpO2:  [90 %-100 %] 95 % (05/20 0800) Arterial Line BP: (93-170)/(45-87) 170/76 (05/20 0800)  General - Well nourished, well developed, not in acute distress.  Ophthalmologic - fundi not visualized due to noncooperation.  Cardiovascular - Regular rate and rhythm.  Neuro - awake, alert, eyes open, orientated to place, but not to time or age. Following simple commands. Eyes in mid position, no gaze palsy, blinking to visual threat bilaterally, visual field full, PERRL. Facial symmetric.  Tongue protrusion midline. Moving all extremities on command, BUE 4/5, BLE 3/5 proximal and distally. DTR 1+ and no babinski. Sensation symmetrical, coordination slow but grossly intact and gait not tested.   ASSESSMENT/PLAN Mr. Sumit Branham is a 74 y.o. male with history of  L MCA stroke with resultant R sided weakness and HTN, but has not seen a physician in 6-7 yrs and is not on any medications presenting to [facility] with transient recurrent episodes of aphasia and automatisms of R arm with increased tone in all extremities. CTA showed distal R M2 occlusion. Transferred t [facility] for possible  IR.    Stroke:  R MCA scattered and single punctate right cerebellar infarcts with right M1 occlusion s/p R M1 thrombectomy and stent, embolic pattern, source unclear  Code Stroke CT head No acute abnormality. Small vessel disease. ASPECTS 10.     CTA head distal R M1 occlusion w/ partial reconstitution of distal territory.   CTA neck Unremarkable   Cerebral angio R M1 occlusion treated w/ DES balloon inflatable stent w/ TICI3 flow  MRI scattered right MCA infarcts with one punctate right cerebellar infarct  MRA right M1 stent patent  2D Echo EF 60-65%   LE venous doppler no DVT    Consider loop recorder if above work up unrevealing  LDL 143  HgbA1c 7.4  UDS neg  Heparin 5000 units sq tid for VTE prophylaxis  No antithrombotic prior to admission, now on aspirin 81 mg daily and clopidogrel 75 mg daily.  Continue DAPT for 3 to 6 months due to stent placement.  Therapy recommendations:  CIR  Disposition:  pending   Possible seizure   Episode of automatisms and increased body tone  treated w/ ativan and Keppra load 1gm  EEG no seizure, mild diffuse encephalopathy  Continue Keppra 500 twice daily  Acute Respiratory Failure  Intubated for IR, left intubated d/t inability to protect airway  Extubated 5/19  Tolerating well  CCM signed off  Hypertensive emergency  BP on arrival 186/94  Home meds:  None (non-compliant), does have hx HTN . off Cleviprex  . Increase SBP goal to < 160  . On lisinopril 40 . Long-term BP goal normotensive  Hyperlipidemia  Home meds:  No statin  Now on lipitor 80   LDL 143, goal < 70  TG 316 -> 240  Continue statin at discharge  Possible Diabetes   HgbA1c 7.4, goal < 7.0  CBGs  SSI  PCP follow up  Dysphagia . Secondary to stroke . NPO but meds with pudding thick . MBS tomorrow . Speech on board . On IVF NS @ 50   Other Stroke Risk Factors  Advanced age  Former Cigarette smoker  Hx stroke/TIA  20 yrs ago per wife w/ resultant R hemiparesis    Other Active Problems  Hx abdominal surgery  Hospital day # 2   Patient continues to be critically ill for the last 24 hours, continues to have severe dysarthria and did not pass swallow. Still needed respiratory monitoring for within 24h of extubation, and I decreased his BP meds to avoid low BP. I discussed with Dr. Denese Killings. This patient is critically ill due to right M1 occlusion, right MCA stroke, status post thrombectomy and stenting, respiratory failure on ventilation, hypertensive emergency on IV BP meds, and seizure and at significant  risk of neurological worsening, death form recurrent stroke, hemorrhagic conversion, respiratory failure, hypertensive encephalopathy, status epilepticus, heart failure. This patient's care requires constant monitoring of vital signs, hemodynamics, respiratory and cardiac monitoring, review of multiple databases, neurological assessment, discussion with family, other specialists and medical decision making of high complexity. I spent 35 minutes of neurocritical care time in the care of this patient.   Marvel Plan, MD PhD Stroke Neurology 11/01/2019 10:07 AM     To contact Stroke Continuity provider, please refer to [email or web address]. After hours, contact General Neurology

## 2019-11-01 NOTE — Progress Notes (Signed)
Physical Therapy Treatment Patient Details Name: Jeffery Reilly MRN: [id number] DOB: [birth year] Today's Date: 11/01/2019    History of Present Illness 74 y.o. male past medical history of mild residual right hemiparesis from a left cerebral hemispheric stroke  Patient presented to [facility] for confusion/aphasia. Pt with aphasia, R gaze deviation and L weakness with possible neglect. CTA head/neck demonstrating R MCA occlusion. Pt underwent R MCA stenting on 5/18. Pt remains intubated after procedure.    PT Comments    Pt tolerates treatment well with improved activity tolerance and gait quality. Pt able to clear both feet with stepping attempts although still minimally, with improved coordination of RLE. Pt continues to require physical assistance to perform all functional mobility due to bilateral weakness. Pt also requires some cueing for attention to LUE during bed mobility. Pt will continue to benefit from acute PT POC to reduce falls risk and improve mobility quality. PT continues to recommend CIR at this time.   Follow Up Recommendations  CIR;Supervision/Assistance - 24 hour     Equipment Recommendations  Wheelchair (measurements PT);Wheelchair cushion (measurements PT)    Recommendations for Other Services       Precautions / Restrictions Precautions Precautions: Fall Restrictions Weight Bearing Restrictions: No    Mobility  Bed Mobility Overal bed mobility: Needs Assistance Bed Mobility: Supine to Sit     Supine to sit: Mod assist        Transfers Overall transfer level: Needs assistance Equipment used: Rolling walker (2 wheeled) Transfers: Sit to/from Stand Sit to Stand: Mod assist;From elevated surface            Ambulation/Gait Ambulation/Gait assistance: Mod assist Gait Distance (Feet): 8 Feet(additional trial of 3' turning from bed to recliner) Assistive device: Rolling walker (2 wheeled) Gait Pattern/deviations: Step-to pattern;Shuffle;Trunk  flexed Gait velocity: reduced Gait velocity interpretation: <1.31 ft/sec, indicative of household ambulator General Gait Details: pt with short shuffling steps, significant trunk flexion which is exacerbated by fatigue despite PT cues for upright posture. Pt is able to minimally clear both feet with less clearance of RLE   Stairs             Wheelchair Mobility    Modified Rankin (Stroke Patients Only) Modified Rankin (Stroke Patients Only) Pre-Morbid Rankin Score: Slight disability Modified Rankin: Moderately severe disability     Balance Overall balance assessment: Needs assistance Sitting-balance support: Single extremity supported;Feet supported Sitting balance-Leahy Scale: Fair Sitting balance - Comments: minG at edge of bed   Standing balance support: Bilateral upper extremity supported Standing balance-Leahy Scale: Poor Standing balance comment: minA to maintain static standing balance with BUE support of RW                            Cognition Arousal/Alertness: Awake/alert Behavior During Therapy: WFL for tasks assessed/performed Overall Cognitive Status: Impaired/Different from baseline Area of Impairment: Memory;Safety/judgement;Awareness;Problem solving                     Memory: Decreased short-term memory;Decreased recall of precautions   Safety/Judgement: Decreased awareness of safety;Decreased awareness of deficits Awareness: Intellectual Problem Solving: Slow processing;Requires verbal cues        Exercises      General Comments General comments (skin integrity, edema, etc.): pt on RA, VSS during session      Pertinent Vitals/Pain Pain Assessment: No/denies pain    Home Living  Prior Function            PT Goals (current goals can now be found in the care plan section) Acute Rehab PT Goals Patient Stated Goal: To return to prior level of function Progress towards PT goals: Progressing  toward goals    Frequency    Min 4X/week      PT Plan Current plan remains appropriate    Co-evaluation              AM-PAC PT "6 Clicks" Mobility   Outcome Measure  Help needed turning from your back to your side while in a flat bed without using bedrails?: A Lot Help needed moving from lying on your back to sitting on the side of a flat bed without using bedrails?: A Lot Help needed moving to and from a bed to a chair (including a wheelchair)?: A Lot Help needed standing up from a chair using your arms (e.g., wheelchair or bedside chair)?: A Lot Help needed to walk in hospital room?: A Lot Help needed climbing 3-5 steps with a railing? : Total 6 Click Score: 11    End of Session Equipment Utilized During Treatment: Gait belt Activity Tolerance: Patient tolerated treatment well Patient left: in chair;with call bell/phone within reach;with chair alarm set Nurse Communication: Mobility status PT Visit Diagnosis: Unsteadiness on feet (R26.81);Other abnormalities of gait and mobility (R26.89);Muscle weakness (generalized) (M62.81);Other symptoms and signs involving the nervous system (R29.898)     Time: 0350-0938 PT Time Calculation (min) (ACUTE ONLY): 33 min  Charges:  $Gait Training: 8-22 mins $Therapeutic Activity: 8-22 mins                     Zenaida Niece, PT, DPT Acute Rehabilitation Pager: [phone]    Zenaida Niece 11/01/2019, 9:13 AM

## 2019-11-01 NOTE — Progress Notes (Signed)
Referring Physician(s): Code Stroke- Milon Dikes  Supervising Physician: Gilmer Mor  Patient Status:  [facility] - In-pt  Chief Complaint:  Code Stroke R MCA CVA  Subjective:  Patient extubated yesterday afternoon.   Conversant.  Still with slowed, slurred speech but is moving all extremities.   Allergies: Patient has no known allergies.  Medications: Prior to Admission medications   Not on File     Vital Signs: BP 127/81   Pulse 67   Temp 97.9 F (36.6 C) (Oral)   Resp 19   SpO2 95%   Physical Exam Vitals and nursing note reviewed.    Alert, awake, and oriented x4. Speech and comprehension appears intact although slow with slurring of words.  He is intelligible and follows commands well. PERRL. EOMs intact. Slight right facial droop, likely at baseline.  Tongue with slight deviation to the right, likely at baseline. Bilateral upper extremity strength 4/5.  Hand grip strength 5/5 on left, 4/5 on left.  Right hand/digits deformity noted. Lower extremity strength 4/5 bilaterally.    Imaging: CT Angio Head W or Wo Contrast  Result Date: 10/30/2019 CLINICAL DATA:  Code stroke follow-up EXAM: CT ANGIOGRAPHY HEAD AND NECK TECHNIQUE: Multidetector CT imaging of the head and neck was performed using the standard protocol during bolus administration of intravenous contrast. Multiplanar CT image reconstructions and MIPs were obtained to evaluate the vascular anatomy. Carotid stenosis measurements (when applicable) are obtained utilizing NASCET criteria, using the distal internal carotid diameter as the denominator. CONTRAST:  54mL OMNIPAQUE IOHEXOL 350 MG/ML SOLN COMPARISON:  Earlier same day FINDINGS: CT HEAD FINDINGS Brain: There is no acute intracranial hemorrhage, mass effect, or edema. No new loss of gray-white differentiation. Chronic infarctions and chronic microvascular ischemic changes as described previously. Vascular: No new finding Skull: No new finding Sinuses: No new  finding Orbits: No new finding Review of the MIP images confirms the above findings CTA NECK FINDINGS Aortic arch: Great vessel origins are patent. There is direct origin of the left vertebral artery from the arch. Right carotid system: Patent. No measurable stenosis at the ICA origin. Retropharyngeal course of the ICA. Left carotid system: Patent. No measurable stenosis at the ICA origin. Retropharyngeal course of the ICA. Vertebral arteries: Patent and codominant. Skeleton: Multilevel degenerative changes of the cervical spine. Other neck: No mass or adenopathy. Upper chest: No apical lung mass. Review of the MIP images confirms the above findings CTA HEAD FINDINGS Anterior circulation: Intracranial internal carotid arteries are patent. There is occlusion of the distal right M1 MCA. Diminished flow within the more distal right MCA territory. Anterior and left middle cerebral arteries are patent. Posterior circulation: Intracranial vertebral arteries are patent. Basilar artery is patent with focal short segment moderate stenosis including the level of left AICA origin. Posterior cerebral arteries are patent. There is a patent right posterior communicating artery. Venous sinuses: Not well evaluated on this study. Review of the MIP images confirms the above findings IMPRESSION: Distal right M1 MCA occlusion. Partial reconstitution of more distal right MCA territory. No acute intracranial hemorrhage.  ASPECT score remains 10. No hemodynamically significant stenosis in the neck. These results were called by telephone at the time of interpretation on 10/30/2019 at 4:28 pm to provider [facility] , who verbally acknowledged these results. Electronically Signed   By: Guadlupe Spanish M.D.   On: 10/30/2019 16:32   CT Angio Neck W and/or Wo Contrast  Result Date: 10/30/2019 CLINICAL DATA:  Code stroke follow-up EXAM: CT ANGIOGRAPHY  HEAD AND NECK TECHNIQUE: Multidetector CT imaging of the head and neck was performed  using the standard protocol during bolus administration of intravenous contrast. Multiplanar CT image reconstructions and MIPs were obtained to evaluate the vascular anatomy. Carotid stenosis measurements (when applicable) are obtained utilizing NASCET criteria, using the distal internal carotid diameter as the denominator. CONTRAST:  75mL OMNIPAQUE IOHEXOL 350 MG/ML SOLN COMPARISON:  Earlier same day FINDINGS: CT HEAD FINDINGS Brain: There is no acute intracranial hemorrhage, mass effect, or edema. No new loss of gray-white differentiation. Chronic infarctions and chronic microvascular ischemic changes as described previously. Vascular: No new finding Skull: No new finding Sinuses: No new finding Orbits: No new finding Review of the MIP images confirms the above findings CTA NECK FINDINGS Aortic arch: Great vessel origins are patent. There is direct origin of the left vertebral artery from the arch. Right carotid system: Patent. No measurable stenosis at the ICA origin. Retropharyngeal course of the ICA. Left carotid system: Patent. No measurable stenosis at the ICA origin. Retropharyngeal course of the ICA. Vertebral arteries: Patent and codominant. Skeleton: Multilevel degenerative changes of the cervical spine. Other neck: No mass or adenopathy. Upper chest: No apical lung mass. Review of the MIP images confirms the above findings CTA HEAD FINDINGS Anterior circulation: Intracranial internal carotid arteries are patent. There is occlusion of the distal right M1 MCA. Diminished flow within the more distal right MCA territory. Anterior and left middle cerebral arteries are patent. Posterior circulation: Intracranial vertebral arteries are patent. Basilar artery is patent with focal short segment moderate stenosis including the level of left AICA origin. Posterior cerebral arteries are patent. There is a patent right posterior communicating artery. Venous sinuses: Not well evaluated on this study. Review of the MIP  images confirms the above findings IMPRESSION: Distal right M1 MCA occlusion. Partial reconstitution of more distal right MCA territory. No acute intracranial hemorrhage.  ASPECT score remains 10. No hemodynamically significant stenosis in the neck. These results were called by telephone at the time of interpretation on 10/30/2019 at 4:28 pm to provider [facility] , who verbally acknowledged these results. Electronically Signed   By: Guadlupe Spanish M.D.   On: 10/30/2019 16:32   MR ANGIO HEAD WO CONTRAST  Result Date: 10/31/2019 CLINICAL DATA:  Stroke follow-up. EXAM: MRI HEAD WITHOUT CONTRAST MRA HEAD WITHOUT CONTRAST TECHNIQUE: Multiplanar, multiecho pulse sequences of the brain and surrounding structures were obtained without intravenous contrast. Angiographic images of the head were obtained using MRA technique without contrast. COMPARISON:  Head CT Oct 30, 2019 FINDINGS: The study is degraded by motion. MRI HEAD FINDINGS Brain: Scattered foci of restricted diffusion is seen throughout the right MCA territory with a cluster at the temporal occipital region, consistent with acute/subacute infarcts. A focus of restricted diffusion is also seen right cerebellar hemisphere. Scattered and confluent foci of T2 hyperintensity are seen within the white matter of cerebral hemispheres, nonspecific, most likely related to chronic small vessel ischemia. Remote infarcts are seen in the bilateral corona radiata, bilateral basal ganglia and pons. Foci of susceptibility artifact are seen in the bilateral parietal regions, pons and right cerebellar hemisphere, most consistent with hemosiderin deposits. No acute hemorrhage, hydrocephalus, extra-axial collection or mass lesion. Vascular: Normal flow voids. Skull and upper cervical spine: Reversal of the cervical curvature. There is diffuse decreased T1 signal within the visualized upper cervical spine, may represent red marrow reconversion versus marrow replacement.  Correlate clinically. Sinuses/Orbits: Mucosal thickening throughout the paranasal sinuses with fluid level within the  left sphenoid sinus. The orbits are grossly unremarkable. Other: None. MRA HEAD FINDINGS Visualized upper cervical and intracranial internal carotid arteries have normal flow related enhancement without evidence of occlusion or stenosis. Status post stenting of the mid/distal M1/MCA segment. Susceptibility artifact precludes evaluation of state patency. However, normal flow related enhancement is seen in most of the M2 branches, with the exception of one middle division branch which has attenuated flow related enhancement (series 3, image 109). The bilateral ACA and left MCA vascular tree have normal flow related enhancement. The intracranial segment of the bilateral vertebral arteries, the basilar artery and bilateral posterior cerebral arteries have normal course and caliber and flow related enhancement without evidence of stenosis or occlusion. IMPRESSION: 1. Scattered small foci of restricted diffusion throughout the right MCA territory with a cluster at the temporal occipital region, consistent with acute/subacute infarcts. 2. A focus of restricted diffusion is also seen in the right cerebellar hemisphere. 3. Advanced chronic small vessel ischemia. Remote infarcts in the bilateral corona radiata, bilateral basal ganglia and pons. 4. Status post stenting of the mid/distal M1/MCA segment. Normal flow related enhancement is seen in most of the M2 branches, with the exception of one middle division branch which has attenuated flow related enhancement. 5. Diffuse decreased T1 signal within the visualized upper cervical spine, may represent red marrow reconversion versus marrow replacement. Correlate clinically. Electronically Signed   By: Baldemar LenisKatyucia  De Macedo Rodrigues M.D.   On: 10/31/2019 15:36   MR BRAIN WO CONTRAST  Result Date: 10/31/2019 CLINICAL DATA:  Stroke follow-up. EXAM: MRI HEAD  WITHOUT CONTRAST MRA HEAD WITHOUT CONTRAST TECHNIQUE: Multiplanar, multiecho pulse sequences of the brain and surrounding structures were obtained without intravenous contrast. Angiographic images of the head were obtained using MRA technique without contrast. COMPARISON:  Head CT Oct 30, 2019 FINDINGS: The study is degraded by motion. MRI HEAD FINDINGS Brain: Scattered foci of restricted diffusion is seen throughout the right MCA territory with a cluster at the temporal occipital region, consistent with acute/subacute infarcts. A focus of restricted diffusion is also seen right cerebellar hemisphere. Scattered and confluent foci of T2 hyperintensity are seen within the white matter of cerebral hemispheres, nonspecific, most likely related to chronic small vessel ischemia. Remote infarcts are seen in the bilateral corona radiata, bilateral basal ganglia and pons. Foci of susceptibility artifact are seen in the bilateral parietal regions, pons and right cerebellar hemisphere, most consistent with hemosiderin deposits. No acute hemorrhage, hydrocephalus, extra-axial collection or mass lesion. Vascular: Normal flow voids. Skull and upper cervical spine: Reversal of the cervical curvature. There is diffuse decreased T1 signal within the visualized upper cervical spine, may represent red marrow reconversion versus marrow replacement. Correlate clinically. Sinuses/Orbits: Mucosal thickening throughout the paranasal sinuses with fluid level within the left sphenoid sinus. The orbits are grossly unremarkable. Other: None. MRA HEAD FINDINGS Visualized upper cervical and intracranial internal carotid arteries have normal flow related enhancement without evidence of occlusion or stenosis. Status post stenting of the mid/distal M1/MCA segment. Susceptibility artifact precludes evaluation of state patency. However, normal flow related enhancement is seen in most of the M2 branches, with the exception of one middle division  branch which has attenuated flow related enhancement (series 3, image 109). The bilateral ACA and left MCA vascular tree have normal flow related enhancement. The intracranial segment of the bilateral vertebral arteries, the basilar artery and bilateral posterior cerebral arteries have normal course and caliber and flow related enhancement without evidence of stenosis or occlusion. IMPRESSION: 1. Scattered  small foci of restricted diffusion throughout the right MCA territory with a cluster at the temporal occipital region, consistent with acute/subacute infarcts. 2. A focus of restricted diffusion is also seen in the right cerebellar hemisphere. 3. Advanced chronic small vessel ischemia. Remote infarcts in the bilateral corona radiata, bilateral basal ganglia and pons. 4. Status post stenting of the mid/distal M1/MCA segment. Normal flow related enhancement is seen in most of the M2 branches, with the exception of one middle division branch which has attenuated flow related enhancement. 5. Diffuse decreased T1 signal within the visualized upper cervical spine, may represent red marrow reconversion versus marrow replacement. Correlate clinically. Electronically Signed   By: Baldemar Lenis M.D.   On: 10/31/2019 15:36   IR CT Head Ltd  Result Date: 10/30/2019 INDICATION: 73 year old male with acute stroke, right MCA M1 occlusion EXAM: ULTRASOUND GUIDED ACCESS RIGHT COMMON FEMORAL ARTERY CERVICAL AND CEREBRAL ANGIOGRAM MECHANICAL THROMBECTOMY RIGHT MCA/M1 RESCUE STENT OF RIGHT MCA INTRACRANIAL ATHEROSCLEROSIS ANGIO-SEAL FOR CLOSURE COMPARISON:  CT imaging same day MEDICATIONS: 3 mg intra arterial integral and, 650 mg aspirin OG, 300 mg Plavix OG ANESTHESIA/SEDATION: The anesthesia team was present to provide general endotracheal tube anesthesia and for patient monitoring during the procedure. Intubation was performed in negative pressure [place] in neuro IR holding. Interventional neuro radiology nursing  staff was also present. CONTRAST:  170 cc Omnipaque 300 FLUOROSCOPY TIME:  Fluoroscopy Time: 40 minutes 42 seconds (3,316 mGy). COMPLICATIONS: None TECHNIQUE: Informed written consent was obtained from the patient's family after a thorough discussion of the procedural risks, benefits and alternatives. Specific risks discussed include: Bleeding, infection, contrast reaction, kidney injury/failure, need for further procedure/surgery, arterial injury or dissection, embolization to new territory, intracranial hemorrhage (10-15% risk), neurologic deterioration, cardiopulmonary collapse, death. All questions were addressed. Maximal Sterile Barrier Technique was utilized including during the procedure including caps, mask, sterile gowns, sterile gloves, sterile drape, hand hygiene and skin antiseptic. A timeout was performed prior to the initiation of the procedure. The anesthesia team was present to provide general endotracheal tube anesthesia and for patient monitoring during the procedure. Interventional neuro radiology nursing staff was also present. FINDINGS: Initial Findings: Right internal carotid artery: Tortuosity of the cervical segment. Vertical and petrous segment patent with normal course caliber contour. Cavernous segment patent. Clinoid segment patent. Antegrade flow of the ophthalmic artery. Ophthalmic segment patent. Terminus patent. Right MCA: Proximal M1 segment is patent. There is a tapered cuff-off of the mid segment of the M1, in the region the mid orbit. Proximal temporal branch remains patent. TICI 0 flow through the M1 segment. There late filling with collaterals via the right anterior cerebral artery and the right posterior cerebral artery via leptomeningeal collateral vessels. Patent anterior communicating artery with the temporal region supplied by the PCA. Right ACA: A 1 segment patent. A 2 segment perfuses the right territory. Patent anterior communicating artery with significant cross-filling  of the left anterior cerebral artery. Completion Findings: During the mechanical thrombectomy, the initial 2 passes resulted in TICI 2B flow, with partial filling of greater than 50% of the right-sided hemispheric MCA branches. The cortical branches after the first 2 passes are filling both from leptomeningeal collaterals as well as through the attenuated M1 segment. The final pass with the EMBO trapped device uncovered atherosclerotic plaque at the distal M1 segment. This atherosclerotic plaque was then treated with drug-eluting balloon mounted stent. Right MCA: TICI 3 flow was restored through the right hemisphere. The stent was successful in restoring flow through  the disease segment of the M1 Right common carotid artery:  Normal course caliber and contour. Right external carotid artery: Patent with antegrade flow. Flat panel CT demonstrates minimal staining within the supra ganglionic right MCA territory with no subarachnoid hemorrhage or large parenchymal hemorrhage. PROCEDURE: Patient was brought to the [facility] suite, and identity was confirmed. Patient was prepped and draped in the usual sterile fashion. Ultrasound survey of the right inguinal region was performed with images stored and sent to PACs. 11 blade scalpel was used to make a small incision. Blunt dissection was performed with US guidance. A micropuncture needle was used access the right common femoral artery under ultrasound. With excellent arterial blood flow returned, an .018 micro wire was passed through the needle, observed to enter the abdominal aorta under fluoroscopy. The needle was removed, and a micropuncture sheath was placed over the wire. The inner dilator and wire were removed, and an 035 wire was advanced under fluoroscopy into the abdominal aorta. The sheath was removed and a 25cm 81F straight vascular sheath was placed. The dilator was removed and the sheath was flushed. Limited angiogram was performed. Sheath was attached to pressurized  and heparinized saline bag for constant forward flow. A coaxial system was then advanced over the 035 wire. This included a 110 cm Zoom 088 catheter with coaxial 125cm Berenstein diagnostic catheter. This was advanced to the proximal descending thoracic aorta. Wire was then removed. Double flush of the catheter was performed. Catheter was then used to select the innominate artery and the common carotid artery. The catheter combination was then navigated into the cervical ICA segment. The Berenstein catheter and the Glidewire were removed. Angiogram was performed. Road map function was used once the occluded vessel was identified. Copious back flush was performed and the 088 catheter was attached to heparinized and pressurized saline bag for forward flow. A second coaxial system was then advanced through the balloon catheter, which included the selected intermediate catheter, microcatheter, and microwire. In this scenario, the set up included a 137 cm 071 Zoom catheter, a Trevo Provue18 microcatheter, and 014 synchro soft wire. This system was advanced through the guide catheter under the road-map function, with adequate back-flush at the rotating hemostatic valve at that back end of the balloon guide. Microcatheter and the intermediate catheter system were advanced through the terminal ICA and MCA to the level of the occlusion. The micro wire in the microcatheter were then removed. These 071 Zoom catheter was then advanced to the face of the occlusion for the first pass attempt, adapt technique. Catheter was withdrawn while at the same time the 088 distal access catheter was advanced through the carotid siphon to the terminus. Angiogram confirmed persistent occlusion. The 071 catheter was then readvanced through the distal access catheter with a coaxial microwire and microcatheter. Microcatheter and the microwire were then gently navigated through the occlusion into angular branch of the MCA. Microcatheter was  advanced through the occlusion. Wire was removed. Blood was then aspirated through the hub of the microcatheter, and a gentle contrast injection was performed confirming intraluminal position. A rotating hemostatic valve was then attached to the back end of the microcatheter, and a pressurized and heparinized saline bag was attached to the catheter. 4 x 40 solitaire device was then selected. Back flush was achieved at the rotating hemostatic valve, and then the device was gently advanced through the microcatheter to the distal end. The retriever was then unsheathed by withdrawing the microcatheter under fluoroscopy. Once the retriever was  completely unsheathed, the microcatheter was carefully stripped from the delivery device. Control angiogram was performed from the intermediate catheter. A 3 minute time interval was observed. Solitaire stent was then withdrawn to the 071 catheter under fluoroscopy. Once the retriever was "corked" within the tip of the intermediate catheter, both were removed from the system. Free aspiration was confirmed at the hub of the 088 distal access catheter, with free blood return confirmed. Control angiogram was performed. Partial occlusion remained with TICI 2b flow at this point. We then advanced the zoom catheter with the microwire and microcatheter combination for a third pass attempt, using the adapt technique. 071 catheter was gently advanced to the face of the clot, and aspiration was performed. Once the catheter was withdrawn into the base 088 distal guide catheter, aspiration was performed at the hub of the 088 catheter. Angiogram was performed. Partial occlusion remained with TICI 2b flow at this point. We then proceeded with a fourth pass. The 071 catheter was then readvanced through the distal access catheter with a coaxial microwire and microcatheter. Microcatheter and the microwire were then gently navigated to the face of the occlusion. The synchro soft wire would not  advance through the occlusion. A headliner J wire 012 was then used through the microcatheter, successful in passing through the occlusion into the angular branch. Microcatheter was advanced through the occlusion. Wire was removed. Blood was then aspirated through the hub of the microcatheter, and a gentle contrast injection was performed confirming intraluminal position. A rotating hemostatic valve was then attached to the back end of the microcatheter, and a pressurized and heparinized saline bag was attached to the catheter. 5 mm by 37 mm EMBO trapped was then selected. Back flush was achieved at the rotating hemostatic valve, and then the device was gently advanced through the microcatheter to the distal end. The retriever was then unsheathed by withdrawing the microcatheter under fluoroscopy. Once the retriever was completely unsheathed, the microcatheter was carefully stripped from the delivery device. Control angiogram was performed from the intermediate catheter. This control angiogram confirmed flow through the EMBO trap, with a lesion at the site of the occlusion, representing native intracranial atherosclerosis. While the stent tree vert remained open for proximally 5 minutes, we had the anesthesia team pass and OG tube. We administered 650 mg of aspirin. Estimation of the diameter length of the lesion were performed. We selected a drug-eluting balloon mounted stent, 2.25 mm x 15 mm. After approximately 5 minutes, the stent tree vert was removed. Angiogram was performed confirming TICI TB flow, relatively unchanged. We then navigated the microcatheter and the microwire/synchro wire back through the 088 distal access catheter. This combination was successful in crossing the stenosis/lesion in the distal M1. The catheter micro wire were placed into the angular branch and the wire was removed. Gentle injection confirmed location. A transcend wire was then placed through the microcatheter and the  microcatheter was removed. A baseline angiogram was performed. We then deployed a balloon mounted drug-eluting stent. We selected a Onyx 2.16mm x 15mm stent. This was deployed with 7 atmosphere inflation. Balloon was deflated and withdrawn. Repeat angiogram was performed. TICI 3 was achieved. Wires and catheters were removed. The distal access catheter was withdrawn to the cervical ICA and repeat angiogram was performed of the cervical segment. The 088 catheter was then withdrawn. The skin at the puncture site was then cleaned with Chlorhexidine. The 8 French sheath was removed and an 21F angioseal was deployed. Flat panel CT was performed.  Patient tolerated the procedure well and remained hemodynamically stable throughout. No complications were encountered and no significant blood loss encountered. IMPRESSION: Status post ultrasound guided access right common femoral artery for cervical and cerebral angiogram, mechanical thrombectomy of right M1 occlusion, and rescue stenting of native intracranial atherosclerotic permissive lesion with 2.25 mm x 15 mm drug-eluting stent, restoring TICI 3 flow. Angio-Seal for hemostasis. Signed, Yvone Neu. Reyne Dumas, RPVI [facility] Radiology PLAN: The patient will remain intubated. ICU status Target systolic blood pressure of 120-140 Right hip straight time 6 hours Frequent neurovascular checks Repeat neurologic imaging with CT and/MRI at the discretion of neurology team Dual anti-platelet therapy. Electronically Signed   By: Gilmer Mor D.O.   On: 10/30/2019 20:58   IR US Guide Vasc Access Right  Result Date: 10/30/2019 INDICATION: 74 year old male with acute stroke, right MCA M1 occlusion EXAM: ULTRASOUND GUIDED ACCESS RIGHT COMMON FEMORAL ARTERY CERVICAL AND CEREBRAL ANGIOGRAM MECHANICAL THROMBECTOMY RIGHT MCA/M1 RESCUE STENT OF RIGHT MCA INTRACRANIAL ATHEROSCLEROSIS ANGIO-SEAL FOR CLOSURE COMPARISON:  CT imaging same day  MEDICATIONS: 3 mg intra arterial integral and, 650 mg aspirin OG, 300 mg Plavix OG ANESTHESIA/SEDATION: The anesthesia team was present to provide general endotracheal tube anesthesia and for patient monitoring during the procedure. Intubation was performed in negative pressure [place] in neuro IR holding. Interventional neuro radiology nursing staff was also present. CONTRAST:  170 cc Omnipaque 300 FLUOROSCOPY TIME:  Fluoroscopy Time: 40 minutes 42 seconds (3,316 mGy). COMPLICATIONS: None TECHNIQUE: Informed written consent was obtained from the patient's family after a thorough discussion of the procedural risks, benefits and alternatives. Specific risks discussed include: Bleeding, infection, contrast reaction, kidney injury/failure, need for further procedure/surgery, arterial injury or dissection, embolization to new territory, intracranial hemorrhage (10-15% risk), neurologic deterioration, cardiopulmonary collapse, death. All questions were addressed. Maximal Sterile Barrier Technique was utilized including during the procedure including caps, mask, sterile gowns, sterile gloves, sterile drape, hand hygiene and skin antiseptic. A timeout was performed prior to the initiation of the procedure. The anesthesia team was present to provide general endotracheal tube anesthesia and for patient monitoring during the procedure. Interventional neuro radiology nursing staff was also present. FINDINGS: Initial Findings: Right internal carotid artery: Tortuosity of the cervical segment. Vertical and petrous segment patent with normal course caliber contour. Cavernous segment patent. Clinoid segment patent. Antegrade flow of the ophthalmic artery. Ophthalmic segment patent. Terminus patent. Right MCA: Proximal M1 segment is patent. There is a tapered cuff-off of the mid segment of the M1, in the region the mid orbit. Proximal temporal branch remains patent. TICI 0 flow through the M1 segment. There late filling with  collaterals via the right anterior cerebral artery and the right posterior cerebral artery via leptomeningeal collateral vessels. Patent anterior communicating artery with the temporal region supplied by the PCA. Right ACA: A 1 segment patent. A 2 segment perfuses the right territory. Patent anterior communicating artery with significant cross-filling of the left anterior cerebral artery. Completion Findings: During the mechanical thrombectomy, the initial 2 passes resulted in TICI 2B flow, with partial filling of greater than 50% of the right-sided hemispheric MCA branches. The cortical branches after the first 2 passes are filling both from leptomeningeal collaterals as well as through the attenuated M1 segment. The final pass with the EMBO trapped device uncovered atherosclerotic plaque at the distal M1 segment. This atherosclerotic plaque was then treated with drug-eluting balloon mounted stent. Right MCA: TICI 3 flow was restored through the right hemisphere. The stent  was successful in restoring flow through the disease segment of the M1 Right common carotid artery:  Normal course caliber and contour. Right external carotid artery: Patent with antegrade flow. Flat panel CT demonstrates minimal staining within the supra ganglionic right MCA territory with no subarachnoid hemorrhage or large parenchymal hemorrhage. PROCEDURE: Patient was brought to the [facility] suite, and identity was confirmed. Patient was prepped and draped in the usual sterile fashion. Ultrasound survey of the right inguinal region was performed with images stored and sent to PACs. 11 blade scalpel was used to make a small incision. Blunt dissection was performed with US guidance. A micropuncture needle was used access the right common femoral artery under ultrasound. With excellent arterial blood flow returned, an .018 micro wire was passed through the needle, observed to enter the abdominal aorta under fluoroscopy. The needle was removed, and a  micropuncture sheath was placed over the wire. The inner dilator and wire were removed, and an 035 wire was advanced under fluoroscopy into the abdominal aorta. The sheath was removed and a 25cm 68F straight vascular sheath was placed. The dilator was removed and the sheath was flushed. Limited angiogram was performed. Sheath was attached to pressurized and heparinized saline bag for constant forward flow. A coaxial system was then advanced over the 035 wire. This included a 110 cm Zoom 088 catheter with coaxial 125cm Berenstein diagnostic catheter. This was advanced to the proximal descending thoracic aorta. Wire was then removed. Double flush of the catheter was performed. Catheter was then used to select the innominate artery and the common carotid artery. The catheter combination was then navigated into the cervical ICA segment. The Berenstein catheter and the Glidewire were removed. Angiogram was performed. Road map function was used once the occluded vessel was identified. Copious back flush was performed and the 088 catheter was attached to heparinized and pressurized saline bag for forward flow. A second coaxial system was then advanced through the balloon catheter, which included the selected intermediate catheter, microcatheter, and microwire. In this scenario, the set up included a 137 cm 071 Zoom catheter, a Trevo Provue18 microcatheter, and 014 synchro soft wire. This system was advanced through the guide catheter under the road-map function, with adequate back-flush at the rotating hemostatic valve at that back end of the balloon guide. Microcatheter and the intermediate catheter system were advanced through the terminal ICA and MCA to the level of the occlusion. The micro wire in the microcatheter were then removed. These 071 Zoom catheter was then advanced to the face of the occlusion for the first pass attempt, adapt technique. Catheter was withdrawn while at the same time the 088 distal access  catheter was advanced through the carotid siphon to the terminus. Angiogram confirmed persistent occlusion. The 071 catheter was then readvanced through the distal access catheter with a coaxial microwire and microcatheter. Microcatheter and the microwire were then gently navigated through the occlusion into angular branch of the MCA. Microcatheter was advanced through the occlusion. Wire was removed. Blood was then aspirated through the hub of the microcatheter, and a gentle contrast injection was performed confirming intraluminal position. A rotating hemostatic valve was then attached to the back end of the microcatheter, and a pressurized and heparinized saline bag was attached to the catheter. 4 x 40 solitaire device was then selected. Back flush was achieved at the rotating hemostatic valve, and then the device was gently advanced through the microcatheter to the distal end. The retriever was then unsheathed by withdrawing the microcatheter  under fluoroscopy. Once the retriever was completely unsheathed, the microcatheter was carefully stripped from the delivery device. Control angiogram was performed from the intermediate catheter. A 3 minute time interval was observed. Solitaire stent was then withdrawn to the 071 catheter under fluoroscopy. Once the retriever was "corked" within the tip of the intermediate catheter, both were removed from the system. Free aspiration was confirmed at the hub of the 088 distal access catheter, with free blood return confirmed. Control angiogram was performed. Partial occlusion remained with TICI 2b flow at this point. We then advanced the zoom catheter with the microwire and microcatheter combination for a third pass attempt, using the adapt technique. 071 catheter was gently advanced to the face of the clot, and aspiration was performed. Once the catheter was withdrawn into the base 088 distal guide catheter, aspiration was performed at the hub of the 088 catheter. Angiogram  was performed. Partial occlusion remained with TICI 2b flow at this point. We then proceeded with a fourth pass. The 071 catheter was then readvanced through the distal access catheter with a coaxial microwire and microcatheter. Microcatheter and the microwire were then gently navigated to the face of the occlusion. The synchro soft wire would not advance through the occlusion. A headliner J wire 012 was then used through the microcatheter, successful in passing through the occlusion into the angular branch. Microcatheter was advanced through the occlusion. Wire was removed. Blood was then aspirated through the hub of the microcatheter, and a gentle contrast injection was performed confirming intraluminal position. A rotating hemostatic valve was then attached to the back end of the microcatheter, and a pressurized and heparinized saline bag was attached to the catheter. 5 mm by 37 mm EMBO trapped was then selected. Back flush was achieved at the rotating hemostatic valve, and then the device was gently advanced through the microcatheter to the distal end. The retriever was then unsheathed by withdrawing the microcatheter under fluoroscopy. Once the retriever was completely unsheathed, the microcatheter was carefully stripped from the delivery device. Control angiogram was performed from the intermediate catheter. This control angiogram confirmed flow through the EMBO trap, with a lesion at the site of the occlusion, representing native intracranial atherosclerosis. While the stent tree vert remained open for proximally 5 minutes, we had the anesthesia team pass and OG tube. We administered 650 mg of aspirin. Estimation of the diameter length of the lesion were performed. We selected a drug-eluting balloon mounted stent, 2.25 mm x 15 mm. After approximately 5 minutes, the stent tree vert was removed. Angiogram was performed confirming TICI TB flow, relatively unchanged. We then navigated the microcatheter and the  microwire/synchro wire back through the 088 distal access catheter. This combination was successful in crossing the stenosis/lesion in the distal M1. The catheter micro wire were placed into the angular branch and the wire was removed. Gentle injection confirmed location. A transcend wire was then placed through the microcatheter and the microcatheter was removed. A baseline angiogram was performed. We then deployed a balloon mounted drug-eluting stent. We selected a Onyx 2.50mm x 15mm stent. This was deployed with 7 atmosphere inflation. Balloon was deflated and withdrawn. Repeat angiogram was performed. TICI 3 was achieved. Wires and catheters were removed. The distal access catheter was withdrawn to the cervical ICA and repeat angiogram was performed of the cervical segment. The 088 catheter was then withdrawn. The skin at the puncture site was then cleaned with Chlorhexidine. The 8 French sheath was removed and an 11F angioseal was  deployed. Flat panel CT was performed. Patient tolerated the procedure well and remained hemodynamically stable throughout. No complications were encountered and no significant blood loss encountered. IMPRESSION: Status post ultrasound guided access right common femoral artery for cervical and cerebral angiogram, mechanical thrombectomy of right M1 occlusion, and rescue stenting of native intracranial atherosclerotic permissive lesion with 2.25 mm x 15 mm drug-eluting stent, restoring TICI 3 flow. Angio-Seal for hemostasis. Signed, Yvone Neu. Reyne Dumas, RPVI [facility] Interventional Radiology Specialists [facility] Radiology PLAN: The patient will remain intubated. ICU status Target systolic blood pressure of 120-140 Right hip straight time 6 hours Frequent neurovascular checks Repeat neurologic imaging with CT and/MRI at the discretion of neurology team Dual anti-platelet therapy. Electronically Signed   By: Gilmer Mor D.O.   On: 10/30/2019 20:58   DG CHEST PORT 1  VIEW  Result Date: 10/31/2019 CLINICAL DATA:  CVA. EXAM: PORTABLE CHEST 1 VIEW COMPARISON:  None. FINDINGS: The endotracheal tube is 5.5 cm above the carina. The NG tube is coursing down the esophagus and into the stomach. The cardiac silhouette, mediastinal and hilar contours are within normal limits given the AP projection and portable technique. No acute pulmonary findings. No pleural effusions or pneumothorax. The bony thorax is intact. IMPRESSION: No acute cardiopulmonary findings. Electronically Signed   By: Rudie Meyer M.D.   On: 10/31/2019 06:35   EEG adult  Result Date: 10/31/2019 Charlsie Quest, MD     10/31/2019  5:30 PM Patient Name: Panfilo Ketchum MRN: [id number] Epilepsy Attending: Charlsie Quest Referring Physician/Provider: Dr Marvel Plan Date: 10/31/2019 Duration: 23.40 mins Patient history: 74 y.o. male with history of  L MCA stroke with resultant R sided weakness and HTN, presented with transient recurrent episodes of aphasia and automatisms of R arm with increased tone in all extremities. EEG to evaluate for seizure. Level of alertness: Awake AEDs during EEG study: Propofol Technical aspects: This EEG study was done with scalp electrodes positioned according to the 10-20 International system of electrode placement. Electrical activity was acquired at a sampling rate of 500Hz  and reviewed with a high frequency filter of 70Hz  and a low frequency filter of 1Hz . EEG data were recorded continuously and digitally stored. Description:  No clear posterior dominant rhythm was seen. EEG showed continuous generalized 5-8 Hz theta-alpha activity.  Hyperventilation and photic stimulation were not performed.   ABNORMALITY -Continuous slow, generalized IMPRESSION: This study is suggestive of mild diffuse encephalopathy, nonspecific etiology. No seizures or epileptiform discharges were seen throughout the recording. Priyanka O Yadav   IR PERCUTANEOUS ART THROMBECTOMY/INFUSION INTRACRANIAL INC DIAG  ANGIO  Result Date: 10/30/2019 INDICATION: 74 year old male with acute stroke, right MCA M1 occlusion EXAM: ULTRASOUND GUIDED ACCESS RIGHT COMMON FEMORAL ARTERY CERVICAL AND CEREBRAL ANGIOGRAM MECHANICAL THROMBECTOMY RIGHT MCA/M1 RESCUE STENT OF RIGHT MCA INTRACRANIAL ATHEROSCLEROSIS ANGIO-SEAL FOR CLOSURE COMPARISON:  CT imaging same day MEDICATIONS: 3 mg intra arterial integral and, 650 mg aspirin OG, 300 mg Plavix OG ANESTHESIA/SEDATION: The anesthesia team was present to provide general endotracheal tube anesthesia and for patient monitoring during the procedure. Intubation was performed in negative pressure [place] in neuro IR holding. Interventional neuro radiology nursing staff was also present. CONTRAST:  170 cc Omnipaque 300 FLUOROSCOPY TIME:  Fluoroscopy Time: 40 minutes 42 seconds (3,316 mGy). COMPLICATIONS: None TECHNIQUE: Informed written consent was obtained from the patient's family after a thorough discussion of the procedural risks, benefits and alternatives. Specific risks discussed include: Bleeding, infection, contrast reaction, kidney injury/failure, need for further procedure/surgery, arterial injury  or dissection, embolization to new territory, intracranial hemorrhage (10-15% risk), neurologic deterioration, cardiopulmonary collapse, death. All questions were addressed. Maximal Sterile Barrier Technique was utilized including during the procedure including caps, mask, sterile gowns, sterile gloves, sterile drape, hand hygiene and skin antiseptic. A timeout was performed prior to the initiation of the procedure. The anesthesia team was present to provide general endotracheal tube anesthesia and for patient monitoring during the procedure. Interventional neuro radiology nursing staff was also present. FINDINGS: Initial Findings: Right internal carotid artery: Tortuosity of the cervical segment. Vertical and petrous segment patent with normal course caliber contour. Cavernous segment patent.  Clinoid segment patent. Antegrade flow of the ophthalmic artery. Ophthalmic segment patent. Terminus patent. Right MCA: Proximal M1 segment is patent. There is a tapered cuff-off of the mid segment of the M1, in the region the mid orbit. Proximal temporal branch remains patent. TICI 0 flow through the M1 segment. There late filling with collaterals via the right anterior cerebral artery and the right posterior cerebral artery via leptomeningeal collateral vessels. Patent anterior communicating artery with the temporal region supplied by the PCA. Right ACA: A 1 segment patent. A 2 segment perfuses the right territory. Patent anterior communicating artery with significant cross-filling of the left anterior cerebral artery. Completion Findings: During the mechanical thrombectomy, the initial 2 passes resulted in TICI 2B flow, with partial filling of greater than 50% of the right-sided hemispheric MCA branches. The cortical branches after the first 2 passes are filling both from leptomeningeal collaterals as well as through the attenuated M1 segment. The final pass with the EMBO trapped device uncovered atherosclerotic plaque at the distal M1 segment. This atherosclerotic plaque was then treated with drug-eluting balloon mounted stent. Right MCA: TICI 3 flow was restored through the right hemisphere. The stent was successful in restoring flow through the disease segment of the M1 Right common carotid artery:  Normal course caliber and contour. Right external carotid artery: Patent with antegrade flow. Flat panel CT demonstrates minimal staining within the supra ganglionic right MCA territory with no subarachnoid hemorrhage or large parenchymal hemorrhage. PROCEDURE: Patient was brought to the [facility] suite, and identity was confirmed. Patient was prepped and draped in the usual sterile fashion. Ultrasound survey of the right inguinal region was performed with images stored and sent to PACs. 11 blade scalpel was used to make  a small incision. Blunt dissection was performed with US guidance. A micropuncture needle was used access the right common femoral artery under ultrasound. With excellent arterial blood flow returned, an .018 micro wire was passed through the needle, observed to enter the abdominal aorta under fluoroscopy. The needle was removed, and a micropuncture sheath was placed over the wire. The inner dilator and wire were removed, and an 035 wire was advanced under fluoroscopy into the abdominal aorta. The sheath was removed and a 25cm 34F straight vascular sheath was placed. The dilator was removed and the sheath was flushed. Limited angiogram was performed. Sheath was attached to pressurized and heparinized saline bag for constant forward flow. A coaxial system was then advanced over the 035 wire. This included a 110 cm Zoom 088 catheter with coaxial 125cm Berenstein diagnostic catheter. This was advanced to the proximal descending thoracic aorta. Wire was then removed. Double flush of the catheter was performed. Catheter was then used to select the innominate artery and the common carotid artery. The catheter combination was then navigated into the cervical ICA segment. The Berenstein catheter and the Glidewire were removed. Angiogram was performed.  Road map function was used once the occluded vessel was identified. Copious back flush was performed and the 088 catheter was attached to heparinized and pressurized saline bag for forward flow. A second coaxial system was then advanced through the balloon catheter, which included the selected intermediate catheter, microcatheter, and microwire. In this scenario, the set up included a 137 cm 071 Zoom catheter, a Trevo Provue18 microcatheter, and 014 synchro soft wire. This system was advanced through the guide catheter under the road-map function, with adequate back-flush at the rotating hemostatic valve at that back end of the balloon guide. Microcatheter and the intermediate  catheter system were advanced through the terminal ICA and MCA to the level of the occlusion. The micro wire in the microcatheter were then removed. These 071 Zoom catheter was then advanced to the face of the occlusion for the first pass attempt, adapt technique. Catheter was withdrawn while at the same time the 088 distal access catheter was advanced through the carotid siphon to the terminus. Angiogram confirmed persistent occlusion. The 071 catheter was then readvanced through the distal access catheter with a coaxial microwire and microcatheter. Microcatheter and the microwire were then gently navigated through the occlusion into angular branch of the MCA. Microcatheter was advanced through the occlusion. Wire was removed. Blood was then aspirated through the hub of the microcatheter, and a gentle contrast injection was performed confirming intraluminal position. A rotating hemostatic valve was then attached to the back end of the microcatheter, and a pressurized and heparinized saline bag was attached to the catheter. 4 x 40 solitaire device was then selected. Back flush was achieved at the rotating hemostatic valve, and then the device was gently advanced through the microcatheter to the distal end. The retriever was then unsheathed by withdrawing the microcatheter under fluoroscopy. Once the retriever was completely unsheathed, the microcatheter was carefully stripped from the delivery device. Control angiogram was performed from the intermediate catheter. A 3 minute time interval was observed. Solitaire stent was then withdrawn to the 071 catheter under fluoroscopy. Once the retriever was "corked" within the tip of the intermediate catheter, both were removed from the system. Free aspiration was confirmed at the hub of the 088 distal access catheter, with free blood return confirmed. Control angiogram was performed. Partial occlusion remained with TICI 2b flow at this point. We then advanced the zoom  catheter with the microwire and microcatheter combination for a third pass attempt, using the adapt technique. 071 catheter was gently advanced to the face of the clot, and aspiration was performed. Once the catheter was withdrawn into the base 088 distal guide catheter, aspiration was performed at the hub of the 088 catheter. Angiogram was performed. Partial occlusion remained with TICI 2b flow at this point. We then proceeded with a fourth pass. The 071 catheter was then readvanced through the distal access catheter with a coaxial microwire and microcatheter. Microcatheter and the microwire were then gently navigated to the face of the occlusion. The synchro soft wire would not advance through the occlusion. A headliner J wire 012 was then used through the microcatheter, successful in passing through the occlusion into the angular branch. Microcatheter was advanced through the occlusion. Wire was removed. Blood was then aspirated through the hub of the microcatheter, and a gentle contrast injection was performed confirming intraluminal position. A rotating hemostatic valve was then attached to the back end of the microcatheter, and a pressurized and heparinized saline bag was attached to the catheter. 5 mm by 37 mm  EMBO trapped was then selected. Back flush was achieved at the rotating hemostatic valve, and then the device was gently advanced through the microcatheter to the distal end. The retriever was then unsheathed by withdrawing the microcatheter under fluoroscopy. Once the retriever was completely unsheathed, the microcatheter was carefully stripped from the delivery device. Control angiogram was performed from the intermediate catheter. This control angiogram confirmed flow through the EMBO trap, with a lesion at the site of the occlusion, representing native intracranial atherosclerosis. While the stent tree vert remained open for proximally 5 minutes, we had the anesthesia team pass and OG tube. We  administered 650 mg of aspirin. Estimation of the diameter length of the lesion were performed. We selected a drug-eluting balloon mounted stent, 2.25 mm x 15 mm. After approximately 5 minutes, the stent tree vert was removed. Angiogram was performed confirming TICI TB flow, relatively unchanged. We then navigated the microcatheter and the microwire/synchro wire back through the 088 distal access catheter. This combination was successful in crossing the stenosis/lesion in the distal M1. The catheter micro wire were placed into the angular branch and the wire was removed. Gentle injection confirmed location. A transcend wire was then placed through the microcatheter and the microcatheter was removed. A baseline angiogram was performed. We then deployed a balloon mounted drug-eluting stent. We selected a Onyx 2.9mm x 15mm stent. This was deployed with 7 atmosphere inflation. Balloon was deflated and withdrawn. Repeat angiogram was performed. TICI 3 was achieved. Wires and catheters were removed. The distal access catheter was withdrawn to the cervical ICA and repeat angiogram was performed of the cervical segment. The 088 catheter was then withdrawn. The skin at the puncture site was then cleaned with Chlorhexidine. The 8 French sheath was removed and an 105F angioseal was deployed. Flat panel CT was performed. Patient tolerated the procedure well and remained hemodynamically stable throughout. No complications were encountered and no significant blood loss encountered. IMPRESSION: Status post ultrasound guided access right common femoral artery for cervical and cerebral angiogram, mechanical thrombectomy of right M1 occlusion, and rescue stenting of native intracranial atherosclerotic permissive lesion with 2.25 mm x 15 mm drug-eluting stent, restoring TICI 3 flow. Angio-Seal for hemostasis. Signed, Yvone Neu. Reyne Dumas, RPVI [facility] PLAN: The patient will  remain intubated. ICU status Target systolic blood pressure of 120-140 Right hip straight time 6 hours Frequent neurovascular checks Repeat neurologic imaging with CT and/MRI at the discretion of neurology team Dual anti-platelet therapy. Electronically Signed   By: Gilmer Mor D.O.   On: 10/30/2019 20:58   CT HEAD CODE STROKE WO CONTRAST`  Addendum Date: 10/30/2019   ADDENDUM REPORT: 10/30/2019 15:38 ADDENDUM: Study discussed by telephone with Dr. Chesley Noon on 10/30/2019 at 1532 hours. Electronically Signed   By: Odessa Fleming M.D.   On: 10/30/2019 15:38   Result Date: 10/30/2019 CLINICAL DATA:  Code stroke. 74 year old male last known well 1100 hours. Right gaze deviation and aphasia. EXAM: CT HEAD WITHOUT CONTRAST TECHNIQUE: Contiguous axial images were obtained from the base of the skull through the vertex without intravenous contrast. COMPARISON:  Head CT 03/21/2018. FINDINGS: Brain: Stable cerebral volume. No midline shift, mass effect, or evidence of intracranial mass lesion. No ventriculomegaly. No acute intracranial hemorrhage identified. Advanced chronic small vessel ischemia in the bilateral corona radiata and deep gray nuclei, with Patchy and confluent bilateral hypodensity more so on the left. Some associated ex vacuo enlargement of the left lateral ventricle. But gray-white matter  differentiation appears stable since 2019. No cortically based acute infarct identified. No cortical encephalomalacia identified. Vascular: Mild Calcified atherosclerosis at the skull base. No suspicious intracranial vascular hyperdensity. Skull: Stable, negative. Sinuses/Orbits: Chronic bubbly opacity in the left sphenoid. Other Visualized paranasal sinuses and mastoids are stable and well pneumatized. Other: Rightward gaze deviation. No acute scalp soft tissue finding. ASPECTS [facility] Stroke Program Early CT Score) Total score (0-10 with 10 being normal): 10 (chronic encephalomalacia). IMPRESSION: 1. Advanced chronic  small vessel disease appears stable by CT since 2019. 2. No acute cortically based infarct or acute intracranial hemorrhage identified. ASPECTS 10. Electronically Signed: By: Genevie Ann M.D. On: 10/30/2019 15:25    Labs:  CBC: Recent Labs    10/30/19 1527 10/30/19 2134 11/01/19 0500  WBC 6.3  --  7.5  HGB 14.0 11.6* 12.3*  HCT 44.0 34.0* 38.9*  PLT 179  --  158    COAGS: Recent Labs    10/30/19 1527  INR 1.0  APTT 33    BMP: Recent Labs    10/30/19 1527 10/30/19 2134 11/01/19 0500  NA 138 138 142  K 3.9 3.7 3.6  CL 105  --  111  CO2 25  --  24  GLUCOSE 131*  --  137*  BUN 16  --  8  CALCIUM 8.9  --  8.4*  CREATININE 0.87  --  0.80  GFRNONAA >60  --  >60  GFRAA >60  --  >60    LIVER FUNCTION TESTS: Recent Labs    10/30/19 1527  BILITOT 0.8  AST 23  ALT 31  ALKPHOS 81  PROT 7.4  ALBUMIN 3.7    Assessment and Plan: R MCA CVA s/p cerebral arteriogram with emergent mechanical thrombectomy of right MCA M1 occlusion along with rescue stenting of right MCA M1 underlying stenosis achieving a TICI 3 revascularization 10/30/2019 by Dr. Earleen Newport. Extubated.  Sitting comfortably in chair.  Baseline right-sided weakness from prior stroke.  Appears overall intact on the left with good strength and ROM.  Speech slurred and slow but he is intelligible.   Continue taking Plavix 75 mg once daily and Aspirin 81 mg once daily.  Recommended for 6 months per Neuro discretion.   NIR remains available.  No follow-up needed at this time.    Electronically Signed: Docia Barrier, PA 11/01/2019, 10:08 AM   I spent a total of 15 Minutes at the the patient's bedside AND on the patient's hospital floor or unit, greater than 50% of which was counseling/coordinating care for CVA s/p revascularization.

## 2019-11-01 NOTE — Progress Notes (Signed)
Lower extremity venous has been completed.   Preliminary results in CV Proc.   Blanch Media 11/01/2019 11:40 AM

## 2019-11-01 NOTE — Consult Note (Signed)
NAME:  Kodah Maret, MRN:  [id number], DOB:  [birth year], LOS: 2 ADMISSION DATE:  10/30/2019, CONSULTATION DATE:  10/30/19 REFERRING MD: Wilford Corner, CHIEF COMPLAINT:  aphasia  Brief History   74 year old man with hx of stroke, HTN, here with R M1 MCA stroke, s/p mechanical thrombectomy and stent on 5/18.   History of present illness    Presents with intermittent aphasia, waxing and waning on 5/18, finally persistent aphasia at around 2pm. Presented to [facility] ER.  Aphasic and L sided neglect on presentation to ED but alert.    On presentation, some RUE seizure like movements, increased tone.  Given ativan and Keppra, some decrease in abnormal movements but persistent aphasia and neglect.  CTA revealed R M1 MCA occlusion.  Transferred to [facility] for IR mechanical thrombectomy and stent.    Past Medical History  Hx L MCA CVA, r sided weakness HTN  Hx abdominal surgery  Significant Hospital Events   5/18 mechanical thrombectomy and stent R M1 MCA  Consults:  PCCM IR  Neurology primary  Procedures:  5/18 intubation for procedure  Significant Diagnostic Tests:  CT 5/18 - no hemorrhage, right MCA occlusion.  EEG 5/19 - slow but no seizures.  Micro Data:  none  Antimicrobials:  none  Interim history/subjective:  Successfully extubated yesterday.   Objective   Blood pressure 133/85, pulse 75, temperature 97.9 F (36.6 C), temperature source Oral, resp. rate (!) 23, height 5\' 10"  (1.778 m), SpO2 99 %.        Intake/Output Summary (Last 24 hours) at 11/01/2019 1105 Last data filed at 11/01/2019 11/03/2019 Gross per 24 hour  Intake 779.64 ml  Output 2450 ml  Net -1670.36 ml   There were no vitals filed for this visit.  Examination: General: NAD, awake and in chair.  HENT:  Moist mucosa Lungs: CTAB.  Cardiovascular: HS normal Abdomen: NT, ND, NBS Extremities: B LE edema, chronic appearing skin changes.   Neuro: following 2-step commands predictably on right side, weaker on left.  Speech dysarthric. GU: Condom catheter.  Resolved Hospital Problem list     Assessment & Plan:   Was Critically ill due to acute hypoxic respiratory failure requiring mechanical ventilation.  - Tolerated extubation - progressive ambulation  Was Critically ill due to acute ischemic stroke from right MCA occlusion post intervention requiring titration of clevidepine  BP now well controlled on oral medications - Continue oral agents  Dysphagia and dysarthria due to CVA -for SLP swallow evaluation and may need PEG tube.  Secondary stroke prevention work up as per neurology.   Daily Goals Checklist  Pain/Anxiety/Delirium protocol (if indicated): none Neuro vitals: every 4 hours AED's: none VAP protocol (if indicated): extubated  Respiratory support goals: progressive mobilization. Blood pressure target:  <160 DVT prophylaxis: heparin Bollinger tid. Nutrition Status: swallow evaluation today.  GI prophylaxis: not indicated. Fluid status goals: allow autoregulation.  Urinary catheter: external catheter Central lines: PIV only Glucose control: euglycemic T2DM on no treatment Mobility/therapy needs: PT/OT/SLP Antibiotic de-escalation: none Home medication reconciliation: on hold Daily labs: BMET Code Status: full Family Communication: Stroke service to update Disposition: ready for transfer.   Labs   CBC: Recent Labs  Lab 10/30/19 1527 10/30/19 2134 11/01/19 0500  WBC 6.3  --  7.5  NEUTROABS 3.5  --   --   HGB 14.0 11.6* 12.3*  HCT 44.0 34.0* 38.9*  MCV 85.8  --  87.8  PLT 179  --  158    Basic Metabolic Panel: Recent  Labs  Lab 10/30/19 1527 10/30/19 2134 11/01/19 0500  NA 138 138 142  K 3.9 3.7 3.6  CL 105  --  111  CO2 25  --  24  GLUCOSE 131*  --  137*  BUN 16  --  8  CREATININE 0.87  --  0.80  CALCIUM 8.9  --  8.4*   GFR: Estimated Creatinine Clearance: 95.1 mL/min (by C-G formula based on SCr of 0.8 mg/dL). Recent Labs  Lab 10/30/19 1527  11/01/19 0500  WBC 6.3 7.5    Liver Function Tests: Recent Labs  Lab 10/30/19 1527  AST 23  ALT 31  ALKPHOS 81  BILITOT 0.8  PROT 7.4  ALBUMIN 3.7   No results for input(s): LIPASE, AMYLASE in the last 168 hours. No results for input(s): AMMONIA in the last 168 hours.  ABG    Component Value Date/Time   PHART 7.412 10/30/2019 2134   PCO2ART 37.7 10/30/2019 2134   PO2ART 181 (H) 10/30/2019 2134   HCO3 24.1 10/30/2019 2134   TCO2 25 10/30/2019 2134   O2SAT 100.0 10/30/2019 2134     Coagulation Profile: Recent Labs  Lab 10/30/19 1527  INR 1.0    Cardiac Enzymes: No results for input(s): CKTOTAL, CKMB, CKMBINDEX, TROPONINI in the last 168 hours.  HbA1C: Hgb A1c MFr Bld  Date/Time Value Ref Range Status  10/31/2019 05:51 AM 7.4 (H) 4.8 - 5.6 % Final    Comment:    (NOTE)         Prediabetes: 5.7 - 6.4         Diabetes: >6.4         Glycemic control for adults with diabetes: <7.0     CBG: Recent Labs  Lab 10/31/19 1601 10/31/19 1939 10/31/19 2337 11/01/19 0329 11/01/19 [place], MD [facility] ICU Physician [place]  Pager: [phone] Mobile: [phone] After hours: [phone].

## 2019-11-01 NOTE — Consult Note (Signed)
Physical Medicine and Rehabilitation Consult Reason for Consult: Left side weakness and confusion with aphasia Referring Physician: Dr.Xu   HPI: Arye Weyenberg is a 74 y.o. right-handed male with history of CVA with residual right-sided weakness, morbid obesity with BMI 31.03, tobacco abuse on no prescription medications and reported medical noncompliance.  Per chart review lives with spouse independent prior to admission but rather sedentary.  1 level home 4 steps to entry.  Presented 10/30/2019 to [facility] with aphasia as well as altered mental status and left side weakness.  Cranial CT scan showed advanced chronic small vessel disease no acute changes.  CT angiogram of head and neck distal right M1 MCA occlusion.  No hemodynamically significant stenosis in the neck.  Patient underwent emergent thrombectomy of right MCA M1 occlusion with rescue stenting per Dr. Loreta Ave of interventional radiology.  A follow-up MRI showed scattered small foci of restricted diffusion throughout the right MCA territory with a cluster at the temporal occipital region consistent with acute/subacute infarction.  Echocardiogram pending.  EEG negative for seizure but was suggestive of mild diffuse encephalopathy admission chemistries unremarkable except glucose 131, urine drug screen negative, SARS coronavirus negative.  Presently maintained on aspirin and Plavix for CVA prophylaxis for 3 to 6 months due to stent placement.  Subcutaneous heparin for DVT prophylaxis.  Therapy evaluations completed with recommendations of physical medicine rehab consult   Pt reports he's hungry- hasn't had a BM in a few days because hasn't been fed in 2+ days, since stroke. Per nurse, SLP to see him this afternoon, and determine if can eat, have Coretrack.   Pt denies pain all all- has external catheter- doesn't know why.    Review of Systems  Constitutional: Negative for chills and fever.  HENT: Negative for  hearing loss.   Eyes: Negative for blurred vision and double vision.  Respiratory: Negative for cough and shortness of breath.   Cardiovascular: Positive for leg swelling. Negative for chest pain and palpitations.  Gastrointestinal: Positive for constipation. Negative for heartburn and nausea.  Genitourinary: Negative for dysuria and hematuria.  Musculoskeletal: Positive for myalgias.  Skin: Negative for rash.  Neurological: Positive for speech change and weakness.  All other systems reviewed and are negative.  Past Medical History:  Diagnosis Date  . Stroke [facility])    Past Surgical History:  Procedure Laterality Date  . ABDOMINAL SURGERY    . IR CT HEAD LTD  10/30/2019  . IR INTRA CRAN STENT  10/30/2019  . IR PERCUTANEOUS ART THROMBECTOMY/INFUSION INTRACRANIAL INC DIAG ANGIO  10/30/2019  . IR US GUIDE VASC ACCESS RIGHT  10/30/2019  . RADIOLOGY WITH ANESTHESIA N/A 10/30/2019   Procedure: IR WITH ANESTHESIA;  Surgeon: Gilmer Mor, DO;  Location: [facility] OR;  Service: Radiology;  Laterality: N/A;   Family History  Problem Relation Age of Onset  . Hypertension Mother   . Hypertension Father    Social History:  reports that he has quit smoking. He has never used smokeless tobacco. He reports that he does not drink alcohol or use drugs. Allergies: No Known Allergies No medications prior to admission.    Home: Home Living Family/patient expects to be discharged to:: Private residence Living Arrangements: Spouse/significant other Available Help at Discharge: Family, Available 24 hours/day Type of Home: House Home Access: Stairs to enter [employer] of Steps: 4 Entrance Stairs-Rails: Can reach both Home Layout: One level Bathroom Shower/Tub: Engineer, manufacturing systems: Standard Home Equipment: Cane - single point,  Shower seat  Functional History: Prior Function Level of Independence: Independent Comments: pt ambulates household and limited community distances, raises  hogs Functional Status:  Mobility: Bed Mobility Overal bed mobility: Needs Assistance Bed Mobility: Supine to Sit Supine to sit: Mod assist Transfers Overall transfer level: Needs assistance Equipment used: Rolling walker (2 wheeled) Transfers: Sit to/from Stand Sit to Stand: Mod assist, From elevated surface Stand pivot transfers: Max assist General transfer comment: pt with R knee extension, unable to advance RLE, PT providing significant weight shift and facilitation of pivot to aide in transfer Ambulation/Gait Ambulation/Gait assistance: Mod assist Gait Distance (Feet): 8 Feet(additional trial of 3' turning from bed to recliner) Assistive device: Rolling walker (2 wheeled) Gait Pattern/deviations: Step-to pattern, Shuffle, Trunk flexed General Gait Details: pt with short shuffling steps, significant trunk flexion which is exacerbated by fatigue despite PT cues for upright posture. Pt is able to minimally clear both feet with less clearance of RLE Gait velocity: reduced Gait velocity interpretation: <1.31 ft/sec, indicative of household ambulator    ADL:    Cognition: Cognition Overall Cognitive Status: Impaired/Different from baseline Orientation Level: (P) Oriented to person, Oriented to place, Oriented to situation, Disoriented to time Cognition Arousal/Alertness: Awake/alert Behavior During Therapy: WFL for tasks assessed/performed Overall Cognitive Status: Impaired/Different from baseline Area of Impairment: Memory, Safety/judgement, Awareness, Problem solving Orientation Level: Disoriented to, Place, Time(pt thinks he is still at [facility]) Memory: Decreased short-term memory, Decreased recall of precautions Safety/Judgement: Decreased awareness of safety, Decreased awareness of deficits Awareness: Intellectual Problem Solving: Slow processing, Requires verbal cues  Blood pressure 127/81, pulse 67, temperature 97.9 F (36.6 C), temperature source Oral, resp. rate 19,  SpO2 95 %. Physical Exam  Nursing note and vitals reviewed. Constitutional: He appears well-developed and well-nourished.  Older male appears younger than age; sitting up at bedside chair, on monitor, appropriate but a little confused, NAD  HENT:  Head: Normocephalic and atraumatic.  Nose: Nose normal.  Mouth/Throat: Oropharynx is clear and moist. No oropharyngeal exudate.  Attempted to smile- has oral apraxia and unable to  Tongue deviated to R Coated with brownish coat of something  Eyes: Conjunctivae are normal.  EOMI B/L- nystagmus L>R  Neck: No tracheal deviation present.  Cardiovascular:  RRR_ HR 60-75 while there- no JVD  Respiratory:  CTA B/L- no W/R/R- good air movement  GI:  Soft, NT, slightly distended; hypoactive BS  Genitourinary:    Genitourinary Comments: External catheter attached to purewick container.    Musculoskeletal:     Cervical back: Normal range of motion and neck supple.     Comments: UEs- biceps 4-/5, triceps 4-/5, grip 4/5, finger abd 3+/5 B/L LEs- HF 2-/5, KE 3+/5, DF R 2-/5 L 3/5, PF 4-/5   Neurological: He is alert.  Patient is alert in no acute distress.  He does make eye contact with examiner.  He is dysarthric.  Follows simple commands.  Dysarthric- sounds like has mouth ful of marbles Verbal and oral apraxia noted Intact to light touch in all 4 extremities No hoffman's B/L No clonus B/L  Skin:  Intact- unable to assess backside since in chair/on monitor  Psychiatric:  Polite/appropriate- sleepy    Results for orders placed or performed during the hospital encounter of 10/30/19 (from the past 24 hour(s))  Glucose, capillary     Status: Abnormal   Collection Time: 10/31/19 11:55 AM  Result Value Ref Range   Glucose-Capillary 159 (H) 70 - 99 mg/dL   Comment 1 Notify RN  Comment 2 Document in Chart   Glucose, capillary     Status: Abnormal   Collection Time: 10/31/19  4:01 PM  Result Value Ref Range   Glucose-Capillary 132 (H) 70 -  99 mg/dL   Comment 1 Notify RN    Comment 2 Document in Chart   Glucose, capillary     Status: Abnormal   Collection Time: 10/31/19  7:39 PM  Result Value Ref Range   Glucose-Capillary 146 (H) 70 - 99 mg/dL   Comment 1 Notify RN    Comment 2 Document in Chart   Glucose, capillary     Status: Abnormal   Collection Time: 10/31/19 11:37 PM  Result Value Ref Range   Glucose-Capillary 132 (H) 70 - 99 mg/dL   Comment 1 Notify RN    Comment 2 Document in Chart   Glucose, capillary     Status: Abnormal   Collection Time: 11/01/19  3:29 AM  Result Value Ref Range   Glucose-Capillary 127 (H) 70 - 99 mg/dL   Comment 1 Notify RN    Comment 2 Document in Chart   CBC     Status: Abnormal   Collection Time: 11/01/19  5:00 AM  Result Value Ref Range   WBC 7.5 4.0 - 10.5 K/uL   RBC 4.43 4.22 - 5.81 MIL/uL   Hemoglobin 12.3 (L) 13.0 - 17.0 g/dL   HCT 38.9 (L) 39.0 - 52.0 %   MCV 87.8 80.0 - 100.0 fL   MCH 27.8 26.0 - 34.0 pg   MCHC 31.6 30.0 - 36.0 g/dL   RDW 13.7 11.5 - 15.5 %   Platelets 158 150 - 400 K/uL   nRBC 0.0 0.0 - 0.2 %  Basic metabolic panel     Status: Abnormal   Collection Time: 11/01/19  5:00 AM  Result Value Ref Range   Sodium 142 135 - 145 mmol/L   Potassium 3.6 3.5 - 5.1 mmol/L   Chloride 111 98 - 111 mmol/L   CO2 24 22 - 32 mmol/L   Glucose, Bld 137 (H) 70 - 99 mg/dL   BUN 8 8 - 23 mg/dL   Creatinine, Ser 0.80 0.61 - 1.24 mg/dL   Calcium 8.4 (L) 8.9 - 10.3 mg/dL   GFR calc non Af Amer >60 >60 mL/min   GFR calc Af Amer >60 >60 mL/min   Anion gap 7 5 - 15  Triglycerides     Status: Abnormal   Collection Time: 11/01/19  5:00 AM  Result Value Ref Range   Triglycerides 240 (H) <150 mg/dL  Glucose, capillary     Status: Abnormal   Collection Time: 11/01/19  7:38 AM  Result Value Ref Range   Glucose-Capillary 115 (H) 70 - 99 mg/dL   CT Angio Head W or Wo Contrast  Result Date: 10/30/2019 CLINICAL DATA:  Code stroke follow-up EXAM: CT ANGIOGRAPHY HEAD AND NECK  TECHNIQUE: Multidetector CT imaging of the head and neck was performed using the standard protocol during bolus administration of intravenous contrast. Multiplanar CT image reconstructions and MIPs were obtained to evaluate the vascular anatomy. Carotid stenosis measurements (when applicable) are obtained utilizing NASCET criteria, using the distal internal carotid diameter as the denominator. CONTRAST:  67mL OMNIPAQUE IOHEXOL 350 MG/ML SOLN COMPARISON:  Earlier same day FINDINGS: CT HEAD FINDINGS Brain: There is no acute intracranial hemorrhage, mass effect, or edema. No new loss of gray-white differentiation. Chronic infarctions and chronic microvascular ischemic changes as described previously. Vascular: No new finding Skull: No new  finding Sinuses: No new finding Orbits: No new finding Review of the MIP images confirms the above findings CTA NECK FINDINGS Aortic arch: Great vessel origins are patent. There is direct origin of the left vertebral artery from the arch. Right carotid system: Patent. No measurable stenosis at the ICA origin. Retropharyngeal course of the ICA. Left carotid system: Patent. No measurable stenosis at the ICA origin. Retropharyngeal course of the ICA. Vertebral arteries: Patent and codominant. Skeleton: Multilevel degenerative changes of the cervical spine. Other neck: No mass or adenopathy. Upper chest: No apical lung mass. Review of the MIP images confirms the above findings CTA HEAD FINDINGS Anterior circulation: Intracranial internal carotid arteries are patent. There is occlusion of the distal right M1 MCA. Diminished flow within the more distal right MCA territory. Anterior and left middle cerebral arteries are patent. Posterior circulation: Intracranial vertebral arteries are patent. Basilar artery is patent with focal short segment moderate stenosis including the level of left AICA origin. Posterior cerebral arteries are patent. There is a patent right posterior communicating  artery. Venous sinuses: Not well evaluated on this study. Review of the MIP images confirms the above findings IMPRESSION: Distal right M1 MCA occlusion. Partial reconstitution of more distal right MCA territory. No acute intracranial hemorrhage.  ASPECT score remains 10. No hemodynamically significant stenosis in the neck. These results were called by telephone at the time of interpretation on 10/30/2019 at 4:28 pm to provider [facility] , who verbally acknowledged these results. Electronically Signed   By: Guadlupe Spanish M.D.   On: 10/30/2019 16:32   CT Angio Neck W and/or Wo Contrast  Result Date: 10/30/2019 CLINICAL DATA:  Code stroke follow-up EXAM: CT ANGIOGRAPHY HEAD AND NECK TECHNIQUE: Multidetector CT imaging of the head and neck was performed using the standard protocol during bolus administration of intravenous contrast. Multiplanar CT image reconstructions and MIPs were obtained to evaluate the vascular anatomy. Carotid stenosis measurements (when applicable) are obtained utilizing NASCET criteria, using the distal internal carotid diameter as the denominator. CONTRAST:  19mL OMNIPAQUE IOHEXOL 350 MG/ML SOLN COMPARISON:  Earlier same day FINDINGS: CT HEAD FINDINGS Brain: There is no acute intracranial hemorrhage, mass effect, or edema. No new loss of gray-white differentiation. Chronic infarctions and chronic microvascular ischemic changes as described previously. Vascular: No new finding Skull: No new finding Sinuses: No new finding Orbits: No new finding Review of the MIP images confirms the above findings CTA NECK FINDINGS Aortic arch: Great vessel origins are patent. There is direct origin of the left vertebral artery from the arch. Right carotid system: Patent. No measurable stenosis at the ICA origin. Retropharyngeal course of the ICA. Left carotid system: Patent. No measurable stenosis at the ICA origin. Retropharyngeal course of the ICA. Vertebral arteries: Patent and codominant. Skeleton:  Multilevel degenerative changes of the cervical spine. Other neck: No mass or adenopathy. Upper chest: No apical lung mass. Review of the MIP images confirms the above findings CTA HEAD FINDINGS Anterior circulation: Intracranial internal carotid arteries are patent. There is occlusion of the distal right M1 MCA. Diminished flow within the more distal right MCA territory. Anterior and left middle cerebral arteries are patent. Posterior circulation: Intracranial vertebral arteries are patent. Basilar artery is patent with focal short segment moderate stenosis including the level of left AICA origin. Posterior cerebral arteries are patent. There is a patent right posterior communicating artery. Venous sinuses: Not well evaluated on this study. Review of the MIP images confirms the above findings IMPRESSION: Distal right M1 MCA  occlusion. Partial reconstitution of more distal right MCA territory. No acute intracranial hemorrhage.  ASPECT score remains 10. No hemodynamically significant stenosis in the neck. These results were called by telephone at the time of interpretation on 10/30/2019 at 4:28 pm to provider [facility] , who verbally acknowledged these results. Electronically Signed   By: Guadlupe Spanish M.D.   On: 10/30/2019 16:32   MR ANGIO HEAD WO CONTRAST  Result Date: 10/31/2019 CLINICAL DATA:  Stroke follow-up. EXAM: MRI HEAD WITHOUT CONTRAST MRA HEAD WITHOUT CONTRAST TECHNIQUE: Multiplanar, multiecho pulse sequences of the brain and surrounding structures were obtained without intravenous contrast. Angiographic images of the head were obtained using MRA technique without contrast. COMPARISON:  Head CT Oct 30, 2019 FINDINGS: The study is degraded by motion. MRI HEAD FINDINGS Brain: Scattered foci of restricted diffusion is seen throughout the right MCA territory with a cluster at the temporal occipital region, consistent with acute/subacute infarcts. A focus of restricted diffusion is also seen right  cerebellar hemisphere. Scattered and confluent foci of T2 hyperintensity are seen within the white matter of cerebral hemispheres, nonspecific, most likely related to chronic small vessel ischemia. Remote infarcts are seen in the bilateral corona radiata, bilateral basal ganglia and pons. Foci of susceptibility artifact are seen in the bilateral parietal regions, pons and right cerebellar hemisphere, most consistent with hemosiderin deposits. No acute hemorrhage, hydrocephalus, extra-axial collection or mass lesion. Vascular: Normal flow voids. Skull and upper cervical spine: Reversal of the cervical curvature. There is diffuse decreased T1 signal within the visualized upper cervical spine, may represent red marrow reconversion versus marrow replacement. Correlate clinically. Sinuses/Orbits: Mucosal thickening throughout the paranasal sinuses with fluid level within the left sphenoid sinus. The orbits are grossly unremarkable. Other: None. MRA HEAD FINDINGS Visualized upper cervical and intracranial internal carotid arteries have normal flow related enhancement without evidence of occlusion or stenosis. Status post stenting of the mid/distal M1/MCA segment. Susceptibility artifact precludes evaluation of state patency. However, normal flow related enhancement is seen in most of the M2 branches, with the exception of one middle division branch which has attenuated flow related enhancement (series 3, image 109). The bilateral ACA and left MCA vascular tree have normal flow related enhancement. The intracranial segment of the bilateral vertebral arteries, the basilar artery and bilateral posterior cerebral arteries have normal course and caliber and flow related enhancement without evidence of stenosis or occlusion. IMPRESSION: 1. Scattered small foci of restricted diffusion throughout the right MCA territory with a cluster at the temporal occipital region, consistent with acute/subacute infarcts. 2. A focus of  restricted diffusion is also seen in the right cerebellar hemisphere. 3. Advanced chronic small vessel ischemia. Remote infarcts in the bilateral corona radiata, bilateral basal ganglia and pons. 4. Status post stenting of the mid/distal M1/MCA segment. Normal flow related enhancement is seen in most of the M2 branches, with the exception of one middle division branch which has attenuated flow related enhancement. 5. Diffuse decreased T1 signal within the visualized upper cervical spine, may represent red marrow reconversion versus marrow replacement. Correlate clinically. Electronically Signed   By: Baldemar Lenis M.D.   On: 10/31/2019 15:36   MR BRAIN WO CONTRAST  Result Date: 10/31/2019 CLINICAL DATA:  Stroke follow-up. EXAM: MRI HEAD WITHOUT CONTRAST MRA HEAD WITHOUT CONTRAST TECHNIQUE: Multiplanar, multiecho pulse sequences of the brain and surrounding structures were obtained without intravenous contrast. Angiographic images of the head were obtained using MRA technique without contrast. COMPARISON:  Head CT Oct 30, 2019  FINDINGS: The study is degraded by motion. MRI HEAD FINDINGS Brain: Scattered foci of restricted diffusion is seen throughout the right MCA territory with a cluster at the temporal occipital region, consistent with acute/subacute infarcts. A focus of restricted diffusion is also seen right cerebellar hemisphere. Scattered and confluent foci of T2 hyperintensity are seen within the white matter of cerebral hemispheres, nonspecific, most likely related to chronic small vessel ischemia. Remote infarcts are seen in the bilateral corona radiata, bilateral basal ganglia and pons. Foci of susceptibility artifact are seen in the bilateral parietal regions, pons and right cerebellar hemisphere, most consistent with hemosiderin deposits. No acute hemorrhage, hydrocephalus, extra-axial collection or mass lesion. Vascular: Normal flow voids. Skull and upper cervical spine: Reversal of  the cervical curvature. There is diffuse decreased T1 signal within the visualized upper cervical spine, may represent red marrow reconversion versus marrow replacement. Correlate clinically. Sinuses/Orbits: Mucosal thickening throughout the paranasal sinuses with fluid level within the left sphenoid sinus. The orbits are grossly unremarkable. Other: None. MRA HEAD FINDINGS Visualized upper cervical and intracranial internal carotid arteries have normal flow related enhancement without evidence of occlusion or stenosis. Status post stenting of the mid/distal M1/MCA segment. Susceptibility artifact precludes evaluation of state patency. However, normal flow related enhancement is seen in most of the M2 branches, with the exception of one middle division branch which has attenuated flow related enhancement (series 3, image 109). The bilateral ACA and left MCA vascular tree have normal flow related enhancement. The intracranial segment of the bilateral vertebral arteries, the basilar artery and bilateral posterior cerebral arteries have normal course and caliber and flow related enhancement without evidence of stenosis or occlusion. IMPRESSION: 1. Scattered small foci of restricted diffusion throughout the right MCA territory with a cluster at the temporal occipital region, consistent with acute/subacute infarcts. 2. A focus of restricted diffusion is also seen in the right cerebellar hemisphere. 3. Advanced chronic small vessel ischemia. Remote infarcts in the bilateral corona radiata, bilateral basal ganglia and pons. 4. Status post stenting of the mid/distal M1/MCA segment. Normal flow related enhancement is seen in most of the M2 branches, with the exception of one middle division branch which has attenuated flow related enhancement. 5. Diffuse decreased T1 signal within the visualized upper cervical spine, may represent red marrow reconversion versus marrow replacement. Correlate clinically. Electronically Signed    By: Baldemar Lenis M.D.   On: 10/31/2019 15:36   IR CT Head Ltd  Result Date: 10/30/2019 INDICATION: 74 year old male with acute stroke, right MCA M1 occlusion EXAM: ULTRASOUND GUIDED ACCESS RIGHT COMMON FEMORAL ARTERY CERVICAL AND CEREBRAL ANGIOGRAM MECHANICAL THROMBECTOMY RIGHT MCA/M1 RESCUE STENT OF RIGHT MCA INTRACRANIAL ATHEROSCLEROSIS ANGIO-SEAL FOR CLOSURE COMPARISON:  CT imaging same day MEDICATIONS: 3 mg intra arterial integral and, 650 mg aspirin OG, 300 mg Plavix OG ANESTHESIA/SEDATION: The anesthesia team was present to provide general endotracheal tube anesthesia and for patient monitoring during the procedure. Intubation was performed in negative pressure [place] in neuro IR holding. Interventional neuro radiology nursing staff was also present. CONTRAST:  170 cc Omnipaque 300 FLUOROSCOPY TIME:  Fluoroscopy Time: 40 minutes 42 seconds (3,316 mGy). COMPLICATIONS: None TECHNIQUE: Informed written consent was obtained from the patient's family after a thorough discussion of the procedural risks, benefits and alternatives. Specific risks discussed include: Bleeding, infection, contrast reaction, kidney injury/failure, need for further procedure/surgery, arterial injury or dissection, embolization to new territory, intracranial hemorrhage (10-15% risk), neurologic deterioration, cardiopulmonary collapse, death. All questions were addressed. Maximal Sterile Barrier Technique  was utilized including during the procedure including caps, mask, sterile gowns, sterile gloves, sterile drape, hand hygiene and skin antiseptic. A timeout was performed prior to the initiation of the procedure. The anesthesia team was present to provide general endotracheal tube anesthesia and for patient monitoring during the procedure. Interventional neuro radiology nursing staff was also present. FINDINGS: Initial Findings: Right internal carotid artery: Tortuosity of the cervical segment. Vertical and petrous segment  patent with normal course caliber contour. Cavernous segment patent. Clinoid segment patent. Antegrade flow of the ophthalmic artery. Ophthalmic segment patent. Terminus patent. Right MCA: Proximal M1 segment is patent. There is a tapered cuff-off of the mid segment of the M1, in the region the mid orbit. Proximal temporal branch remains patent. TICI 0 flow through the M1 segment. There late filling with collaterals via the right anterior cerebral artery and the right posterior cerebral artery via leptomeningeal collateral vessels. Patent anterior communicating artery with the temporal region supplied by the PCA. Right ACA: A 1 segment patent. A 2 segment perfuses the right territory. Patent anterior communicating artery with significant cross-filling of the left anterior cerebral artery. Completion Findings: During the mechanical thrombectomy, the initial 2 passes resulted in TICI 2B flow, with partial filling of greater than 50% of the right-sided hemispheric MCA branches. The cortical branches after the first 2 passes are filling both from leptomeningeal collaterals as well as through the attenuated M1 segment. The final pass with the EMBO trapped device uncovered atherosclerotic plaque at the distal M1 segment. This atherosclerotic plaque was then treated with drug-eluting balloon mounted stent. Right MCA: TICI 3 flow was restored through the right hemisphere. The stent was successful in restoring flow through the disease segment of the M1 Right common carotid artery:  Normal course caliber and contour. Right external carotid artery: Patent with antegrade flow. Flat panel CT demonstrates minimal staining within the supra ganglionic right MCA territory with no subarachnoid hemorrhage or large parenchymal hemorrhage. PROCEDURE: Patient was brought to the [facility] suite, and identity was confirmed. Patient was prepped and draped in the usual sterile fashion. Ultrasound survey of the right inguinal region was performed  with images stored and sent to PACs. 11 blade scalpel was used to make a small incision. Blunt dissection was performed with US guidance. A micropuncture needle was used access the right common femoral artery under ultrasound. With excellent arterial blood flow returned, an .018 micro wire was passed through the needle, observed to enter the abdominal aorta under fluoroscopy. The needle was removed, and a micropuncture sheath was placed over the wire. The inner dilator and wire were removed, and an 035 wire was advanced under fluoroscopy into the abdominal aorta. The sheath was removed and a 25cm 87F straight vascular sheath was placed. The dilator was removed and the sheath was flushed. Limited angiogram was performed. Sheath was attached to pressurized and heparinized saline bag for constant forward flow. A coaxial system was then advanced over the 035 wire. This included a 110 cm Zoom 088 catheter with coaxial 125cm Berenstein diagnostic catheter. This was advanced to the proximal descending thoracic aorta. Wire was then removed. Double flush of the catheter was performed. Catheter was then used to select the innominate artery and the common carotid artery. The catheter combination was then navigated into the cervical ICA segment. The Berenstein catheter and the Glidewire were removed. Angiogram was performed. Road map function was used once the occluded vessel was identified. Copious back flush was performed and the 088 catheter was attached to  heparinized and pressurized saline bag for forward flow. A second coaxial system was then advanced through the balloon catheter, which included the selected intermediate catheter, microcatheter, and microwire. In this scenario, the set up included a 137 cm 071 Zoom catheter, a Trevo Provue18 microcatheter, and 014 synchro soft wire. This system was advanced through the guide catheter under the road-map function, with adequate back-flush at the rotating hemostatic valve at  that back end of the balloon guide. Microcatheter and the intermediate catheter system were advanced through the terminal ICA and MCA to the level of the occlusion. The micro wire in the microcatheter were then removed. These 071 Zoom catheter was then advanced to the face of the occlusion for the first pass attempt, adapt technique. Catheter was withdrawn while at the same time the 088 distal access catheter was advanced through the carotid siphon to the terminus. Angiogram confirmed persistent occlusion. The 071 catheter was then readvanced through the distal access catheter with a coaxial microwire and microcatheter. Microcatheter and the microwire were then gently navigated through the occlusion into angular branch of the MCA. Microcatheter was advanced through the occlusion. Wire was removed. Blood was then aspirated through the hub of the microcatheter, and a gentle contrast injection was performed confirming intraluminal position. A rotating hemostatic valve was then attached to the back end of the microcatheter, and a pressurized and heparinized saline bag was attached to the catheter. 4 x 40 solitaire device was then selected. Back flush was achieved at the rotating hemostatic valve, and then the device was gently advanced through the microcatheter to the distal end. The retriever was then unsheathed by withdrawing the microcatheter under fluoroscopy. Once the retriever was completely unsheathed, the microcatheter was carefully stripped from the delivery device. Control angiogram was performed from the intermediate catheter. A 3 minute time interval was observed. Solitaire stent was then withdrawn to the 071 catheter under fluoroscopy. Once the retriever was "corked" within the tip of the intermediate catheter, both were removed from the system. Free aspiration was confirmed at the hub of the 088 distal access catheter, with free blood return confirmed. Control angiogram was performed. Partial occlusion  remained with TICI 2b flow at this point. We then advanced the zoom catheter with the microwire and microcatheter combination for a third pass attempt, using the adapt technique. 071 catheter was gently advanced to the face of the clot, and aspiration was performed. Once the catheter was withdrawn into the base 088 distal guide catheter, aspiration was performed at the hub of the 088 catheter. Angiogram was performed. Partial occlusion remained with TICI 2b flow at this point. We then proceeded with a fourth pass. The 071 catheter was then readvanced through the distal access catheter with a coaxial microwire and microcatheter. Microcatheter and the microwire were then gently navigated to the face of the occlusion. The synchro soft wire would not advance through the occlusion. A headliner J wire 012 was then used through the microcatheter, successful in passing through the occlusion into the angular branch. Microcatheter was advanced through the occlusion. Wire was removed. Blood was then aspirated through the hub of the microcatheter, and a gentle contrast injection was performed confirming intraluminal position. A rotating hemostatic valve was then attached to the back end of the microcatheter, and a pressurized and heparinized saline bag was attached to the catheter. 5 mm by 37 mm EMBO trapped was then selected. Back flush was achieved at the rotating hemostatic valve, and then the device was gently advanced through the  microcatheter to the distal end. The retriever was then unsheathed by withdrawing the microcatheter under fluoroscopy. Once the retriever was completely unsheathed, the microcatheter was carefully stripped from the delivery device. Control angiogram was performed from the intermediate catheter. This control angiogram confirmed flow through the EMBO trap, with a lesion at the site of the occlusion, representing native intracranial atherosclerosis. While the stent tree vert remained open for  proximally 5 minutes, we had the anesthesia team pass and OG tube. We administered 650 mg of aspirin. Estimation of the diameter length of the lesion were performed. We selected a drug-eluting balloon mounted stent, 2.25 mm x 15 mm. After approximately 5 minutes, the stent tree vert was removed. Angiogram was performed confirming TICI TB flow, relatively unchanged. We then navigated the microcatheter and the microwire/synchro wire back through the 088 distal access catheter. This combination was successful in crossing the stenosis/lesion in the distal M1. The catheter micro wire were placed into the angular branch and the wire was removed. Gentle injection confirmed location. A transcend wire was then placed through the microcatheter and the microcatheter was removed. A baseline angiogram was performed. We then deployed a balloon mounted drug-eluting stent. We selected a Onyx 2.4825mm x 15mm stent. This was deployed with 7 atmosphere inflation. Balloon was deflated and withdrawn. Repeat angiogram was performed. TICI 3 was achieved. Wires and catheters were removed. The distal access catheter was withdrawn to the cervical ICA and repeat angiogram was performed of the cervical segment. The 088 catheter was then withdrawn. The skin at the puncture site was then cleaned with Chlorhexidine. The 8 French sheath was removed and an 49F angioseal was deployed. Flat panel CT was performed. Patient tolerated the procedure well and remained hemodynamically stable throughout. No complications were encountered and no significant blood loss encountered. IMPRESSION: Status post ultrasound guided access right common femoral artery for cervical and cerebral angiogram, mechanical thrombectomy of right M1 occlusion, and rescue stenting of native intracranial atherosclerotic permissive lesion with 2.25 mm x 15 mm drug-eluting stent, restoring TICI 3 flow. Angio-Seal for hemostasis. Signed, Yvone NeuJaime S. Reyne DumasWagner, DO, RPVI [facility] PLAN: The patient will remain intubated. ICU status Target systolic blood pressure of 120-140 Right hip straight time 6 hours Frequent neurovascular checks Repeat neurologic imaging with CT and/MRI at the discretion of neurology team Dual anti-platelet therapy. Electronically Signed   By: Gilmer MorJaime  Wagner D.O.   On: 10/30/2019 20:58   IR US Guide Vasc Access Right  Result Date: 10/30/2019 INDICATION: 74 year old male with acute stroke, right MCA M1 occlusion EXAM: ULTRASOUND GUIDED ACCESS RIGHT COMMON FEMORAL ARTERY CERVICAL AND CEREBRAL ANGIOGRAM MECHANICAL THROMBECTOMY RIGHT MCA/M1 RESCUE STENT OF RIGHT MCA INTRACRANIAL ATHEROSCLEROSIS ANGIO-SEAL FOR CLOSURE COMPARISON:  CT imaging same day MEDICATIONS: 3 mg intra arterial integral and, 650 mg aspirin OG, 300 mg Plavix OG ANESTHESIA/SEDATION: The anesthesia team was present to provide general endotracheal tube anesthesia and for patient monitoring during the procedure. Intubation was performed in negative pressure [place] in neuro IR holding. Interventional neuro radiology nursing staff was also present. CONTRAST:  170 cc Omnipaque 300 FLUOROSCOPY TIME:  Fluoroscopy Time: 40 minutes 42 seconds (3,316 mGy). COMPLICATIONS: None TECHNIQUE: Informed written consent was obtained from the patient's family after a thorough discussion of the procedural risks, benefits and alternatives. Specific risks discussed include: Bleeding, infection, contrast reaction, kidney injury/failure, need for further procedure/surgery, arterial injury or dissection, embolization to new territory, intracranial hemorrhage (10-15% risk), neurologic deterioration, cardiopulmonary collapse, death. All questions  were addressed. Maximal Sterile Barrier Technique was utilized including during the procedure including caps, mask, sterile gowns, sterile gloves, sterile drape, hand hygiene and skin antiseptic. A timeout was performed prior to the  initiation of the procedure. The anesthesia team was present to provide general endotracheal tube anesthesia and for patient monitoring during the procedure. Interventional neuro radiology nursing staff was also present. FINDINGS: Initial Findings: Right internal carotid artery: Tortuosity of the cervical segment. Vertical and petrous segment patent with normal course caliber contour. Cavernous segment patent. Clinoid segment patent. Antegrade flow of the ophthalmic artery. Ophthalmic segment patent. Terminus patent. Right MCA: Proximal M1 segment is patent. There is a tapered cuff-off of the mid segment of the M1, in the region the mid orbit. Proximal temporal branch remains patent. TICI 0 flow through the M1 segment. There late filling with collaterals via the right anterior cerebral artery and the right posterior cerebral artery via leptomeningeal collateral vessels. Patent anterior communicating artery with the temporal region supplied by the PCA. Right ACA: A 1 segment patent. A 2 segment perfuses the right territory. Patent anterior communicating artery with significant cross-filling of the left anterior cerebral artery. Completion Findings: During the mechanical thrombectomy, the initial 2 passes resulted in TICI 2B flow, with partial filling of greater than 50% of the right-sided hemispheric MCA branches. The cortical branches after the first 2 passes are filling both from leptomeningeal collaterals as well as through the attenuated M1 segment. The final pass with the EMBO trapped device uncovered atherosclerotic plaque at the distal M1 segment. This atherosclerotic plaque was then treated with drug-eluting balloon mounted stent. Right MCA: TICI 3 flow was restored through the right hemisphere. The stent was successful in restoring flow through the disease segment of the M1 Right common carotid artery:  Normal course caliber and contour. Right external carotid artery: Patent with antegrade flow. Flat panel  CT demonstrates minimal staining within the supra ganglionic right MCA territory with no subarachnoid hemorrhage or large parenchymal hemorrhage. PROCEDURE: Patient was brought to the [facility] suite, and identity was confirmed. Patient was prepped and draped in the usual sterile fashion. Ultrasound survey of the right inguinal region was performed with images stored and sent to PACs. 11 blade scalpel was used to make a small incision. Blunt dissection was performed with US guidance. A micropuncture needle was used access the right common femoral artery under ultrasound. With excellent arterial blood flow returned, an .018 micro wire was passed through the needle, observed to enter the abdominal aorta under fluoroscopy. The needle was removed, and a micropuncture sheath was placed over the wire. The inner dilator and wire were removed, and an 035 wire was advanced under fluoroscopy into the abdominal aorta. The sheath was removed and a 25cm 1F straight vascular sheath was placed. The dilator was removed and the sheath was flushed. Limited angiogram was performed. Sheath was attached to pressurized and heparinized saline bag for constant forward flow. A coaxial system was then advanced over the 035 wire. This included a 110 cm Zoom 088 catheter with coaxial 125cm Berenstein diagnostic catheter. This was advanced to the proximal descending thoracic aorta. Wire was then removed. Double flush of the catheter was performed. Catheter was then used to select the innominate artery and the common carotid artery. The catheter combination was then navigated into the cervical ICA segment. The Berenstein catheter and the Glidewire were removed. Angiogram was performed. Road map function was used once the occluded vessel was identified. Copious back flush was performed and  the 088 catheter was attached to heparinized and pressurized saline bag for forward flow. A second coaxial system was then advanced through the balloon catheter,  which included the selected intermediate catheter, microcatheter, and microwire. In this scenario, the set up included a 137 cm 071 Zoom catheter, a Trevo Provue18 microcatheter, and 014 synchro soft wire. This system was advanced through the guide catheter under the road-map function, with adequate back-flush at the rotating hemostatic valve at that back end of the balloon guide. Microcatheter and the intermediate catheter system were advanced through the terminal ICA and MCA to the level of the occlusion. The micro wire in the microcatheter were then removed. These 071 Zoom catheter was then advanced to the face of the occlusion for the first pass attempt, adapt technique. Catheter was withdrawn while at the same time the 088 distal access catheter was advanced through the carotid siphon to the terminus. Angiogram confirmed persistent occlusion. The 071 catheter was then readvanced through the distal access catheter with a coaxial microwire and microcatheter. Microcatheter and the microwire were then gently navigated through the occlusion into angular branch of the MCA. Microcatheter was advanced through the occlusion. Wire was removed. Blood was then aspirated through the hub of the microcatheter, and a gentle contrast injection was performed confirming intraluminal position. A rotating hemostatic valve was then attached to the back end of the microcatheter, and a pressurized and heparinized saline bag was attached to the catheter. 4 x 40 solitaire device was then selected. Back flush was achieved at the rotating hemostatic valve, and then the device was gently advanced through the microcatheter to the distal end. The retriever was then unsheathed by withdrawing the microcatheter under fluoroscopy. Once the retriever was completely unsheathed, the microcatheter was carefully stripped from the delivery device. Control angiogram was performed from the intermediate catheter. A 3 minute time interval was observed.  Solitaire stent was then withdrawn to the 071 catheter under fluoroscopy. Once the retriever was "corked" within the tip of the intermediate catheter, both were removed from the system. Free aspiration was confirmed at the hub of the 088 distal access catheter, with free blood return confirmed. Control angiogram was performed. Partial occlusion remained with TICI 2b flow at this point. We then advanced the zoom catheter with the microwire and microcatheter combination for a third pass attempt, using the adapt technique. 071 catheter was gently advanced to the face of the clot, and aspiration was performed. Once the catheter was withdrawn into the base 088 distal guide catheter, aspiration was performed at the hub of the 088 catheter. Angiogram was performed. Partial occlusion remained with TICI 2b flow at this point. We then proceeded with a fourth pass. The 071 catheter was then readvanced through the distal access catheter with a coaxial microwire and microcatheter. Microcatheter and the microwire were then gently navigated to the face of the occlusion. The synchro soft wire would not advance through the occlusion. A headliner J wire 012 was then used through the microcatheter, successful in passing through the occlusion into the angular branch. Microcatheter was advanced through the occlusion. Wire was removed. Blood was then aspirated through the hub of the microcatheter, and a gentle contrast injection was performed confirming intraluminal position. A rotating hemostatic valve was then attached to the back end of the microcatheter, and a pressurized and heparinized saline bag was attached to the catheter. 5 mm by 37 mm EMBO trapped was then selected. Back flush was achieved at the rotating hemostatic valve, and then the  device was gently advanced through the microcatheter to the distal end. The retriever was then unsheathed by withdrawing the microcatheter under fluoroscopy. Once the retriever was completely  unsheathed, the microcatheter was carefully stripped from the delivery device. Control angiogram was performed from the intermediate catheter. This control angiogram confirmed flow through the EMBO trap, with a lesion at the site of the occlusion, representing native intracranial atherosclerosis. While the stent tree vert remained open for proximally 5 minutes, we had the anesthesia team pass and OG tube. We administered 650 mg of aspirin. Estimation of the diameter length of the lesion were performed. We selected a drug-eluting balloon mounted stent, 2.25 mm x 15 mm. After approximately 5 minutes, the stent tree vert was removed. Angiogram was performed confirming TICI TB flow, relatively unchanged. We then navigated the microcatheter and the microwire/synchro wire back through the 088 distal access catheter. This combination was successful in crossing the stenosis/lesion in the distal M1. The catheter micro wire were placed into the angular branch and the wire was removed. Gentle injection confirmed location. A transcend wire was then placed through the microcatheter and the microcatheter was removed. A baseline angiogram was performed. We then deployed a balloon mounted drug-eluting stent. We selected a Onyx 2.27mm x 15mm stent. This was deployed with 7 atmosphere inflation. Balloon was deflated and withdrawn. Repeat angiogram was performed. TICI 3 was achieved. Wires and catheters were removed. The distal access catheter was withdrawn to the cervical ICA and repeat angiogram was performed of the cervical segment. The 088 catheter was then withdrawn. The skin at the puncture site was then cleaned with Chlorhexidine. The 8 French sheath was removed and an 87F angioseal was deployed. Flat panel CT was performed. Patient tolerated the procedure well and remained hemodynamically stable throughout. No complications were encountered and no significant blood loss encountered. IMPRESSION: Status post ultrasound guided  access right common femoral artery for cervical and cerebral angiogram, mechanical thrombectomy of right M1 occlusion, and rescue stenting of native intracranial atherosclerotic permissive lesion with 2.25 mm x 15 mm drug-eluting stent, restoring TICI 3 flow. Angio-Seal for hemostasis. Signed, Yvone Neu. Reyne Dumas, RPVI [facility] Radiology PLAN: The patient will remain intubated. ICU status Target systolic blood pressure of 120-140 Right hip straight time 6 hours Frequent neurovascular checks Repeat neurologic imaging with CT and/MRI at the discretion of neurology team Dual anti-platelet therapy. Electronically Signed   By: Gilmer Mor D.O.   On: 10/30/2019 20:58   DG CHEST PORT 1 VIEW  Result Date: 10/31/2019 CLINICAL DATA:  CVA. EXAM: PORTABLE CHEST 1 VIEW COMPARISON:  None. FINDINGS: The endotracheal tube is 5.5 cm above the carina. The NG tube is coursing down the esophagus and into the stomach. The cardiac silhouette, mediastinal and hilar contours are within normal limits given the AP projection and portable technique. No acute pulmonary findings. No pleural effusions or pneumothorax. The bony thorax is intact. IMPRESSION: No acute cardiopulmonary findings. Electronically Signed   By: Rudie Meyer M.D.   On: 10/31/2019 06:35   EEG adult  Result Date: 10/31/2019 Charlsie Quest, MD     10/31/2019  5:30 PM Patient Name: Jeffery Reilly MRN: [id number] Epilepsy Attending: Charlsie Quest Referring Physician/Provider: Dr Marvel Plan Date: 10/31/2019 Duration: 23.40 mins Patient history: 74 y.o. male with history of L MCA stroke with resultant R sided weakness and HTN, presented with transient recurrent episodes of aphasia and automatisms of R arm with increased tone in all extremities. EEG  to evaluate for seizure. Level of alertness: Awake AEDs during EEG study: Propofol Technical aspects: This EEG study was done with scalp electrodes positioned according  to the 10-20 International system of electrode placement. Electrical activity was acquired at a sampling rate of 500Hz  and reviewed with a high frequency filter of 70Hz  and a low frequency filter of 1Hz . EEG data were recorded continuously and digitally stored. Description:  No clear posterior dominant rhythm was seen. EEG showed continuous generalized 5-8 Hz theta-alpha activity.  Hyperventilation and photic stimulation were not performed.   ABNORMALITY -Continuous slow, generalized IMPRESSION: This study is suggestive of mild diffuse encephalopathy, nonspecific etiology. No seizures or epileptiform discharges were seen throughout the recording. Priyanka O Yadav   IR PERCUTANEOUS ART THROMBECTOMY/INFUSION INTRACRANIAL INC DIAG ANGIO  Result Date: 10/30/2019 INDICATION: 74 year old male with acute stroke, right MCA M1 occlusion EXAM: ULTRASOUND GUIDED ACCESS RIGHT COMMON FEMORAL ARTERY CERVICAL AND CEREBRAL ANGIOGRAM MECHANICAL THROMBECTOMY RIGHT MCA/M1 RESCUE STENT OF RIGHT MCA INTRACRANIAL ATHEROSCLEROSIS ANGIO-SEAL FOR CLOSURE COMPARISON:  CT imaging same day MEDICATIONS: 3 mg intra arterial integral and, 650 mg aspirin OG, 300 mg Plavix OG ANESTHESIA/SEDATION: The anesthesia team was present to provide general endotracheal tube anesthesia and for patient monitoring during the procedure. Intubation was performed in negative pressure [place] in neuro IR holding. Interventional neuro radiology nursing staff was also present. CONTRAST:  170 cc Omnipaque 300 FLUOROSCOPY TIME:  Fluoroscopy Time: 40 minutes 42 seconds (3,316 mGy). COMPLICATIONS: None TECHNIQUE: Informed written consent was obtained from the patient's family after a thorough discussion of the procedural risks, benefits and alternatives. Specific risks discussed include: Bleeding, infection, contrast reaction, kidney injury/failure, need for further procedure/surgery, arterial injury or dissection, embolization to new territory, intracranial hemorrhage  (10-15% risk), neurologic deterioration, cardiopulmonary collapse, death. All questions were addressed. Maximal Sterile Barrier Technique was utilized including during the procedure including caps, mask, sterile gowns, sterile gloves, sterile drape, hand hygiene and skin antiseptic. A timeout was performed prior to the initiation of the procedure. The anesthesia team was present to provide general endotracheal tube anesthesia and for patient monitoring during the procedure. Interventional neuro radiology nursing staff was also present. FINDINGS: Initial Findings: Right internal carotid artery: Tortuosity of the cervical segment. Vertical and petrous segment patent with normal course caliber contour. Cavernous segment patent. Clinoid segment patent. Antegrade flow of the ophthalmic artery. Ophthalmic segment patent. Terminus patent. Right MCA: Proximal M1 segment is patent. There is a tapered cuff-off of the mid segment of the M1, in the region the mid orbit. Proximal temporal branch remains patent. TICI 0 flow through the M1 segment. There late filling with collaterals via the right anterior cerebral artery and the right posterior cerebral artery via leptomeningeal collateral vessels. Patent anterior communicating artery with the temporal region supplied by the PCA. Right ACA: A 1 segment patent. A 2 segment perfuses the right territory. Patent anterior communicating artery with significant cross-filling of the left anterior cerebral artery. Completion Findings: During the mechanical thrombectomy, the initial 2 passes resulted in TICI 2B flow, with partial filling of greater than 50% of the right-sided hemispheric MCA branches. The cortical branches after the first 2 passes are filling both from leptomeningeal collaterals as well as through the attenuated M1 segment. The final pass with the EMBO trapped device uncovered atherosclerotic plaque at the distal M1 segment. This atherosclerotic plaque was then treated  with drug-eluting balloon mounted stent. Right MCA: TICI 3 flow was restored through the right hemisphere. The stent was successful in restoring flow  through the disease segment of the M1 Right common carotid artery:  Normal course caliber and contour. Right external carotid artery: Patent with antegrade flow. Flat panel CT demonstrates minimal staining within the supra ganglionic right MCA territory with no subarachnoid hemorrhage or large parenchymal hemorrhage. PROCEDURE: Patient was brought to the [facility] suite, and identity was confirmed. Patient was prepped and draped in the usual sterile fashion. Ultrasound survey of the right inguinal region was performed with images stored and sent to PACs. 11 blade scalpel was used to make a small incision. Blunt dissection was performed with US guidance. A micropuncture needle was used access the right common femoral artery under ultrasound. With excellent arterial blood flow returned, an .018 micro wire was passed through the needle, observed to enter the abdominal aorta under fluoroscopy. The needle was removed, and a micropuncture sheath was placed over the wire. The inner dilator and wire were removed, and an 035 wire was advanced under fluoroscopy into the abdominal aorta. The sheath was removed and a 25cm 61F straight vascular sheath was placed. The dilator was removed and the sheath was flushed. Limited angiogram was performed. Sheath was attached to pressurized and heparinized saline bag for constant forward flow. A coaxial system was then advanced over the 035 wire. This included a 110 cm Zoom 088 catheter with coaxial 125cm Berenstein diagnostic catheter. This was advanced to the proximal descending thoracic aorta. Wire was then removed. Double flush of the catheter was performed. Catheter was then used to select the innominate artery and the common carotid artery. The catheter combination was then navigated into the cervical ICA segment. The Berenstein catheter and  the Glidewire were removed. Angiogram was performed. Road map function was used once the occluded vessel was identified. Copious back flush was performed and the 088 catheter was attached to heparinized and pressurized saline bag for forward flow. A second coaxial system was then advanced through the balloon catheter, which included the selected intermediate catheter, microcatheter, and microwire. In this scenario, the set up included a 137 cm 071 Zoom catheter, a Trevo Provue18 microcatheter, and 014 synchro soft wire. This system was advanced through the guide catheter under the road-map function, with adequate back-flush at the rotating hemostatic valve at that back end of the balloon guide. Microcatheter and the intermediate catheter system were advanced through the terminal ICA and MCA to the level of the occlusion. The micro wire in the microcatheter were then removed. These 071 Zoom catheter was then advanced to the face of the occlusion for the first pass attempt, adapt technique. Catheter was withdrawn while at the same time the 088 distal access catheter was advanced through the carotid siphon to the terminus. Angiogram confirmed persistent occlusion. The 071 catheter was then readvanced through the distal access catheter with a coaxial microwire and microcatheter. Microcatheter and the microwire were then gently navigated through the occlusion into angular branch of the MCA. Microcatheter was advanced through the occlusion. Wire was removed. Blood was then aspirated through the hub of the microcatheter, and a gentle contrast injection was performed confirming intraluminal position. A rotating hemostatic valve was then attached to the back end of the microcatheter, and a pressurized and heparinized saline bag was attached to the catheter. 4 x 40 solitaire device was then selected. Back flush was achieved at the rotating hemostatic valve, and then the device was gently advanced through the microcatheter to  the distal end. The retriever was then unsheathed by withdrawing the microcatheter under fluoroscopy. Once the retriever  was completely unsheathed, the microcatheter was carefully stripped from the delivery device. Control angiogram was performed from the intermediate catheter. A 3 minute time interval was observed. Solitaire stent was then withdrawn to the 071 catheter under fluoroscopy. Once the retriever was "corked" within the tip of the intermediate catheter, both were removed from the system. Free aspiration was confirmed at the hub of the 088 distal access catheter, with free blood return confirmed. Control angiogram was performed. Partial occlusion remained with TICI 2b flow at this point. We then advanced the zoom catheter with the microwire and microcatheter combination for a third pass attempt, using the adapt technique. 071 catheter was gently advanced to the face of the clot, and aspiration was performed. Once the catheter was withdrawn into the base 088 distal guide catheter, aspiration was performed at the hub of the 088 catheter. Angiogram was performed. Partial occlusion remained with TICI 2b flow at this point. We then proceeded with a fourth pass. The 071 catheter was then readvanced through the distal access catheter with a coaxial microwire and microcatheter. Microcatheter and the microwire were then gently navigated to the face of the occlusion. The synchro soft wire would not advance through the occlusion. A headliner J wire 012 was then used through the microcatheter, successful in passing through the occlusion into the angular branch. Microcatheter was advanced through the occlusion. Wire was removed. Blood was then aspirated through the hub of the microcatheter, and a gentle contrast injection was performed confirming intraluminal position. A rotating hemostatic valve was then attached to the back end of the microcatheter, and a pressurized and heparinized saline bag was attached to the  catheter. 5 mm by 37 mm EMBO trapped was then selected. Back flush was achieved at the rotating hemostatic valve, and then the device was gently advanced through the microcatheter to the distal end. The retriever was then unsheathed by withdrawing the microcatheter under fluoroscopy. Once the retriever was completely unsheathed, the microcatheter was carefully stripped from the delivery device. Control angiogram was performed from the intermediate catheter. This control angiogram confirmed flow through the EMBO trap, with a lesion at the site of the occlusion, representing native intracranial atherosclerosis. While the stent tree vert remained open for proximally 5 minutes, we had the anesthesia team pass and OG tube. We administered 650 mg of aspirin. Estimation of the diameter length of the lesion were performed. We selected a drug-eluting balloon mounted stent, 2.25 mm x 15 mm. After approximately 5 minutes, the stent tree vert was removed. Angiogram was performed confirming TICI TB flow, relatively unchanged. We then navigated the microcatheter and the microwire/synchro wire back through the 088 distal access catheter. This combination was successful in crossing the stenosis/lesion in the distal M1. The catheter micro wire were placed into the angular branch and the wire was removed. Gentle injection confirmed location. A transcend wire was then placed through the microcatheter and the microcatheter was removed. A baseline angiogram was performed. We then deployed a balloon mounted drug-eluting stent. We selected a Onyx 2.70mm x 15mm stent. This was deployed with 7 atmosphere inflation. Balloon was deflated and withdrawn. Repeat angiogram was performed. TICI 3 was achieved. Wires and catheters were removed. The distal access catheter was withdrawn to the cervical ICA and repeat angiogram was performed of the cervical segment. The 088 catheter was then withdrawn. The skin at the puncture site was then cleaned  with Chlorhexidine. The 8 French sheath was removed and an 11F angioseal was deployed. Flat panel CT was  performed. Patient tolerated the procedure well and remained hemodynamically stable throughout. No complications were encountered and no significant blood loss encountered. IMPRESSION: Status post ultrasound guided access right common femoral artery for cervical and cerebral angiogram, mechanical thrombectomy of right M1 occlusion, and rescue stenting of native intracranial atherosclerotic permissive lesion with 2.25 mm x 15 mm drug-eluting stent, restoring TICI 3 flow. Angio-Seal for hemostasis. Signed, Yvone Neu. Reyne Dumas, RPVI [facility] Radiology PLAN: The patient will remain intubated. ICU status Target systolic blood pressure of 120-140 Right hip straight time 6 hours Frequent neurovascular checks Repeat neurologic imaging with CT and/MRI at the discretion of neurology team Dual anti-platelet therapy. Electronically Signed   By: Gilmer Mor D.O.   On: 10/30/2019 20:58   CT HEAD CODE STROKE WO CONTRAST`  Addendum Date: 10/30/2019   ADDENDUM REPORT: 10/30/2019 15:38 ADDENDUM: Study discussed by telephone with Dr. Chesley Noon on 10/30/2019 at 1532 hours. Electronically Signed   By: Odessa Fleming M.D.   On: 10/30/2019 15:38   Result Date: 10/30/2019 CLINICAL DATA:  Code stroke. 74 year old male last known well 1100 hours. Right gaze deviation and aphasia. EXAM: CT HEAD WITHOUT CONTRAST TECHNIQUE: Contiguous axial images were obtained from the base of the skull through the vertex without intravenous contrast. COMPARISON:  Head CT 03/21/2018. FINDINGS: Brain: Stable cerebral volume. No midline shift, mass effect, or evidence of intracranial mass lesion. No ventriculomegaly. No acute intracranial hemorrhage identified. Advanced chronic small vessel ischemia in the bilateral corona radiata and deep gray nuclei, with Patchy and confluent bilateral hypodensity more  so on the left. Some associated ex vacuo enlargement of the left lateral ventricle. But gray-white matter differentiation appears stable since 2019. No cortically based acute infarct identified. No cortical encephalomalacia identified. Vascular: Mild Calcified atherosclerosis at the skull base. No suspicious intracranial vascular hyperdensity. Skull: Stable, negative. Sinuses/Orbits: Chronic bubbly opacity in the left sphenoid. Other Visualized paranasal sinuses and mastoids are stable and well pneumatized. Other: Rightward gaze deviation. No acute scalp soft tissue finding. ASPECTS [facility] Stroke Program Early CT Score) Total score (0-10 with 10 being normal): 10 (chronic encephalomalacia). IMPRESSION: 1. Advanced chronic small vessel disease appears stable by CT since 2019. 2. No acute cortically based infarct or acute intracranial hemorrhage identified. ASPECTS 10. Electronically Signed: By: Odessa Fleming M.D. 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  1. Does the need for close, 24 hr/day medical supervision in concert with the patient's rehab needs make it unreasonable for this patient to be served in a less intensive setting? Yes 2. Co-Morbidities requiring supervision/potential complications: HTN, previous stroke, dysarthria, apraxia, dysphagia 3. Due to bladder management, bowel management, safety, skin/wound care, disease management, medication administration and patient education, does the patient require 24 hr/day rehab nursing? Yes 4. Does the patient require coordinated care of a physician, rehab nurse, therapy disciplines of PT< OT, and SLP to address physical and functional deficits in the context of the above medical diagnosis(es)? Yes Addressing deficits in the following areas: balance, endurance, locomotion, strength, transferring, bathing, dressing, feeding, grooming,  toileting, cognition, speech and swallowing 5. Can the patient actively participate in an intensive therapy program of at least 3 hrs of therapy per day at least 5 days per week? Yes 6. The potential for patient to make measurable gains while on inpatient rehab is good 7. Anticipated functional outcomes upon discharge from inpatient rehab  are supervision and min assist  with PT, supervision and min assist with OT, supervision and min assist with SLP. 8. Estimated rehab length of stay to reach the above functional goals is: 2-2/5 weeks 9. Anticipated discharge destination: Home 10. Overall Rehab/Functional Prognosis: good  RECOMMENDATIONS: This patient's condition is appropriate for continued rehabilitative care in the following setting: CIR Patient has agreed to participate in recommended program. Potentially Note that insurance prior authorization may be required for reimbursement for recommended care.  Comment:  1. Pt reports he's very hungry- if unable to feed him, suggest D5 NS so he can get some nutrition.  2. Pt has apraxia- verbal and oral apraxia- will make it harder to eat/speak- needs SLP aggressively. Also has mild neglect- set up room to compensate. 3. Suggest helping pt have BM if possible.  4. Pt is an appropriate candidate for inpt Rehab- CIR- will submit for approval and have admissions coordinators speak with pt.  5. Thank you for this consult.  6. Will follow remotely until ready for CIR.   Mcarthur Rossetti Angiulli, PA-C 11/01/2019    I have personally performed a face to face diagnostic evaluation of this patient and formulated the key components of the plan.  Additionally, I have personally reviewed laboratory data, imaging studies, as well as relevant notes and concur with the physician assistant's documentation above.

## 2019-11-02 ENCOUNTER — Inpatient Hospital Stay (HOSPITAL_COMMUNITY): Payer: Medicare Other

## 2019-11-02 DIAGNOSIS — E1159 Type 2 diabetes mellitus with other circulatory complications: Secondary | ICD-10-CM

## 2019-11-02 LAB — CBC
HCT: 42.4 % (ref 39.0–52.0)
Hemoglobin: 13.1 g/dL (ref 13.0–17.0)
MCH: 27.2 pg (ref 26.0–34.0)
MCHC: 30.9 g/dL (ref 30.0–36.0)
MCV: 88.1 fL (ref 80.0–100.0)
Platelets: 150 10*3/uL (ref 150–400)
RBC: 4.81 MIL/uL (ref 4.22–5.81)
RDW: 13.6 % (ref 11.5–15.5)
WBC: 7 10*3/uL (ref 4.0–10.5)
nRBC: 0 % (ref 0.0–0.2)

## 2019-11-02 LAB — GLUCOSE, CAPILLARY
Glucose-Capillary: 108 mg/dL — ABNORMAL HIGH (ref 70–99)
Glucose-Capillary: 96 mg/dL (ref 70–99)
Glucose-Capillary: 118 mg/dL — ABNORMAL HIGH (ref 70–99)
Glucose-Capillary: 119 mg/dL — ABNORMAL HIGH (ref 70–99)
Glucose-Capillary: 107 mg/dL — ABNORMAL HIGH (ref 70–99)

## 2019-11-02 LAB — BASIC METABOLIC PANEL
Anion gap: 10 (ref 5–15)
BUN: 10 mg/dL (ref 8–23)
CO2: 24 mmol/L (ref 22–32)
Calcium: 8.7 mg/dL — ABNORMAL LOW (ref 8.9–10.3)
Chloride: 107 mmol/L (ref 98–111)
Creatinine, Ser: 0.86 mg/dL (ref 0.61–1.24)
GFR calc Af Amer: >60 mL/min (ref >60)
GFR calc non Af Amer: >60 mL/min (ref >60)
Glucose, Bld: 110 mg/dL — ABNORMAL HIGH (ref 70–99)
Potassium: 4 mmol/L (ref 3.5–5.1)
Sodium: 141 mmol/L (ref 135–145)

## 2019-11-02 MED ORDER — LISINOPRIL 20 MG PO TABS
20.0000 mg | ORAL_TABLET | Freq: Every day | ORAL | Status: DC
  Administered 2019-11-03: 20 mg via ORAL
  Filled 2019-11-02: qty 1

## 2019-11-02 NOTE — Progress Notes (Signed)
Modified Barium Swallow Progress Note  Patient Details  Name: Jeffery Reilly MRN: [id number] Date of Birth: Mar 24, 1946  Today's Date: 11/02/2019  Modified Barium Swallow completed.  Full report located under Chart Review in the Imaging Section.  Brief recommendations include the following:  Clinical Impression  Pt presents with a moderate oral phase dysphagia and a normal pharyngeal phase with excellent pharyngeal clearance and no penetration/aspiration, even when taxed with mixed solid/liquid consistencies.  Oral phase was marked by significant incoordination and poor bolus incohesion, anterior loss, residue and piecemeal swallowing.  Recommend advancing diet to dysphagia 2 with thin liquids; supervise to cue for pocketing/residue in oral cavity and anterior spillage.  Pills may be given whole in puree.  Pt is potentially D/Cing to CIR next date.    Swallow Evaluation Recommendations       SLP Diet Recommendations: Thin liquid;Dysphagia 2 (Fine chop) solids   Liquid Administration via: Cup;Straw   Medication Administration: Whole meds with puree   Supervision: Patient able to self feed;Full supervision/cueing for compensatory strategies   Compensations: Slow rate;Small sips/bites   Postural Changes: Remain semi-upright after after feeds/meals (Comment)   Oral Care Recommendations: Oral care BID   Other Recommendations: Have oral suction available   Tameyah Koch L. Samson Frederic, MA CCC/SLP Acute Rehabilitation Services Office number [phone] Pager [phone]  Blenda Mounts Laurice 11/02/2019,4:31 PM

## 2019-11-02 NOTE — Progress Notes (Signed)
Late entry: Note populated for Harlon Ditty, SLP  Mahala Menghini., M.A. CCC-SLP Acute Rehabilitation Services Pager [phone] Office [phone]     11/01/19 1500  SLP Visit Information  SLP Received On 11/01/19  SLP Time Calculation  SLP Start Time (ACUTE ONLY) 1400  SLP Stop Time (ACUTE ONLY) 1430  SLP Time Calculation (min) (ACUTE ONLY) 30 min  General Information  HPI 74 y.o. 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 Pt with aphasia, R gaze deviation and L weakness with possible neglect. CTA head/neck demonstrating R MCA occlusion. Pt underwent R MCA stenting on 5/18, intubated until 5/19.   Prior Functional Status  Cognitive/Linguistic Baseline Baseline deficits  Baseline deficit details unclear  Type of Home House   Lives With Spouse  Available Help at Discharge Family;Available 24 hours/day  Vocation Retired  Land Function  Overall Oral Motor/Sensory Function Severe impairment  Facial ROM Reduced right;Suspected CN VII (facial) dysfunction  Facial Symmetry Abnormal symmetry right;Suspected CN VII (facial) dysfunction  Facial Strength Reduced right;Suspected CN VII (facial) dysfunction  Lingual ROM Reduced right;Suspected CN XII (hypoglossal) dysfunction  Lingual Symmetry Abnormal symmetry right;Suspected CN XII (hypoglossal) dysfunction  Lingual Strength Reduced;Suspected CN XII (hypoglossal) dysfunction  Cognition  Overall Cognitive Status Impaired/Different from baseline  Arousal/Alertness Awake/alert  Attention Focused;Sustained;Selective  Focused Attention Appears intact  Sustained Attention Appears intact  Selective Attention Impaired  Selective Attention Impairment Verbal basic;Functional basic  Memory  (not tested)  Awareness Impaired  Awareness Impairment Emergent impairment  Problem Solving Impaired  Problem Solving  Impairment Functional basic  Executive Function Self Monitoring  Self Monitoring Impaired  Self Monitoring Impairment Verbal basic;Functional basic  Auditory Comprehension  Overall Auditory Comprehension Appears within functional limits for tasks assessed  Verbal Expression  Overall Verbal Expression Appears within functional limits for tasks assessed  Motor Speech  Overall Motor Speech Impaired  Respiration WFL  Phonation Wet  Resonance WFL  Articulation Impaired  Level of Impairment Word  Intelligibility Intelligibility reduced  Word 50-74% accurate  Phrase 50-74% accurate  Sentence 0-24% accurate  Conversation 0-24% accurate  Assessment  Clinical Impression Statement (ACUTE ONLY) Pt presents with a moderate dysarthria, left inattention, decreased awareness and disorintation. Pts speech at conversation level is less than 50% intelligible with right lingual deviation. He also has hesitationa nd sound repetitions at times. These deficits may have resulted from pts prior CVA as he reports some difficulty talking and that many people do not understand him, including his wife. He is able to generally converse about recent events, but he is disoreinted to time and is poorly aware of physical deficits. Langauge comprehension and expression is relatively in tact. Will follow for speech intelligiblity and cognition.   SLP Recommendation/Assessment Patient needs continued Speech Lanaguage Pathology Services  SLP Visit Diagnosis Dysarthria and anarthria (R47.1)  Problem List Orientation;Attention;Problem Solving;Verbal expression  Plan  Speech Therapy Frequency (ACUTE ONLY) min 2x/week  Duration 2 weeks  Treatment/Interventions Compensatory strategies;SLP instruction and feedback;Functional tasks  Potential to Achieve Goals (ACUTE ONLY) Good  Potential Considerations (ACUTE ONLY) Severity of impairments;Previous level of function  SLP Recommendations  Follow up Recommendations Inpatient Rehab   Individuals Consulted  Consulted and Agree with Results and Recommendations Patient  SLP Evaluations  $ SLP Speech Visit 1 Visit  SLP Evaluations  $ SLP EVAL LANGUAGE/SOUND PRODUCTION 1 Procedure  $Speech Treatment for Individual 1 Procedure

## 2019-11-02 NOTE — Progress Notes (Signed)
Inpatient Rehab Admissions:  Inpatient Rehab Consult received.  I met with patient at the bedside for rehabilitation assessment and to discuss goals and expectations of an inpatient rehab admission.  I was also able to speak to his wife over the phone.  Both are hopeful for CIR and family is able to provide 24/7 supervision at discharge, if needed.  Pt for MBSS today.  Per Dr. Erlinda Hong, plan for admit tomorrow, Saturday, 5/22.  I will let pt/family and TOC team know.  Dr. Dagoberto Ligas will evaluate pt in the AM.  Floor RN can call for report after noon to [phone].    Signed: Shann Medal, PT, DPT Admissions Coordinator [phone] 11/02/19  11:41 AM

## 2019-11-02 NOTE — Progress Notes (Signed)
Physical Therapy Treatment Patient Details Name: Jeffery Reilly MRN: [id number] DOB: Sep 23, 1945 Today's Date: 11/02/2019    History of Present Illness 74 y.o. male past medical history of mild residual right hemiparesis from a left cerebral hemispheric stroke  Patient presented to [facility] for confusion/aphasia. Pt with aphasia, R gaze deviation and L weakness with possible neglect. CTA head/neck demonstrating R MCA occlusion. Pt underwent R MCA stenting on 5/18. Pt remains intubated after procedure. and extubated 5/19.     PT Comments    Pt tolerated treatment well with improved activity tolerance and reduced assistance requirements for gait and transfers. Pt continues to demonstrate poor device management and reduced foot clearance with BLE, increasing falls risk. Pt continues to demonstrate limited insight into deficits at this time, further increasing his falls risk. Pt will continue to benefit from aggressive mobilization and PT POC to improve mobility quality and reduce falls risk. PT continues to recommend CIR at this time.   Follow Up Recommendations  CIR;Supervision/Assistance - 24 hour     Equipment Recommendations  Wheelchair (measurements PT);Wheelchair cushion (measurements PT)    Recommendations for Other Services       Precautions / Restrictions Precautions Precautions: Fall Restrictions Weight Bearing Restrictions: No    Mobility  Bed Mobility Overal bed mobility: Needs Assistance Bed Mobility: Supine to Sit;Sit to Supine     Supine to sit: Min assist Sit to supine: Min assist      Transfers Overall transfer level: Needs assistance Equipment used: Rolling walker (2 wheeled) Transfers: Sit to/from Stand Sit to Stand: Min assist;Mod assist         General transfer comment: modA with fatigue. Pt requires cues for hand placement  Ambulation/Gait Ambulation/Gait assistance: Min assist Gait Distance (Feet): 40 Feet(additional 20' and 5') Assistive  device: Rolling walker (2 wheeled) Gait Pattern/deviations: Step-to pattern;Shuffle;Trunk flexed Gait velocity: reduced Gait velocity interpretation: <1.8 ft/sec, indicate of risk for recurrent falls General Gait Details: pt with short shuffling steps despite PT cues for increased step length and foot clearance. PT provides cues to maintain BOS within RW   Stairs             Wheelchair Mobility    Modified Rankin (Stroke Patients Only) Modified Rankin (Stroke Patients Only) Pre-Morbid Rankin Score: Slight disability Modified Rankin: Moderately severe disability     Balance Overall balance assessment: Needs assistance Sitting-balance support: Single extremity supported;Feet supported Sitting balance-Leahy Scale: Fair Sitting balance - Comments: minG at edge of bed   Standing balance support: Bilateral upper extremity supported Standing balance-Leahy Scale: Fair Standing balance comment: minG with BUE support of RW                            Cognition Arousal/Alertness: Awake/alert Behavior During Therapy: WFL for tasks assessed/performed Overall Cognitive Status: Impaired/Different from baseline Area of Impairment: Attention;Memory;Following commands;Safety/judgement;Awareness;Problem solving                   Current Attention Level: Sustained Memory: Decreased recall of precautions;Decreased short-term memory Following Commands: Follows one step commands consistently Safety/Judgement: Decreased awareness of safety;Decreased awareness of deficits Awareness: Intellectual Problem Solving: Slow processing        Exercises      General Comments General comments (skin integrity, edema, etc.): VSS on RA      Pertinent Vitals/Pain Pain Assessment: No/denies pain    Home Living  Prior Function            PT Goals (current goals can now be found in the care plan section) Acute Rehab PT Goals Patient Stated  Goal: to get better and go home to live with his wife Progress towards PT goals: Progressing toward goals    Frequency    Min 4X/week      PT Plan Current plan remains appropriate    Co-evaluation              AM-PAC PT "6 Clicks" Mobility   Outcome Measure  Help needed turning from your back to your side while in a flat bed without using bedrails?: A Little Help needed moving from lying on your back to sitting on the side of a flat bed without using bedrails?: A Little Help needed moving to and from a bed to a chair (including a wheelchair)?: A Little Help needed standing up from a chair using your arms (e.g., wheelchair or bedside chair)?: A Lot Help needed to walk in hospital room?: A Little Help needed climbing 3-5 steps with a railing? : Total 6 Click Score: 15    End of Session Equipment Utilized During Treatment: Gait belt Activity Tolerance: Patient tolerated treatment well Patient left: in bed;with call bell/phone within reach;with bed alarm set Nurse Communication: Mobility status PT Visit Diagnosis: Unsteadiness on feet (R26.81);Other abnormalities of gait and mobility (R26.89);Muscle weakness (generalized) (M62.81);Other symptoms and signs involving the nervous system (P10.258)     Time: 5277-8242 PT Time Calculation (min) (ACUTE ONLY): 40 min  Charges:  $Gait Training: 23-37 mins $Therapeutic Activity: 8-22 mins                     Arlyss Gandy, PT, DPT Acute Rehabilitation Pager: [phone]    Arlyss Gandy 11/02/2019, 10:52 AM

## 2019-11-02 NOTE — Progress Notes (Addendum)
STROKE TEAM PROGRESS NOTE   INTERVAL HISTORY Pt sitting in bed, no complains. Speech much clear than yesterday, pending MBS. If not able to pass, will consider cortrak. He is strong on the left, still has residue right sided weakness and facial droop which is chronic.    Vitals:   11/02/19 0400 11/02/19 0500 11/02/19 0700 11/02/19 0800  BP: 117/74 135/85 127/65 127/79  Pulse:   (!) 58 62  Resp: 20 16 19  (!) 23  Temp: 98.6 F (37 C)   98 F (36.7 C)  TempSrc: Oral   Oral  SpO2: 98% 99% 98% 98%  Height:       CBC:  Recent Labs  Lab 10/30/19 1527 10/30/19 2134 11/01/19 0500 11/02/19 0431  WBC 6.3  --  7.5 7.0  NEUTROABS 3.5  --   --   --   HGB 14.0   < > 12.3* 13.1  HCT 44.0   < > 38.9* 42.4  MCV 85.8  --  87.8 88.1  PLT 179  --  158 150   < > = values in this interval not displayed.   Basic Metabolic Panel:  Recent Labs  Lab 11/01/19 0500 11/02/19 0431  NA 142 141  K 3.6 4.0  CL 111 107  CO2 24 24  GLUCOSE 137* 110*  BUN 8 10  CREATININE 0.80 0.86  CALCIUM 8.4* 8.7*   Lipid Panel:     Component Value Date/Time   CHOL 249 (H) 10/31/2019 0551   TRIG 240 (H) 11/01/2019 0500   HDL 43 10/31/2019 0551   CHOLHDL 5.8 10/31/2019 0551   VLDL 63 (H) 10/31/2019 0551   LDLCALC 143 (H) 10/31/2019 0551   HgbA1c:  Lab Results  Component Value Date   HGBA1C 7.4 (H) 10/31/2019   Urine Drug Screen:     Component Value Date/Time   LABOPIA NONE DETECTED 10/31/2019 0832   COCAINSCRNUR NONE DETECTED 10/31/2019 0832   LABBENZ NONE DETECTED 10/31/2019 0832   AMPHETMU NONE DETECTED 10/31/2019 0832   THCU NONE DETECTED 10/31/2019 0832   LABBARB NONE DETECTED 10/31/2019 0832    Alcohol Level     Component Value Date/Time   ETH <10 10/30/2019 1527    IMAGING past 24 hours ECHOCARDIOGRAM COMPLETE  Result Date: 11/01/2019    ECHOCARDIOGRAM REPORT   Patient Name:   Jeffery Reilly Date of Exam: 11/01/2019 Medical Rec #:  [id number]     Height:       70.0 in Accession #:     [id number]    Weight:       216.3 lb Date of Birth:  20-Jan-1946      BSA:          2.158 m Patient Age:    74 years      BP:           134/65 mmHg Patient Gender: M             HR:           74 bpm. Exam Location:  Inpatient Procedure: 2D Echo, Cardiac Doppler and Color Doppler Indications:    Stroke 434.91 / I163.9  History:        Patient has no prior history of Echocardiogram examinations.                 Risk Factors:Former Smoker.  Sonographer:    Jonelle Sidle Dance Referring Phys: Shannon  1. Left ventricular ejection fraction, by estimation, is 60 to  65%. The left ventricle has normal function. The left ventricle has no regional wall motion abnormalities. Left ventricular diastolic parameters are consistent with Grade I diastolic dysfunction (impaired relaxation).  2. Right ventricular systolic function is normal. The right ventricular size is normal. Tricuspid regurgitation signal is inadequate for assessing PA pressure.  3. The mitral valve is normal in structure. No evidence of mitral valve regurgitation. No evidence of mitral stenosis.  4. The aortic valve is normal in structure. Aortic valve regurgitation is not visualized. No aortic stenosis is present.  5. Aortic dilatation noted. There is mild dilatation of the aortic root measuring 42 mm.  6. The inferior vena cava is normal in size with greater than 50% respiratory variability, suggesting right atrial pressure of 3 mmHg. Conclusion(s)/Recommendation(s): No intracardiac source of embolism detected on this transthoracic study. A transesophageal echocardiogram is recommended to exclude cardiac source of embolism if clinically indicated. FINDINGS  Left Ventricle: Left ventricular ejection fraction, by estimation, is 60 to 65%. The left ventricle has normal function. The left ventricle has no regional wall motion abnormalities. The left ventricular internal cavity size was normal in size. There is  no left ventricular hypertrophy. Left  ventricular diastolic parameters are consistent with Grade I diastolic dysfunction (impaired relaxation). Right Ventricle: The right ventricular size is normal. No increase in right ventricular wall thickness. Right ventricular systolic function is normal. Tricuspid regurgitation signal is inadequate for assessing PA pressure. Left Atrium: Left atrial size was normal in size. Right Atrium: Right atrial size was normal in size. Pericardium: There is no evidence of pericardial effusion. Mitral Valve: The mitral valve is normal in structure. Normal mobility of the mitral valve leaflets. No evidence of mitral valve regurgitation. No evidence of mitral valve stenosis. Tricuspid Valve: The tricuspid valve is normal in structure. Tricuspid valve regurgitation is not demonstrated. No evidence of tricuspid stenosis. Aortic Valve: The aortic valve is normal in structure. Aortic valve regurgitation is not visualized. No aortic stenosis is present. Pulmonic Valve: The pulmonic valve was normal in structure. Pulmonic valve regurgitation is not visualized. No evidence of pulmonic stenosis. Aorta: Aortic dilatation noted. There is mild dilatation of the aortic root measuring 42 mm. Venous: The inferior vena cava is normal in size with greater than 50% respiratory variability, suggesting right atrial pressure of 3 mmHg. IAS/Shunts: No atrial level shunt detected by color flow Doppler.  LEFT VENTRICLE PLAX 2D LVIDd:         4.90 cm  Diastology LVIDs:         3.40 cm  LV e' lateral:   8.59 cm/s LV PW:         1.10 cm  LV E/e' lateral: 10.9 LV IVS:        1.00 cm  LV e' medial:    6.42 cm/s LVOT diam:     2.00 cm  LV E/e' medial:  14.6 LV SV:         73 LV SV Index:   34 LVOT Area:     3.14 cm  RIGHT VENTRICLE             IVC RV Basal diam:  2.10 cm     IVC diam: 1.50 cm RV S prime:     12.10 cm/s TAPSE (M-mode): 1.8 cm LEFT ATRIUM             Index LA diam:        3.50 cm 1.62 cm/m LA Vol (A2C):   43.6 ml 20.21 ml/m  LA Vol (A4C):    22.2 ml 10.26 ml/m LA Biplane Vol: 37.3 ml 17.29 ml/m  AORTIC VALVE LVOT Vmax:   116.00 cm/s LVOT Vmean:  67.200 cm/s LVOT VTI:    0.233 m  AORTA Ao Root diam: 4.20 cm Ao Asc diam:  3.50 cm MITRAL VALVE MV Area (PHT): 3.31 cm     SHUNTS MV Decel Time: 229 msec     Systemic VTI:  0.23 m MV E velocity: 93.60 cm/s   Systemic Diam: 2.00 cm MV A velocity: 107.00 cm/s MV E/A ratio:  0.87 Weston Brass MD Electronically signed by Weston Brass MD Signature Date/Time: 11/01/2019/3:13:10 PM    Final    VAS [place] LOWER EXTREMITY VENOUS (DVT)  Result Date: 11/01/2019  Lower Venous DVTStudy Indications: Stroke.  Comparison Study: no prior Performing Technologist: Blanch Media RVS  Examination Guidelines: A complete evaluation includes B-mode imaging, spectral Doppler, color Doppler, and power Doppler as needed of all accessible portions of each vessel. Bilateral testing is considered an integral part of a complete examination. Limited examinations for reoccurring indications may be performed as noted. The reflux portion of the exam is performed with the patient in reverse Trendelenburg.  +---------+---------------+---------+-----------+----------+--------------+ RIGHT    CompressibilityPhasicitySpontaneityPropertiesThrombus Aging +---------+---------------+---------+-----------+----------+--------------+ CFV      Full           Yes      Yes                                 +---------+---------------+---------+-----------+----------+--------------+ SFJ      Full                                                        +---------+---------------+---------+-----------+----------+--------------+ FV Prox  Full                                                        +---------+---------------+---------+-----------+----------+--------------+ FV Mid   Full                                                        +---------+---------------+---------+-----------+----------+--------------+ FV  DistalFull                                                        +---------+---------------+---------+-----------+----------+--------------+ PFV      Full                                                        +---------+---------------+---------+-----------+----------+--------------+ POP      Full           Yes      Yes                                 +---------+---------------+---------+-----------+----------+--------------+  PTV      Full                                                        +---------+---------------+---------+-----------+----------+--------------+ PERO     Full                                                        +---------+---------------+---------+-----------+----------+--------------+   +---------+---------------+---------+-----------+----------+--------------+ LEFT     CompressibilityPhasicitySpontaneityPropertiesThrombus Aging +---------+---------------+---------+-----------+----------+--------------+ CFV      Full           Yes      Yes                                 +---------+---------------+---------+-----------+----------+--------------+ SFJ      Full                                                        +---------+---------------+---------+-----------+----------+--------------+ FV Prox  Full                                                        +---------+---------------+---------+-----------+----------+--------------+ FV Mid   Full                                                        +---------+---------------+---------+-----------+----------+--------------+ FV DistalFull                                                        +---------+---------------+---------+-----------+----------+--------------+ PFV      Full                                                        +---------+---------------+---------+-----------+----------+--------------+ POP      Full           Yes      Yes                                  +---------+---------------+---------+-----------+----------+--------------+ PTV      Full                                                        +---------+---------------+---------+-----------+----------+--------------+  PERO     Full                                                        +---------+---------------+---------+-----------+----------+--------------+     Summary: BILATERAL: - No evidence of deep vein thrombosis seen in the lower extremities, bilaterally. -   *See table(s) above for measurements and observations. Electronically signed by Sherald Hess MD on 11/01/2019 at 5:24:02 PM.    Final     PHYSICAL EXAM   Temp:  [98 F (36.7 C)-98.7 F (37.1 C)] 98 F (36.7 C) (05/21 0800) Pulse Rate:  [52-133] 62 (05/21 0800) Resp:  [8-25] 23 (05/21 0800) BP: (111-135)/(61-91) 127/79 (05/21 0800) SpO2:  [92 %-100 %] 98 % (05/21 0800)  General - Well nourished, well developed, not in acute distress.  Ophthalmologic - fundi not visualized due to noncooperation.  Cardiovascular - Regular rate and rhythm.  Neuro - awake, alert, eyes open, orientated to place, month and age, not to month. Following simple commands. Moderate dysarthria. Eyes in mid position, no gaze palsy, blinking to visual threat bilaterally, visual field full, PERRL. Right mild facial droop which was chronic.  Tongue protrusion midline. Moving all extremities on command, LUE and LLE 5/5, RUE 4/5, RLE 4/5 proximal and 3/5 distally, which is chronic. DTR 1+ and no babinski. Sensation symmetrical, coordination slow with right FTN dysmetria but proportional to weakness which likely to be chronic and gait not tested.   ASSESSMENT/PLAN Mr. Jeffery Reilly is a 74 y.o. male with history of  L MCA stroke with resultant R sided weakness and HTN, but has not seen a physician in 6-7 yrs and is not on any medications presenting to [facility] with transient recurrent episodes of  aphasia and automatisms of R arm with increased tone in all extremities. CTA showed distal R M2 occlusion. Transferred t [facility] for possible IR.    Stroke:  R MCA scattered infarcts with right M1 occlusion s/p R M1 thrombectomy and stent, source most likely due to large vessel source given severe athero seen during angiogram Right cerebellar punctate infarct: likely procedure related  Code Stroke CT head No acute abnormality. Small vessel disease. ASPECTS 10.     CTA head distal R M1 occlusion w/ partial reconstitution of distal territory.   CTA neck Unremarkable   Cerebral angio R M1 occlusion treated w/ DES balloon inflatable stent w/ TICI3 flow  MRI scattered right MCA infarcts with one punctate right cerebellar infarct  MRA right M1 stent patent  2D Echo EF 60-65%   LE venous doppler no DVT   Given unexpected right cerebellar infarct, although likely procedure related, but will recommend 30 day cardiac event monitoring to rule out afib  LDL 143  HgbA1c 7.4  UDS neg  Heparin 5000 units sq tid for VTE prophylaxis  No antithrombotic prior to admission, now on aspirin 81 mg daily and clopidogrel 75 mg daily.  Continue DAPT for 3 to 6 months due to stent placement.  Therapy recommendations:  CIR  Disposition:  pending   Transfer to the floor  Possible seizure   Episode of automatisms and increased body tone  treated w/ ativan and Keppra load 1gm  EEG no seizure, mild diffuse encephalopathy  Continue Keppra 500 twice daily  Acute Respiratory Failure, resolved  Intubated for IR,  left intubated d/t inability to protect airway  Extubated 5/19  Tolerating well  CCM signed off  Hypertensive emergency  BP on arrival 186/94  Home meds:  None (non-compliant), does have hx HTN . off Cleviprex  . Increase SBP goal to < 160  . On lisinopril 40 . Long-term BP goal normotensive  Hyperlipidemia  Home meds:  No statin  Now on lipitor 80   LDL 143, goal <  70  TG 316 -> 240  Continue statin at discharge  Diabetes   HgbA1c 7.4, goal < 7.0  CBGs  SSI  Close PCP follow up  Dysphagia . Secondary to stroke . Passed swallow now . On diet . Speech on board . Off IVF   Other Stroke Risk Factors  Advanced age  Former Cigarette smoker  Hx stroke/TIA  20 yrs ago per wife w/ resultant R hemiparesis    Other Active Problems  Hx abdominal surgery  Hospital day # 3    Marvel Plan, MD PhD Stroke Neurology 11/02/2019 10:21 AM     To contact Stroke Continuity provider, please refer to [email or web address]. After hours, contact General Neurology

## 2019-11-02 NOTE — PMR Pre-admission (Signed)
PMR Admission Coordinator Pre-Admission Assessment  Patient: Jeffery Reilly is an 74 y.o., male MRN: [id number] DOB: Jan 31, 1946 Height: '5\' 10"'  (177.8 cm) Weight:                Insurance Information HMO:     PPO:  PCP:      IPA:      80/20:      OTHER:  PRIMARY: medicare a and b      Policy#: [id number]      Subscriber: pt CM Name:       Phone#:      Fax#:  Pre-Cert#: verified Civil engineer, contracting:  Benefits:  Phone #:      Name:  Eff. Date: A 11/12/01, B 08/13/10     Deduct: $1484      Out of Pocket Max: n/a      Life Max: n/a  CIR: 100%      SNF: 20 full days Outpatient: 80%     Co-Pay: 20% Home Health: 100%      Co-Pay:  DME: 80%     Co-Pay: 20% Providers:  SECONDARY:       Policy#:       Phone#:   Development worker, community:       Phone#:   The Therapist, art Information Summary" for patients in Inpatient Rehabilitation Facilities with attached "Privacy Act [place] Records" was provided and verbally reviewed with: Patient and Family  Emergency Contact Information Contact Information    Name Relation Home Work Mobile   Jeffery Reilly Spouse   [phone]     Current Medical History  Patient Admitting Diagnosis: CVA  History of Present Illness: Jeffery Reilly is a 74 year old right-handed male with history of CVA with residual right-sided weakness, morbid obesity with BMI 31.03, tobacco abuse on no prescription medications and reported medical noncompliance.  Per chart review lives with spouse independent prior to admission but rather sedentary.  1 level home 4 steps to entry.  Presented 10/30/2019 to [facility] with aphasia as well as altered mental status and left-sided weakness as well as dysarthria.  Cranial CT scan showed advanced chronic small vessel disease no changes.  CT angiogram of head and neck distal right M1 MCA occlusion.  No hemodynamically significant stenosis in the neck.  Patient underwent emergent thrombectomy of right MCA M1 occlusion  with rescue stenting per Dr. Earleen Newport of interventional radiology.  A follow-up MRI showed scattered foci of restricted diffusion throughout the right MCA territory with a cluster at the temporal occipital region consistent with acute/subacute infarction.  Echocardiogram with ejection fraction of 57% grade 1 diastolic dysfunction.  There was report of possible seizure with increased body tone treated with Ativan and Keppra and currently maintained on Keppra 500 mg twice daily.  EEG negative for seizure but was suggestive of mild diffuse encephalopathy.  Admission chemistries unremarkable except glucose 131, urine drug screen negative, SARS coronavirus negative.  Patient is currently maintained on Plavix and aspirin for 3 to 6 months due to stent placement per neurology services.  Subcutaneous heparin for DVT prophylaxis with lower extremity Dopplers negative.  Noted dysphagia with speech therapy follow-up modified barium swallow placed on a dysphagia #2 thin liquid diet..  Therapy evaluations completed and patient was recommended for a comprehensive rehab program.  Complete NIHSS TOTAL: 8 Glasgow Coma Scale Score: 12  Past Medical History  Past Medical History:  Diagnosis Date  . Stroke [facility])     Family History  family  history includes Hypertension in his father and mother.  Prior Rehab/Hospitalizations:  Has the patient had prior rehab or hospitalizations prior to admission? No  Has the patient had major surgery during 100 days prior to admission? Yes  Current Medications   Current Facility-Administered Medications:  .   stroke: mapping our early stages of recovery book, , Does not apply, Once, Marliss Coots, PA-C .  0.9 %  sodium chloride infusion, , Intravenous, Continuous, Rosalin Hawking, MD, Stopped at 11/02/19 [phone] .  0.9 %  sodium chloride infusion, 250 mL, Intravenous, Continuous, Amie Portland, MD .  acetaminophen (TYLENOL) tablet 650 mg, 650 mg, Oral, Q4H PRN **OR** acetaminophen  (TYLENOL) 160 MG/5ML solution 650 mg, 650 mg, Per Tube, Q4H PRN **OR** acetaminophen (TYLENOL) suppository 650 mg, 650 mg, Rectal, Q4H PRN, Marliss Coots, PA-C, 650 mg at 10/31/19 1834 .  aspirin chewable tablet 81 mg, 81 mg, Oral, Daily, 81 mg at 11/02/19 1016 **OR** [DISCONTINUED] aspirin chewable tablet 81 mg, 81 mg, Per Tube, Daily, Corrie Mckusick, DO, 81 mg at 10/31/19 1058 .  atorvastatin (LIPITOR) tablet 80 mg, 80 mg, Oral, Daily, Blenda Nicely, RPH, 80 mg at 11/02/19 1016 .  Chlorhexidine Gluconate Cloth 2 % PADS 6 each, 6 each, Topical, Daily, Amie Portland, MD, 6 each at 11/01/19 1200 .  clopidogrel (PLAVIX) tablet 75 mg, 75 mg, Oral, Daily, 75 mg at 11/02/19 1016 **OR** [DISCONTINUED] clopidogrel (PLAVIX) tablet 75 mg, 75 mg, Per Tube, Daily, Corrie Mckusick, DO, 75 mg at 10/31/19 1100 .  heparin injection 5,000 Units, 5,000 Units, Subcutaneous, Q8H, Agarwala, Ravi, MD, 5,000 Units at 11/02/19 0533 .  hydrALAZINE (APRESOLINE) injection 5-10 mg, 5-10 mg, Intravenous, Q4H PRN, Rosalin Hawking, MD .  insulin aspart (novoLOG) injection 0-6 Units, 0-6 Units, Subcutaneous, Q4H, Rosalin Hawking, MD .  levETIRAcetam (KEPPRA) tablet 500 mg, 500 mg, Oral, BID, Rosalin Hawking, MD, 500 mg at 11/02/19 1016 .  [START ON 11/03/2019] lisinopril (ZESTRIL) tablet 20 mg, 20 mg, Oral, Daily, Rosalin Hawking, MD .  senna-docusate (Senokot-S) tablet 1 tablet, 1 tablet, Per Tube, QHS PRN, Rosalin Hawking, MD  Patients Current Diet:  Diet Order            DIET DYS 2 Room service appropriate? Yes with Assist; Fluid consistency: Thin  Diet effective now              Precautions / Restrictions Precautions Precautions: Fall Restrictions Weight Bearing Restrictions: No   Has the patient had 2 or more falls or a fall with injury in the past year?No  Prior Activity Level Community (5-7x/wk): retired, working in the yard during the days, still driving, no DME used  Prior Functional Level Prior Function Level of  Independence: Independent Comments: pt ambulates household and limited community distances, raises hogs  Self Care: Did the patient need help bathing, dressing, using the toilet or eating?  Independent  Indoor Mobility: Did the patient need assistance with walking from room to room (with or without device)? Independent  Stairs: Did the patient need assistance with internal or external stairs (with or without device)? Independent  Functional Cognition: Did the patient need help planning regular tasks such as shopping or remembering to take medications? Independent  Home Assistive Devices / Equipment Home Assistive Devices/Equipment: None Home Equipment: Cane - single point, Shower seat  Prior Device Use: Indicate devices/aids used by the patient prior to current illness, exacerbation or injury? None of the above  Current Functional Level Cognition  Arousal/Alertness: Awake/alert Overall Cognitive  Status: Impaired/Different from baseline Current Attention Level: Sustained Orientation Level: Oriented to person, Oriented to place, Disoriented to time, Disoriented to situation Following Commands: Follows one step commands consistently Safety/Judgement: Decreased awareness of safety, Decreased awareness of deficits Attention: Focused, Sustained, Selective Focused Attention: Appears intact Sustained Attention: Appears intact Selective Attention: Impaired Selective Attention Impairment: Verbal basic, Functional basic Memory: (not tested) Awareness: Impaired Awareness Impairment: Emergent impairment Problem Solving: Impaired Problem Solving Impairment: Functional basic Executive Function: Self Monitoring Self Monitoring: Impaired Self Monitoring Impairment: Verbal basic, Functional basic    Extremity Assessment (includes Sensation/Coordination)  Upper Extremity Assessment: RUE deficits/detail, LUE deficits/detail RUE Deficits / Details: ? RUE deficits at baeline; dysmetric movemetns  but uses functionally RUE Coordination: decreased fine motor LUE Deficits / Details: moving in isolated movement patterns; genrealized weakness LUE Coordination: decreased fine motor  Lower Extremity Assessment: Defer to PT evaluation    ADLs  Overall ADL's : Needs assistance/impaired Eating/Feeding: NPO Grooming: Minimal assistance, Sitting Upper Body Bathing: Minimal assistance, Sitting Lower Body Bathing: Maximal assistance, Sit to/from stand Upper Body Dressing : Moderate assistance, Sitting Upper Body Dressing Details (indicate cue type and reason): unaware that LUE was not in gown Lower Body Dressing: Maximal assistance, Sit to/from stand Toilet Transfer: Moderate assistance, Stand-pivot Toileting- Clothing Manipulation and Hygiene: Maximal assistance Functional mobility during ADLs: Moderate assistance, Rolling walker, Cueing for safety, Cueing for sequencing    Mobility  Overal bed mobility: Needs Assistance Bed Mobility: Supine to Sit, Sit to Supine Supine to sit: Min assist Sit to supine: Min assist    Transfers  Overall transfer level: Needs assistance Equipment used: Rolling walker (2 wheeled) Transfers: Sit to/from Stand Sit to Stand: Min assist, Mod assist Stand pivot transfers: Mod assist, +2 safety/equipment General transfer comment: modA with fatigue. Pt requires cues for hand placement    Ambulation / Gait / Stairs / Wheelchair Mobility  Ambulation/Gait Ambulation/Gait assistance: Min Web designer (Feet): 40 Feet(additional 20' and 5') Assistive device: Rolling walker (2 wheeled) Gait Pattern/deviations: Step-to pattern, Shuffle, Trunk flexed General Gait Details: pt with short shuffling steps despite PT cues for increased step length and foot clearance. PT provides cues to maintain BOS within RW Gait velocity: reduced Gait velocity interpretation: <1.8 ft/sec, indicate of risk for recurrent falls    Posture / Balance Dynamic Sitting  Balance Sitting balance - Comments: minG at edge of bed Balance Overall balance assessment: Needs assistance Sitting-balance support: Single extremity supported, Feet supported Sitting balance-Leahy Scale: Fair Sitting balance - Comments: minG at edge of bed Postural control: Left lateral lean Standing balance support: Bilateral upper extremity supported Standing balance-Leahy Scale: Fair Standing balance comment: minG with BUE support of RW    Special needs/care consideration Diabetic management yes and Designated visitor wife [place] (from acute therapy documentation) Living Arrangements: Spouse/significant other  Lives With: Spouse Available Help at Discharge: Family, Available 24 hours/day Type of Home: House Home Layout: One level Home Access: Stairs to enter Entrance Stairs-Rails: Can reach both Entrance Stairs-Number of Steps: 4 Bathroom Shower/Tub: Optometrist: Yes How Accessible: Accessible via walker [place]: No  Discharge Living Setting Plans for Discharge Living Setting: Patient's home Type of Home at Discharge: House Discharge Home Layout: One level Discharge Home Access: Stairs to enter Entrance Stairs-Rails: Can reach both Entrance Stairs-Number of Steps: 4 Discharge Bathroom Shower/Tub: Tub/shower unit Discharge Bathroom Toilet: Standard Discharge Bathroom Accessibility: Yes How Accessible: Accessible via walker Does the  patient have any problems obtaining your medications?: No  Social/Family/Support Systems Patient Roles: Spouse Anticipated Caregiver: spouse Kamaury Cutbirth) Anticipated Caregiver's Contact Information: [phone] Ability/Limitations of Caregiver: supervision Caregiver Availability: 24/7 Discharge Plan Discussed with Primary Caregiver: Yes Is Caregiver In Agreement with Plan?: Yes Does Caregiver/Family have Issues with  Lodging/Transportation while Pt is in Rehab?: No   Goals Patient/Family Goal for Rehab: PT/OT/SLP supervision Expected length of stay: 10-14 days Additional Information: pts family has been rotating >2 visitors; I have explained they will only be allowed 2 visitors while on CIR Pt/Family Agrees to Admission and willing to participate: Yes Program Orientation Provided & Reviewed with Pt/Caregiver Including Roles  & Responsibilities: Yes   Decrease burden of Care through IP rehab admission: n/a   Possible need for SNF placement upon discharge: Not anticipated.    Patient Condition: I have reviewed medical records from [facility], spoken with CM, and patient and spouse. I met with patient at the bedside and discussed via phone for inpatient rehabilitation assessment.  Patient will benefit from ongoing PT, OT and SLP, can actively participate in 3 hours of therapy a day 5 days of the week, and can make measurable gains during the admission.  Patient will also benefit from the coordinated team approach during an Inpatient Acute Rehabilitation admission.  The patient will receive intensive therapy as well as Rehabilitation physician, nursing, social worker, and care management interventions.  Due to bladder management, bowel management, safety, skin/wound care, disease management, medication administration, pain management and patient education the patient requires 24 hour a day rehabilitation nursing.  The patient is currently min assist x40 with mobility and min to mod assist with basic ADLs.  Discharge setting and therapy post discharge at home with home health is anticipated.  Patient has agreed to participate in the Acute Inpatient Rehabilitation Program and will admit Saturday, May 22.  Preadmission Screen Completed By:  Michel Santee, PT, DPT 11/02/2019 2:22 PM ______________________________________________________________________   Discussed status with Dr. Dagoberto Ligas on 11/02/19 at  2:31 PM  and received approval for admission today.  Admission Coordinator:  Michel Santee, PT, DPT time 2:31 PM Sudie Grumbling 11/02/19

## 2019-11-03 ENCOUNTER — Encounter (HOSPITAL_COMMUNITY): Payer: Self-pay

## 2019-11-03 ENCOUNTER — Other Ambulatory Visit: Payer: Self-pay

## 2019-11-03 ENCOUNTER — Inpatient Hospital Stay (HOSPITAL_COMMUNITY)
Admission: EM | Admit: 2019-11-03 | Discharge: 2019-11-16 | DRG: 057 | Disposition: A | Discharge status: Home Health | Payer: Medicare Other | Source: Intra-hospital | Attending: Physical Medicine & Rehabilitation | Admitting: Physical Medicine & Rehabilitation

## 2019-11-03 DIAGNOSIS — Z8249 Family history of ischemic heart disease and other diseases of the circulatory system: Secondary | ICD-10-CM

## 2019-11-03 DIAGNOSIS — E785 Hyperlipidemia, unspecified: Secondary | ICD-10-CM | POA: Diagnosis present

## 2019-11-03 DIAGNOSIS — E119 Type 2 diabetes mellitus without complications: Secondary | ICD-10-CM | POA: Diagnosis present

## 2019-11-03 DIAGNOSIS — F419 Anxiety disorder, unspecified: Secondary | ICD-10-CM | POA: Diagnosis present

## 2019-11-03 DIAGNOSIS — I69354 Hemiplegia and hemiparesis following cerebral infarction affecting left non-dominant side: Principal | ICD-10-CM

## 2019-11-03 DIAGNOSIS — Z716 Tobacco abuse counseling: Secondary | ICD-10-CM

## 2019-11-03 DIAGNOSIS — I878 Other specified disorders of veins: Secondary | ICD-10-CM | POA: Diagnosis present

## 2019-11-03 DIAGNOSIS — I10 Essential (primary) hypertension: Secondary | ICD-10-CM | POA: Diagnosis present

## 2019-11-03 DIAGNOSIS — I69391 Dysphagia following cerebral infarction: Secondary | ICD-10-CM | POA: Diagnosis not present

## 2019-11-03 DIAGNOSIS — R1311 Dysphagia, oral phase: Secondary | ICD-10-CM | POA: Diagnosis present

## 2019-11-03 DIAGNOSIS — Z6831 Body mass index (BMI) 31.0-31.9, adult: Secondary | ICD-10-CM | POA: Diagnosis not present

## 2019-11-03 DIAGNOSIS — R4586 Emotional lability: Secondary | ICD-10-CM | POA: Diagnosis present

## 2019-11-03 DIAGNOSIS — I63511 Cerebral infarction due to unspecified occlusion or stenosis of right middle cerebral artery: Secondary | ICD-10-CM | POA: Diagnosis present

## 2019-11-03 DIAGNOSIS — Z87891 Personal history of nicotine dependence: Secondary | ICD-10-CM | POA: Diagnosis not present

## 2019-11-03 DIAGNOSIS — K5900 Constipation, unspecified: Secondary | ICD-10-CM | POA: Diagnosis present

## 2019-11-03 DIAGNOSIS — I69390 Apraxia following cerebral infarction: Secondary | ICD-10-CM | POA: Diagnosis not present

## 2019-11-03 DIAGNOSIS — I69351 Hemiplegia and hemiparesis following cerebral infarction affecting right dominant side: Secondary | ICD-10-CM

## 2019-11-03 DIAGNOSIS — F482 Pseudobulbar affect: Secondary | ICD-10-CM

## 2019-11-03 DIAGNOSIS — I69322 Dysarthria following cerebral infarction: Secondary | ICD-10-CM | POA: Diagnosis not present

## 2019-11-03 DIAGNOSIS — I639 Cerebral infarction, unspecified: Secondary | ICD-10-CM | POA: Diagnosis not present

## 2019-11-03 DIAGNOSIS — Z9119 Patient's noncompliance with other medical treatment and regimen: Secondary | ICD-10-CM

## 2019-11-03 DIAGNOSIS — Z95820 Peripheral vascular angioplasty status with implants and grafts: Secondary | ICD-10-CM

## 2019-11-03 DIAGNOSIS — R1312 Dysphagia, oropharyngeal phase: Secondary | ICD-10-CM | POA: Diagnosis not present

## 2019-11-03 LAB — CBC
HCT: 44.8 % (ref 39.0–52.0)
Hemoglobin: 14.2 g/dL (ref 13.0–17.0)
MCH: 27.7 pg (ref 26.0–34.0)
MCHC: 31.7 g/dL (ref 30.0–36.0)
MCV: 87.3 fL (ref 80.0–100.0)
Platelets: 177 10*3/uL (ref 150–400)
RBC: 5.13 MIL/uL (ref 4.22–5.81)
RDW: 13.4 % (ref 11.5–15.5)
WBC: 6.7 10*3/uL (ref 4.0–10.5)
nRBC: 0 % (ref 0.0–0.2)

## 2019-11-03 LAB — BASIC METABOLIC PANEL
Anion gap: 8 (ref 5–15)
BUN: 10 mg/dL (ref 8–23)
CO2: 27 mmol/L (ref 22–32)
Calcium: 9.1 mg/dL (ref 8.9–10.3)
Chloride: 104 mmol/L (ref 98–111)
Creatinine, Ser: 0.97 mg/dL (ref 0.61–1.24)
GFR calc Af Amer: >60 mL/min (ref >60)
GFR calc non Af Amer: >60 mL/min (ref >60)
Glucose, Bld: 122 mg/dL — ABNORMAL HIGH (ref 70–99)
Potassium: 4.1 mmol/L (ref 3.5–5.1)
Sodium: 139 mmol/L (ref 135–145)

## 2019-11-03 LAB — GLUCOSE, CAPILLARY
Glucose-Capillary: 103 mg/dL — ABNORMAL HIGH (ref 70–99)
Glucose-Capillary: 107 mg/dL — ABNORMAL HIGH (ref 70–99)
Glucose-Capillary: 115 mg/dL — ABNORMAL HIGH (ref 70–99)
Glucose-Capillary: 115 mg/dL — ABNORMAL HIGH (ref 70–99)
Glucose-Capillary: 131 mg/dL — ABNORMAL HIGH (ref 70–99)
Glucose-Capillary: 137 mg/dL — ABNORMAL HIGH (ref 70–99)

## 2019-11-03 MED ORDER — INSULIN ASPART 100 UNIT/ML ~~LOC~~ SOLN
0.0000 [IU] | Freq: Three times a day (TID) | SUBCUTANEOUS | Status: DC
  Administered 2019-11-04 (×2): 1 [IU] via SUBCUTANEOUS
  Administered 2019-11-05: 2 [IU] via SUBCUTANEOUS

## 2019-11-03 MED ORDER — ATORVASTATIN CALCIUM 80 MG PO TABS
80.0000 mg | ORAL_TABLET | Freq: Every day | ORAL | Status: DC
  Administered 2019-11-04 – 2019-11-16 (×13): 80 mg via ORAL
  Filled 2019-11-03 (×14): qty 1

## 2019-11-03 MED ORDER — LEVETIRACETAM 500 MG PO TABS
500.0000 mg | ORAL_TABLET | Freq: Two times a day (BID) | ORAL | Status: DC
  Administered 2019-11-03 – 2019-11-10 (×14): 500 mg via ORAL
  Filled 2019-11-03 (×14): qty 1

## 2019-11-03 MED ORDER — ASPIRIN 81 MG PO CHEW
81.0000 mg | CHEWABLE_TABLET | Freq: Every day | ORAL | Status: DC
  Administered 2019-11-04 – 2019-11-16 (×13): 81 mg via ORAL
  Filled 2019-11-03 (×14): qty 1

## 2019-11-03 MED ORDER — HEPARIN SODIUM (PORCINE) 5000 UNIT/ML IJ SOLN
5000.0000 [IU] | Freq: Three times a day (TID) | INTRAMUSCULAR | Status: DC
  Administered 2019-11-03 – 2019-11-16 (×38): 5000 [IU] via SUBCUTANEOUS
  Filled 2019-11-03 (×38): qty 1

## 2019-11-03 MED ORDER — ACETAMINOPHEN 160 MG/5ML PO SOLN: 650.0000 mg | ORAL | Status: DC | PRN

## 2019-11-03 MED ORDER — CLOPIDOGREL BISULFATE 75 MG PO TABS
75.0000 mg | ORAL_TABLET | Freq: Every day | ORAL | Status: DC
  Administered 2019-11-04 – 2019-11-16 (×13): 75 mg via ORAL
  Filled 2019-11-03 (×14): qty 1

## 2019-11-03 MED ORDER — LISINOPRIL 20 MG PO TABS
20.0000 mg | ORAL_TABLET | Freq: Every day | ORAL | Status: DC
  Administered 2019-11-04 – 2019-11-09 (×6): 20 mg via ORAL
  Filled 2019-11-03 (×7): qty 1

## 2019-11-03 MED ORDER — ACETAMINOPHEN 650 MG RE SUPP: 650.0000 mg | RECTAL | Status: DC | PRN

## 2019-11-03 MED ORDER — SORBITOL 70 % SOLN
30.0000 mL | Freq: Every day | Status: DC | PRN
  Administered 2019-11-03 – 2019-11-10 (×3): 30 mL via ORAL
  Filled 2019-11-03 (×4): qty 30

## 2019-11-03 MED ORDER — ACETAMINOPHEN 325 MG PO TABS: 650.0000 mg | ORAL_TABLET | ORAL | Status: DC | PRN

## 2019-11-03 NOTE — Progress Notes (Signed)
Patient to rehab alert per bed accompanied by RN and family members; skin assessment done with [place]. Fall precaution bundle discussed with patient and family; Oriented to unit set up.

## 2019-11-03 NOTE — Discharge Summary (Signed)
Patient ID: Jeffery Reilly   MRN: [id number]      DOB: March 06, 1946  Date of Admission: 10/30/2019 Date of Discharge: 11/03/2019  Attending Physician:  Marvel Plan, MD, Stroke MD Consultant(s):   Critical Care Medicine Patient's PCP:  Patient, No Pcp Per   DISCHARGE DIAGNOSIS:   R MCA scattered infarcts with right M1 occlusion s/p R M1 thrombectomy and stent.  Right cerebellar punctate infarct  Essential hypertension  Diabetes  Hyperlipidemia - LDL goal < 70  Possible seizures - Keppra  Medical non compliance  Dysphagia secondary to stroke  Previous stroke with residual right hemiparesis   Past Medical History:  Diagnosis Date  . Stroke [facility])    Past Surgical History:  Procedure Laterality Date  . ABDOMINAL SURGERY    . IR CT HEAD LTD  10/30/2019  . IR INTRA CRAN STENT  10/30/2019  . IR PERCUTANEOUS ART THROMBECTOMY/INFUSION INTRACRANIAL INC DIAG ANGIO  10/30/2019  . IR US GUIDE VASC ACCESS RIGHT  10/30/2019  . RADIOLOGY WITH ANESTHESIA N/A 10/30/2019   Procedure: IR WITH ANESTHESIA;  Surgeon: Gilmer Mor, DO;  Location: [facility] OR;  Service: Radiology;  Laterality: N/A;    HOSPITAL MEDICATIONS .  stroke: mapping our early stages of recovery book   Does not apply Once  . aspirin  81 mg Oral Daily  . atorvastatin  80 mg Oral Daily  . Chlorhexidine Gluconate Cloth  6 each Topical Daily  . clopidogrel  75 mg Oral Daily  . heparin injection (subcutaneous)  5,000 Units Subcutaneous Q8H  . insulin aspart  0-6 Units Subcutaneous Q4H  . levETIRAcetam  500 mg Oral BID  . lisinopril  20 mg Oral Daily    HOME MEDICATIONS PRIOR TO ADMISSION No medications prior to admission.    LABORATORY STUDIES CBC    Component Value Date/Time   WBC 6.7 11/03/2019 0354   RBC 5.13 11/03/2019 0354   HGB 14.2 11/03/2019 0354   HCT 44.8 11/03/2019 0354   PLT 177 11/03/2019 0354   MCV 87.3 11/03/2019 0354   MCH 27.7 11/03/2019 0354   MCHC 31.7 11/03/2019 0354   RDW 13.4 11/03/2019  0354   LYMPHSABS 2.1 10/30/2019 1527   MONOABS 0.7 10/30/2019 1527   EOSABS 0.0 10/30/2019 1527   BASOSABS 0.0 10/30/2019 1527   CMP    Component Value Date/Time   NA 139 11/03/2019 0354   K 4.1 11/03/2019 0354   CL 104 11/03/2019 0354   CO2 27 11/03/2019 0354   GLUCOSE 122 (H) 11/03/2019 0354   BUN 10 11/03/2019 0354   CREATININE 0.97 11/03/2019 0354   CALCIUM 9.1 11/03/2019 0354   PROT 7.4 10/30/2019 1527   ALBUMIN 3.7 10/30/2019 1527   AST 23 10/30/2019 1527   ALT 31 10/30/2019 1527   ALKPHOS 81 10/30/2019 1527   BILITOT 0.8 10/30/2019 1527   GFRNONAA >60 11/03/2019 0354   GFRAA >60 11/03/2019 0354   COAGS Lab Results  Component Value Date   INR 1.0 10/30/2019   Lipid Panel    Component Value Date/Time   CHOL 249 (H) 10/31/2019 0551   TRIG 240 (H) 11/01/2019 0500   HDL 43 10/31/2019 0551   CHOLHDL 5.8 10/31/2019 0551   VLDL 63 (H) 10/31/2019 0551   LDLCALC 143 (H) 10/31/2019 0551   HgbA1C  Lab Results  Component Value Date   HGBA1C 7.4 (H) 10/31/2019   Urinalysis    Component Value Date/Time   COLORURINE YELLOW (A) 03/21/2018 1141  APPEARANCEUR CLEAR (A) 03/21/2018 1141   LABSPEC 1.021 03/21/2018 1141   PHURINE 5.0 03/21/2018 1141   GLUCOSEU NEGATIVE 03/21/2018 1141   HGBUR NEGATIVE 03/21/2018 1141   BILIRUBINUR NEGATIVE 03/21/2018 1141   KETONESUR NEGATIVE 03/21/2018 1141   PROTEINUR NEGATIVE 03/21/2018 1141   NITRITE NEGATIVE 03/21/2018 1141   LEUKOCYTESUR SMALL (A) 03/21/2018 1141   Urine Drug Screen     Component Value Date/Time   LABOPIA NONE DETECTED 10/31/2019 0832   COCAINSCRNUR NONE DETECTED 10/31/2019 0832   LABBENZ NONE DETECTED 10/31/2019 0832   AMPHETMU NONE DETECTED 10/31/2019 0832   THCU NONE DETECTED 10/31/2019 0832   LABBARB NONE DETECTED 10/31/2019 0832    Alcohol Level    Component Value Date/Time   ETH <10 10/30/2019 1527     SIGNIFICANT DIAGNOSTIC STUDIES  CT HEAD CODE STROKE WO CONTRAST 10/30/2019   IMPRESSION:   1. Advanced chronic small vessel disease appears stable by CT since 2019.  2. No acute cortically based infarct or acute intracranial hemorrhage identified. ASPECTS 10.   CT Angio Head W or Wo Contrast CT Angio Neck W and/or Wo Contrast 10/30/2019 IMPRESSION:  Distal right M1 MCA occlusion. Partial reconstitution of more distal right MCA territory. No acute intracranial hemorrhage.  ASPECT score remains 10. No hemodynamically significant stenosis in the neck.   MR BRAIN WO CONTRAST MR ANGIO HEAD WO CONTRAST 10/31/2019 IMPRESSION:  1. Scattered small foci of restricted diffusion throughout the right MCA territory with a cluster at the temporal occipital region, consistent with acute/subacute infarcts.  2. A focus of restricted diffusion is also seen in the right cerebellar hemisphere.  3. Advanced chronic small vessel ischemia. Remote infarcts in the bilateral corona radiata, bilateral basal ganglia and pons.  4. Status post stenting of the mid/distal M1/MCA segment. Normal flow related enhancement is seen in most of the M2 branches, with the exception of one middle division branch which has attenuated flow related enhancement. 5. Diffuse decreased T1 signal within the visualized upper cervical spine, may represent red marrow reconversion versus marrow replacement.   IR CT Head Ltd 10/30/2019 IMPRESSION:  Status post ultrasound guided access right common femoral artery for cervical and cerebral angiogram, mechanical thrombectomy of right M1 occlusion, and rescue stenting of native intracranial atherosclerotic permissive lesion with 2.25 mm x 15 mm drug-eluting stent, restoring TICI 3 flow. Angio-Seal for hemostasis.  PLAN:  The patient will remain intubated. ICU status Target systolic blood pressure of 120-140 Right hip straight time 6 hours Frequent neurovascular checks Repeat neurologic imaging with CT and/MRI at the discretion of neurology team Dual anti-platelet therapy.   DG CHEST PORT 1  VIEW 10/31/2019 IMPRESSION:  No acute cardiopulmonary findings.  DG Swallowing Func-Speech Pathology 11/02/2019 Objective Swallowing Evaluation: Type of Study: MBS-Modified Barium Swallow Study  Patient Details Name: Jeffery Reilly MRN: [id number] Date of Birth: October 10, 1945 Today's Date: 11/02/2019 Time: SLP Start Time (ACUTE ONLY): 1300 -SLP Stop Time (ACUTE ONLY): 1330 SLP Time Calculation (min) (ACUTE ONLY): 30 min Past Medical History: Past Medical History: Diagnosis Date . Stroke [facility])  Past Surgical History: Past Surgical History: Procedure Laterality Date . ABDOMINAL SURGERY   . IR CT HEAD LTD  10/30/2019 . IR INTRA CRAN STENT  10/30/2019 . IR PERCUTANEOUS ART THROMBECTOMY/INFUSION INTRACRANIAL INC DIAG ANGIO  10/30/2019 . IR US GUIDE VASC ACCESS RIGHT  10/30/2019 . RADIOLOGY WITH ANESTHESIA N/A 10/30/2019  Procedure: IR WITH ANESTHESIA;  Surgeon: Gilmer Mor, DO;  Location: [facility] OR;  Service: Radiology;  Laterality: N/A; HPI: 74  y.o. male past medical history of mild residual right hemiparesis from a left cerebral hemispheric stroke  Patient presented to [place] [facility] for confusion/aphasia. Pt with aphasia, R gaze deviation and L weakness with possible neglect. CTA head/neck demonstrating R MCA occlusion. Pt underwent R MCA stenting on 5/18, intubated until 5/19.  MRI: Scattered small foci of restricted diffusion throughout the right MCA territory with a cluster at the temporal occipital region, consistent with acute/subacute infarcts. Remote infarcts in the bilateral corona radiata, bilateral basal ganglia and pons.  Subjective: Alert, cooperative Assessment / Plan / Recommendation CHL IP CLINICAL IMPRESSIONS 11/02/2019 Clinical Impression Pt presents with a moderate oral phase dysphagia and a normal pharyngeal phase with excellent pharyngeal clearance and no penetration/aspiration, even when taxed with mixed solid/liquid consistencies.  Oral phase was marked by significant incoordination and poor bolus  incohesion, anterior loss, residue and piecemeal swallowing.  Recommend advancing diet to dysphagia 2 with thin liquids; supervise to cue for pocketing/residue in oral cavity and anterior spillage.  Pills may be given whole in puree.  Pt is potentially D/Cing to CIR next date.  SLP Visit Diagnosis Dysphagia, oral phase (R13.11) Attention and concentration deficit following -- Frontal lobe and executive function deficit following -- Impact on safety and function --   CHL IP TREATMENT RECOMMENDATION 11/02/2019 Treatment Recommendations Therapy as outlined in treatment plan below   Prognosis 11/02/2019 Prognosis for Safe Diet Advancement Good Barriers to Reach Goals -- Barriers/Prognosis Comment -- CHL IP DIET RECOMMENDATION 11/02/2019 SLP Diet Recommendations Thin liquid;Dysphagia 2 (Fine chop) solids Liquid Administration via Cup;Straw Medication Administration Whole meds with puree Compensations Slow rate;Small sips/bites Postural Changes Remain semi-upright after after feeds/meals (Comment)   CHL IP OTHER RECOMMENDATIONS 11/02/2019 Recommended Consults -- Oral Care Recommendations Oral care BID Other Recommendations Have oral suction available   CHL IP FOLLOW UP RECOMMENDATIONS 11/02/2019 Follow up Recommendations Inpatient Rehab   CHL IP FREQUENCY AND DURATION 11/02/2019 Speech Therapy Frequency (ACUTE ONLY) min 2x/week Treatment Duration 1 week      CHL IP ORAL PHASE 11/02/2019 Oral Phase Impaired Oral - Pudding Teaspoon -- Oral - Pudding Cup -- Oral - Honey Teaspoon -- Oral - Honey Cup -- Oral - Nectar Teaspoon -- Oral - Nectar Cup -- Oral - Nectar Straw -- Oral - Thin Teaspoon -- Oral - Thin Cup -- Oral - Thin Straw Right anterior bolus loss;Impaired mastication;Weak lingual manipulation;Lingual pumping;Right pocketing in lateral sulci;Pocketing in anterior sulcus;Piecemeal swallowing;Delayed oral transit;Decreased bolus cohesion Oral - Puree Impaired mastication;Weak lingual manipulation;Lingual pumping;Right  pocketing in lateral sulci;Pocketing in anterior sulcus;Piecemeal swallowing;Delayed oral transit;Decreased bolus cohesion Oral - Mech Soft -- Oral - Regular Impaired mastication;Weak lingual manipulation;Lingual pumping;Right pocketing in lateral sulci;Pocketing in anterior sulcus;Piecemeal swallowing;Delayed oral transit;Decreased bolus cohesion Oral - Multi-Consistency -- Oral - Pill -- Oral Phase - Comment --  CHL IP PHARYNGEAL PHASE 11/02/2019 Pharyngeal Phase WFL Pharyngeal- Pudding Teaspoon -- Pharyngeal -- Pharyngeal- Pudding Cup -- Pharyngeal -- Pharyngeal- Honey Teaspoon -- Pharyngeal -- Pharyngeal- Honey Cup -- Pharyngeal -- Pharyngeal- Nectar Teaspoon -- Pharyngeal -- Pharyngeal- Nectar Cup -- Pharyngeal -- Pharyngeal- Nectar Straw -- Pharyngeal -- Pharyngeal- Thin Teaspoon -- Pharyngeal -- Pharyngeal- Thin Cup -- Pharyngeal -- Pharyngeal- Thin Straw -- Pharyngeal -- Pharyngeal- Puree -- Pharyngeal -- Pharyngeal- Mechanical Soft -- Pharyngeal -- Pharyngeal- Regular -- Pharyngeal -- Pharyngeal- Multi-consistency -- Pharyngeal -- Pharyngeal- Pill -- Pharyngeal -- Pharyngeal Comment --  No flowsheet data found. Jeffery Reilly 11/02/2019, 4:32 PM  EEG adult 10/31/2019 ABNORMALITY  Continuous slow, generalized  IMPRESSION:  This study is suggestive of mild diffuse encephalopathy, nonspecific etiology. No seizures or epileptiform discharges were seen throughout the recording.   ECHOCARDIOGRAM COMPLETE 11/01/2019 IMPRESSIONS   1. Left ventricular ejection fraction, by estimation, is 60 to 65%. The left ventricle has normal function. The left ventricle has no regional wall motion abnormalities. Left ventricular diastolic parameters are consistent with Grade I diastolic dysfunction (impaired relaxation).   2. Right ventricular systolic function is normal. The right ventricular size is normal. Tricuspid regurgitation signal is inadequate for assessing PA pressure.   3. The mitral  valve is normal in structure. No evidence of mitral valve regurgitation. No evidence of mitral stenosis.   4. The aortic valve is normal in structure. Aortic valve regurgitation is not visualized. No aortic stenosis is present.   5. Aortic dilatation noted. There is mild dilatation of the aortic root measuring 42 mm.   6. The inferior vena cava is normal in size with greater than 50% respiratory variability, suggesting right atrial pressure of 3 mmHg. Conclusion(s)/Recommendation(s): No intracardiac source of embolism detected on this transthoracic study. A transesophageal echocardiogram is recommended to exclude cardiac source of embolism if clinically indicated.   VAS US LOWER EXTREMITY VENOUS (DVT) 11/01/2019 Summary:  BILATERAL: - No evidence of deep vein thrombosis seen in the lower extremities, bilaterally.        HISTORY OF PRESENT ILLNESS (From Dr Bess HarvestArora's H&P on 10/30/2019) Jeffery Reilly is a 74 y.o. male past medical history of mild residual right hemiparesis from a left cerebral hemispheric stroke  Patient presented to [place] [facility] for intermittent confusion/aphasia. Per EMS he had been dealing with intermittent aphasia throughout the morning. EMS was previously called to residence by wife on 3 separate occasions. Each time his aphasia had resolved.  The last EMS call, they were called again for same symptoms and aphasia persisted.  Last known well sometime between 12 - 2 PM.  Along with aphasia, patient had right gaze deviation left sided weakness and possible neglect.  Patient was [facility]. CT and CTA head and neck were obttained.   CT head showed advanced chronic small vessel disease with no acute infarct.  Had some automatisms of the right arm and increased tone in all 4 extremities evaluated by neurologist at [facility], stroke as a differential was entertained but seizure was considered more likely -in any case obtained vessel imaging. CTA head and  neck obtained showing distal right M1 MCA occlusion with partial reconstruction more distal right MCA.  Case was discussed with me (Dr Wilford CornerArora)  over the phone by Dr. Loretha BrasilZeylikman, and I accepted the patient to IR after discussing the case with interventional radiologist Dr. Loreta AveWagner. I spoke with the wife and she reported that he is very independent at baseline, has some arthritis of the right knee that makes walking difficult but does not use a cane or a walker at baseline.  Able to feed and bathe himself normally without a problem.  He has not seen a doctor in the past many years.  I was also unable to find any Care Everywhere records. LKW: sometime between 1200-400 hrs tpa given?: no, decision made at [facility] by [facility]ist, and by the time of arrival at [facility], if considering 12 PM last known well, outside the window at that time. Premorbid modified Rankin scale (mRS): 2 NIHSS: 4015  HOSPITAL COURSE Jeffery Reilly is a 74 y.o. male with history  of L MCA stroke with resultant R sided weakness and HTN, but has not seen a physician in 6-7 yrs and is not on any medications presenting to [facility] with transient recurrent episodes of aphasia and automatisms of R arm with increased tone in all extremities. CTA showed distal R M2 occlusion. Transferred t [facility] for possible IR.    Stroke:  R MCA scattered infarcts with right M1 occlusion s/p R M1 thrombectomy and stent, source most likely due to large vessel source given severe athero seen during angiogram Right cerebellar punctate infarct: likely procedure related  Code Stroke CT head No acute abnormality. Small vessel disease. ASPECTS 10.     CTA head distal R M1 occlusion w/ partial reconstitution of distal territory.   CTA neck Unremarkable   Cerebral angio R M1 occlusion treated w/ DES balloon inflatable stent w/ TICI3 flow  MRI scattered right MCA infarcts with one punctate right cerebellar infarct  MRA  right M1 stent patent  2D Echo EF 60-65%   LE venous doppler no DVT   Given unexpected right cerebellar infarct, although likely procedure related, but will recommend 30 day cardiac event monitoring to rule out afib  LDL 143  HgbA1c 7.4  UDS neg  Heparin 5000 units sq tid for VTE prophylaxis  No antithrombotic prior to admission, now on aspirin 81 mg daily and clopidogrel 75 mg daily.  Continue DAPT for 3 to 6 months due to stent placement.  Therapy recommendations:  CIR  Disposition:  Discharge patient today for CIR admission  Possible seizure   Episode of automatisms and increased body tone  treated w/ ativan and Keppra load 1gm  EEG no seizure, mild diffuse encephalopathy  Continue Keppra 500 twice daily  Acute Respiratory Failure, resolved  Intubated for IR, left intubated d/t inability to protect airway  Extubated 5/19  Tolerating well  CCM signed off  Hypertensive emergency  BP on arrival 186/94  Home meds:  None (non-compliant), does have hx HTN  Off Cleviprex   Increase SBP goal to < 160   On lisinopril 40  Long-term BP goal normotensive  Hyperlipidemia  Home meds:  No statin  Now on lipitor 80   LDL 143, goal < 70  TG 316 -> 240  Continue statin at discharge  Diabetes   HgbA1c 7.4, goal < 7.0  CBGs  SSI  Close PCP follow up  Dysphagia  Secondary to stroke  Passed swallow now  On diet  Speech on board  Off IVF   Other Stroke Risk Factors  Advanced age  Former Cigarette smoker  Hx stroke/TIA ? 20 yrs ago per wife w/ resultant R hemiparesis    Other Active Problems  Hx abdominal surgery  DISCHARGE EXAM Vitals:   11/02/19 1942 11/02/19 2300 11/03/19 0400 11/03/19 0912  BP: 114/75 129/77 113/70 102/66  Pulse: 63 79 (!) 59 76  Resp: 16 16 15 16   Temp: 99.5 F (37.5 C) 98.2 F (36.8 C) 98.5 F (36.9 C) 98.5 F (36.9 C)  TempSrc: Oral Oral Oral Oral  SpO2: 100% 91%  99%  Height:       General  - Well nourished, well developed, not in acute distress.  Ophthalmologic - fundi not visualized due to noncooperation.  Cardiovascular - Regular rate and rhythm.  Neuro - awake, alert, eyes open, orientated to place, month and age, not to month. Following simple commands. Moderate dysarthria. Eyes in mid position, no gaze palsy, blinking to visual threat bilaterally, visual  field full, PERRL. Right mild facial droop which was chronic.  Tongue protrusion midline. Moving all extremities on command, LUE and LLE 5/5, RUE 4/5, RLE 4/5 proximal and 3/5 distally, which is chronic. DTR 1+ and no babinski. Sensation symmetrical, coordination slow with right FTN dysmetria but proportional to weakness which likely to be chronic and gait not tested.  Discharge Diet   Diet Order            DIET DYS 2 Room service appropriate? Yes with Assist; Fluid consistency: Thin  Diet effective now             liquids  DISCHARGE PLAN  Disposition:  Discharge to [facility] for ongoing PT, OT and ST  Now on aspirin 81 mg daily and clopidogrel 75 mg daily.  Continue DAPT for 3 to 6 months due to stent placement.  Recommend ongoing risk factor control by Primary Care Physician at time of discharge from inpatient rehabilitation.  Follow-up Patient, No Pcp Per in 2 weeks following discharge from rehab.  Follow-up in [facility] in 4 weeks following discharge from rehab, office to schedule an appointment.   This patient will need to be scheduled for an outpatient 30 day cardiac monitor at time of discharge from the Inpatient Rehabilitation unit.  40 minutes were spent preparing discharge.  Marvel Plan, MD PhD Stroke Neurology 11/03/2019 4:05 PM

## 2019-11-03 NOTE — H&P (Addendum)
Physical Medicine and Rehabilitation Admission H&P     HPI: Jeffery Reilly is a 74 year old right-handed male with history of CVA with residual right-sided weakness, morbid obesity with BMI 31.03, tobacco abuse on no prescription medications and reported medical noncompliance.  Per chart review lives with spouse independent prior to admission but rather sedentary.  1 level home 4 steps to entry.  Presented 10/30/2019 to [facility] with aphasia as well as altered mental status and left-sided weakness as well as dysarthria.  Cranial CT scan showed advanced chronic small vessel disease no changes.  CT angiogram of head and neck distal right M1 MCA occlusion.  No hemodynamically significant stenosis in the neck.  Patient underwent emergent thrombectomy of right MCA M1 occlusion with rescue stenting per Dr. Loreta Ave of interventional radiology.  A follow-up MRI showed scattered foci of restricted diffusion throughout the right MCA territory with a cluster at the temporal occipital region consistent with acute/subacute infarction.  Echocardiogram with ejection fraction of 65% grade 1 diastolic dysfunction.  There was report of possible seizure with increased body tone treated with Ativan and Keppra and currently maintained on Keppra 500 mg twice daily.  EEG negative for seizure but was suggestive of mild diffuse encephalopathy.  Admission chemistries unremarkable except glucose 131, urine drug screen negative, SARS coronavirus negative.  Patient is currently maintained on Plavix and aspirin for 3 to 6 months due to stent placement per neurology services.  Subcutaneous heparin for DVT prophylaxis with lower extremity Dopplers negative.  Noted dysphagia with speech therapy follow-up modified barium swallow placed on a dysphagia #2 thin liquid diet..  Therapy evaluations completed and patient was admitted for a comprehensive rehab program.   Pt reports no BM lately- "not eating much and until  today"   Review of Systems  Constitutional: Negative for chills and fever.  HENT: Negative for hearing loss.   Eyes: Negative for blurred vision and double vision.  Respiratory: Negative for cough and shortness of breath.   Cardiovascular: Positive for leg swelling. Negative for chest pain and palpitations.  Gastrointestinal: Positive for constipation. Negative for heartburn, nausea and vomiting.  Genitourinary: Positive for urgency. Negative for dysuria, flank pain and hematuria.  Musculoskeletal: Positive for joint pain and myalgias.  Skin: Negative for rash.  All other systems reviewed and are negative.  Past Medical History:  Diagnosis Date  . Stroke [facility])    Past Surgical History:  Procedure Laterality Date  . ABDOMINAL SURGERY    . IR CT HEAD LTD  10/30/2019  . IR INTRA CRAN STENT  10/30/2019  . IR PERCUTANEOUS ART THROMBECTOMY/INFUSION INTRACRANIAL INC DIAG ANGIO  10/30/2019  . IR US GUIDE VASC ACCESS RIGHT  10/30/2019  . RADIOLOGY WITH ANESTHESIA N/A 10/30/2019   Procedure: IR WITH ANESTHESIA;  Surgeon: Gilmer Mor, DO;  Location: [facility] OR;  Service: Radiology;  Laterality: N/A;   Family History  Problem Relation Age of Onset  . Hypertension Mother   . Hypertension Father    Social History:  reports that he has quit smoking. He has never used smokeless tobacco. He reports that he does not drink alcohol or use drugs. Allergies: No Known Allergies No medications prior to admission.    Drug Regimen Review Drug regimen was reviewed and remains appropriate with no significant issues identified  Home: Home Living Family/patient expects to be discharged to:: Private residence Living Arrangements: Spouse/significant other   Functional History:    Functional Status:  Mobility:  ADL:    Cognition: Cognition Orientation Level: Oriented X4    Physical Exam: There were no vitals taken for this visit. Physical Exam  Nursing note and vitals  reviewed. Constitutional:  Awake, alert, denies pain; appropriate, sitting up in bed; NAD  HENT:  Head: Normocephalic and atraumatic.  Nose: Nose normal.  Mouth/Throat: Oropharynx is clear and moist. No oropharyngeal exudate.  Couldn't smile still- oral apraxia still noted; R tongue deviation - strong  Eyes: Conjunctivae are normal.  EOMI B/L- no nystagmus seen  Neck: No tracheal deviation present.  Cardiovascular:  RRR_ no JVD  Respiratory:  Little coarse- no W/R/R Upper airway audible gurgly sounds, but not on auscultation  GI:  Soft, NT, ND, (+)BS   Genitourinary:    Genitourinary Comments: (+) condom cathter in place   Musculoskeletal:     Cervical back: Normal range of motion and neck supple.     Comments: No change in strength exam, except HF slightly stronger UEs biceps 4-/5, triceps 4-/5, WE 4/5, grip 4/5, finger abd 3+/5 B/: LEs- HF 3-/5, KE 3+/5, DF R 2-/5 and L 3/5, PF 4-/5  Apraxia noted with strength exam as well  Neurological: He is alert.  Patient is alert.  Dysarthric with oral apraxia.  Follows simple commands.  Sensation intact to light touch Apraxia oral, verbal and limb apraxia Sounds like talking with marbles in mouth Hard to understand   Skin: Skin is warm and dry.  No skin breakdown seen L antecubital fossa IV- no infiltration  Trace LE edema Some venous stasis changes below knees B/L Heels OK  Psychiatric:  Appropriate, but dysarthric    Results for orders placed or performed during the hospital encounter of 11/03/19 (from the past 48 hour(s))  Glucose, capillary     Status: Abnormal   Collection Time: 11/03/19  5:37 PM  Result Value Ref Range   Glucose-Capillary 115 (H) 70 - 99 mg/dL    Comment: Glucose reference range applies only to samples taken after fasting for at least 8 hours.   DG Swallowing Func-Speech Pathology  Result Date: 11/02/2019 Objective Swallowing Evaluation: Type of Study: MBS-Modified Barium Swallow Study  Patient  Details Name: Jeffery Reilly MRN: [id number] Date of Birth: 07-Aug-1945 Today's Date: 11/02/2019 Time: SLP Start Time (ACUTE ONLY): 1300 -SLP Stop Time (ACUTE ONLY): 1330 SLP Time Calculation (min) (ACUTE ONLY): 30 min Past Medical History: Past Medical History: Diagnosis Date . Stroke [facility])  Past Surgical History: Past Surgical History: Procedure Laterality Date . ABDOMINAL SURGERY   . IR CT HEAD LTD  10/30/2019 . IR INTRA CRAN STENT  10/30/2019 . IR PERCUTANEOUS ART THROMBECTOMY/INFUSION INTRACRANIAL INC DIAG ANGIO  10/30/2019 . IR US GUIDE VASC ACCESS RIGHT  10/30/2019 . RADIOLOGY WITH ANESTHESIA N/A 10/30/2019  Procedure: IR WITH ANESTHESIA;  Surgeon: Corrie Mckusick, DO;  Location: [place];  Service: Radiology;  Laterality: N/A; HPI: 74 y.o. male past medical history of mild residual right hemiparesis from a left cerebral hemispheric stroke  Patient presented to [facility] for confusion/aphasia. Pt with aphasia, R gaze deviation and L weakness with possible neglect. CTA head/neck demonstrating R MCA occlusion. Pt underwent R MCA stenting on 5/18, intubated until 5/19.  MRI: Scattered small foci of restricted diffusion throughout the right MCA territory with a cluster at the temporal occipital region, consistent with acute/subacute infarcts. Remote infarcts in the bilateral corona radiata, bilateral basal ganglia and pons.  Subjective: Alert, cooperative Assessment / Plan / Recommendation CHL IP CLINICAL IMPRESSIONS 11/02/2019 Clinical Impression Pt  presents with a moderate oral phase dysphagia and a normal pharyngeal phase with excellent pharyngeal clearance and no penetration/aspiration, even when taxed with mixed solid/liquid consistencies.  Oral phase was marked by significant incoordination and poor bolus incohesion, anterior loss, residue and piecemeal swallowing.  Recommend advancing diet to dysphagia 2 with thin liquids; supervise to cue for pocketing/residue in oral cavity and anterior spillage.  Pills may be  given whole in puree.  Pt is potentially D/Cing to CIR next date.  SLP Visit Diagnosis Dysphagia, oral phase (R13.11) Attention and concentration deficit following -- Frontal lobe and executive function deficit following -- Impact on safety and function --   CHL IP TREATMENT RECOMMENDATION 11/02/2019 Treatment Recommendations Therapy as outlined in treatment plan below   Prognosis 11/02/2019 Prognosis for Safe Diet Advancement Good Barriers to Reach Goals -- Barriers/Prognosis Comment -- CHL IP DIET RECOMMENDATION 11/02/2019 SLP Diet Recommendations Thin liquid;Dysphagia 2 (Fine chop) solids Liquid Administration via Cup;Straw Medication Administration Whole meds with puree Compensations Slow rate;Small sips/bites Postural Changes Remain semi-upright after after feeds/meals (Comment)   CHL IP OTHER RECOMMENDATIONS 11/02/2019 Recommended Consults -- Oral Care Recommendations Oral care BID Other Recommendations Have oral suction available   CHL IP FOLLOW UP RECOMMENDATIONS 11/02/2019 Follow up Recommendations Inpatient Rehab   CHL IP FREQUENCY AND DURATION 11/02/2019 Speech Therapy Frequency (ACUTE ONLY) min 2x/week Treatment Duration 1 week      CHL IP ORAL PHASE 11/02/2019 Oral Phase Impaired Oral - Pudding Teaspoon -- Oral - Pudding Cup -- Oral - Honey Teaspoon -- Oral - Honey Cup -- Oral - Nectar Teaspoon -- Oral - Nectar Cup -- Oral - Nectar Straw -- Oral - Thin Teaspoon -- Oral - Thin Cup -- Oral - Thin Straw Right anterior bolus loss;Impaired mastication;Weak lingual manipulation;Lingual pumping;Right pocketing in lateral sulci;Pocketing in anterior sulcus;Piecemeal swallowing;Delayed oral transit;Decreased bolus cohesion Oral - Puree Impaired mastication;Weak lingual manipulation;Lingual pumping;Right pocketing in lateral sulci;Pocketing in anterior sulcus;Piecemeal swallowing;Delayed oral transit;Decreased bolus cohesion Oral - Mech Soft -- Oral - Regular Impaired mastication;Weak lingual manipulation;Lingual  pumping;Right pocketing in lateral sulci;Pocketing in anterior sulcus;Piecemeal swallowing;Delayed oral transit;Decreased bolus cohesion Oral - Multi-Consistency -- Oral - Pill -- Oral Phase - Comment --  CHL IP PHARYNGEAL PHASE 11/02/2019 Pharyngeal Phase WFL Pharyngeal- Pudding Teaspoon -- Pharyngeal -- Pharyngeal- Pudding Cup -- Pharyngeal -- Pharyngeal- Honey Teaspoon -- Pharyngeal -- Pharyngeal- Honey Cup -- Pharyngeal -- Pharyngeal- Nectar Teaspoon -- Pharyngeal -- Pharyngeal- Nectar Cup -- Pharyngeal -- Pharyngeal- Nectar Straw -- Pharyngeal -- Pharyngeal- Thin Teaspoon -- Pharyngeal -- Pharyngeal- Thin Cup -- Pharyngeal -- Pharyngeal- Thin Straw -- Pharyngeal -- Pharyngeal- Puree -- Pharyngeal -- Pharyngeal- Mechanical Soft -- Pharyngeal -- Pharyngeal- Regular -- Pharyngeal -- Pharyngeal- Multi-consistency -- Pharyngeal -- Pharyngeal- Pill -- Pharyngeal -- Pharyngeal Comment --  No flowsheet data found. Blenda Mounts Laurice 11/02/2019, 4:32 PM                  Medical Problem List and Plan: 1.  Left-sided weakness with dysarthria secondary to right MCA scattered and single punctate right cerebellar infarct with right M1 occlusion status post right M1 thrombectomy and stent as well as history of CVA with right-sided residual weakness  -patient may  shower  -ELOS/Goals: 2.5-3 weeks- Supervision 2.  Antithrombotics: -DVT/anticoagulation: Subcutaneous heparin- Cr is OK- should be able to do Lovenox- will check and see if can change.  -antiplatelet therapy: Aspirin 81 mg daily and Plavix 75 mg daily 3. Pain Management: Tylenol as needed 4. Mood: Provide emotional support  -  antipsychotic agents: N/A 5. Neuropsych: This patient is capable of making decisions on his own behalf. 6. Skin/Wound Care: Routine skin checks 7. Fluids/Electrolytes/Nutrition: Routine in and outs with follow-up chemistries 8.  Dysphagia.  Follow-up speech therapy.  Presently on dysphagia #2 thin liquid diet. 9.   Hypertension.  Lisinopril 40 mg daily 10.  Seizure prophylaxis.  Keppra 500 mg twice daily.  EEG negative 11.  Hyperlipidemia.  Lipitor 12.  Morbid obesity.  BMI 31.03.  Dietary follow-up 13.  New findings diabetes mellitus.  Hemoglobin A1c 7.4.  SSI. 14.  Tobacco abuse.  Counseling 15.  Medical noncompliance.  Counseling  Mcarthur Rossetti. Anguilli, PA-C 11/03/2019   I have personally performed a face to face diagnostic evaluation of this patient and formulated the key components of the plan.  Additionally, I have personally reviewed laboratory data, imaging studies, as well as relevant notes and concur with the physician assistant's documentation above.   The patient's status has not changed from the original H&P.  Any changes in documentation from the acute care chart have been noted above.     Genice Rouge, MD 11/03/2019

## 2019-11-04 ENCOUNTER — Inpatient Hospital Stay (HOSPITAL_COMMUNITY): Payer: Medicare Other | Admitting: Speech Pathology

## 2019-11-04 ENCOUNTER — Inpatient Hospital Stay (HOSPITAL_COMMUNITY): Payer: Medicare Other | Admitting: Occupational Therapy

## 2019-11-04 ENCOUNTER — Inpatient Hospital Stay (HOSPITAL_COMMUNITY): Payer: Medicare Other

## 2019-11-04 LAB — GLUCOSE, CAPILLARY
Glucose-Capillary: 127 mg/dL — ABNORMAL HIGH (ref 70–99)
Glucose-Capillary: 127 mg/dL — ABNORMAL HIGH (ref 70–99)
Glucose-Capillary: 80 mg/dL (ref 70–99)
Glucose-Capillary: 140 mg/dL — ABNORMAL HIGH (ref 70–99)

## 2019-11-04 MED ORDER — SENNA 8.6 MG PO TABS
2.0000 | ORAL_TABLET | Freq: Every day | ORAL | Status: DC
  Administered 2019-11-04 – 2019-11-16 (×13): 17.2 mg via ORAL
  Filled 2019-11-04 (×14): qty 2

## 2019-11-04 MED ORDER — HYDROCERIN EX CREA
TOPICAL_CREAM | Freq: Two times a day (BID) | CUTANEOUS | Status: DC
  Administered 2019-11-08 – 2019-11-09 (×2): 1 via TOPICAL
  Filled 2019-11-04: qty 113

## 2019-11-04 NOTE — Progress Notes (Signed)
[place] PHYSICAL MEDICINE & REHABILITATION PROGRESS NOTE   Subjective/Complaints:  Pt reports doing "real good"- first step to getting home is "rehab". No BM yet- started eating Friday and feels a little constipated.    ROS:  Pt denies SOB, abd pain, CP, N/V/D, and vision changes   Objective:   DG Swallowing Func-Speech Pathology  Result Date: 11/02/2019 Objective Swallowing Evaluation: Type of Study: MBS-Modified Barium Swallow Study  Patient Details Name: Mykai Wendorf MRN: [id number] Date of Birth: [birth year] Today's Date: 11/02/2019 Time: SLP Start Time (ACUTE ONLY): 1300 -SLP Stop Time (ACUTE ONLY): 1330 SLP Time Calculation (min) (ACUTE ONLY): 30 min Past Medical History: Past Medical History: Diagnosis Date . Stroke [facility])  Past Surgical History: Past Surgical History: Procedure Laterality Date . ABDOMINAL SURGERY   . IR CT HEAD LTD  10/30/2019 . IR INTRA CRAN STENT  10/30/2019 . IR PERCUTANEOUS ART THROMBECTOMY/INFUSION INTRACRANIAL INC DIAG ANGIO  10/30/2019 . IR US GUIDE VASC ACCESS RIGHT  10/30/2019 . RADIOLOGY WITH ANESTHESIA N/A 10/30/2019  Procedure: IR WITH ANESTHESIA;  Surgeon: Gilmer Mor, DO;  Location: [facility] OR;  Service: Radiology;  Laterality: N/A; HPI: 74 y.o. male past medical history of mild residual right hemiparesis from a left cerebral hemispheric stroke  Patient presented to [place] [facility] for confusion/aphasia. Pt with aphasia, R gaze deviation and L weakness with possible neglect. CTA head/neck demonstrating R MCA occlusion. Pt underwent R MCA stenting on 5/18, intubated until 5/19.  MRI: Scattered small foci of restricted diffusion throughout the right MCA territory with a cluster at the temporal occipital region, consistent with acute/subacute infarcts. Remote infarcts in the bilateral corona radiata, bilateral basal ganglia and pons.  Subjective: Alert, cooperative Assessment / Plan / Recommendation CHL IP CLINICAL IMPRESSIONS 11/02/2019 Clinical Impression Pt presents  with a moderate oral phase dysphagia and a normal pharyngeal phase with excellent pharyngeal clearance and no penetration/aspiration, even when taxed with mixed solid/liquid consistencies.  Oral phase was marked by significant incoordination and poor bolus incohesion, anterior loss, residue and piecemeal swallowing.  Recommend advancing diet to dysphagia 2 with thin liquids; supervise to cue for pocketing/residue in oral cavity and anterior spillage.  Pills may be given whole in puree.  Pt is potentially D/Cing to CIR next date.  SLP Visit Diagnosis Dysphagia, oral phase (R13.11) Attention and concentration deficit following -- Frontal lobe and executive function deficit following -- Impact on safety and function --   CHL IP TREATMENT RECOMMENDATION 11/02/2019 Treatment Recommendations Therapy as outlined in treatment plan below   Prognosis 11/02/2019 Prognosis for Safe Diet Advancement Good Barriers to Reach Goals -- Barriers/Prognosis Comment -- CHL IP DIET RECOMMENDATION 11/02/2019 SLP Diet Recommendations Thin liquid;Dysphagia 2 (Fine chop) solids Liquid Administration via Cup;Straw Medication Administration Whole meds with puree Compensations Slow rate;Small sips/bites Postural Changes Remain semi-upright after after feeds/meals (Comment)   CHL IP OTHER RECOMMENDATIONS 11/02/2019 Recommended Consults -- Oral Care Recommendations Oral care BID Other Recommendations Have oral suction available   CHL IP FOLLOW UP RECOMMENDATIONS 11/02/2019 Follow up Recommendations Inpatient Rehab   CHL IP FREQUENCY AND DURATION 11/02/2019 Speech Therapy Frequency (ACUTE ONLY) min 2x/week Treatment Duration 1 week      CHL IP ORAL PHASE 11/02/2019 Oral Phase Impaired Oral - Pudding Teaspoon -- Oral - Pudding Cup -- Oral - Honey Teaspoon -- Oral - Honey Cup -- Oral - Nectar Teaspoon -- Oral - Nectar Cup -- Oral - Nectar Straw -- Oral - Thin Teaspoon -- Oral - Thin Cup -- Oral - Thin Straw  Right anterior bolus loss;Impaired  mastication;Weak lingual manipulation;Lingual pumping;Right pocketing in lateral sulci;Pocketing in anterior sulcus;Piecemeal swallowing;Delayed oral transit;Decreased bolus cohesion Oral - Puree Impaired mastication;Weak lingual manipulation;Lingual pumping;Right pocketing in lateral sulci;Pocketing in anterior sulcus;Piecemeal swallowing;Delayed oral transit;Decreased bolus cohesion Oral - Mech Soft -- Oral - Regular Impaired mastication;Weak lingual manipulation;Lingual pumping;Right pocketing in lateral sulci;Pocketing in anterior sulcus;Piecemeal swallowing;Delayed oral transit;Decreased bolus cohesion Oral - Multi-Consistency -- Oral - Pill -- Oral Phase - Comment --  CHL IP PHARYNGEAL PHASE 11/02/2019 Pharyngeal Phase WFL Pharyngeal- Pudding Teaspoon -- Pharyngeal -- Pharyngeal- Pudding Cup -- Pharyngeal -- Pharyngeal- Honey Teaspoon -- Pharyngeal -- Pharyngeal- Honey Cup -- Pharyngeal -- Pharyngeal- Nectar Teaspoon -- Pharyngeal -- Pharyngeal- Nectar Cup -- Pharyngeal -- Pharyngeal- Nectar Straw -- Pharyngeal -- Pharyngeal- Thin Teaspoon -- Pharyngeal -- Pharyngeal- Thin Cup -- Pharyngeal -- Pharyngeal- Thin Straw -- Pharyngeal -- Pharyngeal- Puree -- Pharyngeal -- Pharyngeal- Mechanical Soft -- Pharyngeal -- Pharyngeal- Regular -- Pharyngeal -- Pharyngeal- Multi-consistency -- Pharyngeal -- Pharyngeal- Pill -- Pharyngeal -- Pharyngeal Comment --  No flowsheet data found. Blenda Mounts Laurice 11/02/2019, 4:32 PM              Recent Labs    11/02/19 0431 11/03/19 0354  WBC 7.0 6.7  HGB 13.1 14.2  HCT 42.4 44.8  PLT 150 177   Recent Labs    11/02/19 0431 11/03/19 0354  NA 141 139  K 4.0 4.1  CL 107 104  CO2 24 27  GLUCOSE 110* 122*  BUN 10 10  CREATININE 0.86 0.97  CALCIUM 8.7* 9.1    Intake/Output Summary (Last 24 hours) at 11/04/2019 1252 Last data filed at 11/04/2019 0700 Gross per 24 hour  Intake 120 ml  Output 475 ml  Net -355 ml     Physical Exam: Vital Signs Blood  pressure 103/70, pulse 69, temperature 98.8 F (37.1 C), temperature source Oral, resp. rate 16, height 5\' 10"  (1.778 m), weight 91.5 kg, SpO2 100 %.  Physical Exam  Nursing note and vitals reviewed. Constitutional:  Appropriate, sitting up in bed; watching TV, NAD  HENT: oral apraxia noted; conjugate gaze Cardiovascular: RRR Respiratory: CTA B/L  GI: soft, but slightly distended; NT; (+)hypoactive BS   Musculoskeletal: .  UEs biceps 4-/5, triceps 4-/5, WE 4/5, grip 4/5, finger abd 3+/5 B/: LEs- HF 3-/5, KE 3+/5, DF R 2-/5 and L 3/5, PF 4-/5 Apraxia noted with strength exam as well  Neurological: He is alert.  Sensation intact to light touch x 4 extremities Apraxia oral, verbal and limb apraxia Sounds like talking with marbles in mouth Hard to understand still, maybe slightly easier?  Skin: Skin is warm and dry.  No skin breakdown seen L antecubital fossa IV- no infiltration Trace LE edema Some venous stasis changes below knees B/L Heels OK  Psychiatric:  Appropriate, but dysarthric     Assessment/Plan: 1. Functional deficits secondary to R MCA stroke which require 3+ hours per day of interdisciplinary therapy in a comprehensive inpatient rehab setting.  Physiatrist is providing close team supervision and 24 hour management of active medical problems listed below.  Physiatrist and rehab team continue to assess barriers to discharge/monitor patient progress toward functional and medical goals  Care Tool:  Bathing    Body parts bathed by patient: Right arm, Left arm, Chest, Abdomen, Front perineal area, Right upper leg, Left upper leg, Face   Body parts bathed by helper: Buttocks, Right lower leg, Left lower leg     Bathing assist Assist Level:  Moderate Assistance - Patient 50 - 74%     Upper Body Dressing/Undressing Upper body dressing   What is the patient wearing?: Pull over shirt    Upper body assist Assist Level: Minimal Assistance - Patient > 75%    Lower  Body Dressing/Undressing Lower body dressing      What is the patient wearing?: Incontinence brief, Pants     Lower body assist Assist for lower body dressing: Total Assistance - Patient < 25%     Toileting Toileting    Toileting assist Assist for toileting: Maximal Assistance - Patient 25 - 49%     Transfers Chair/bed transfer  Transfers assist           Locomotion Ambulation   Ambulation assist              Walk 10 feet activity   Assist           Walk 50 feet activity   Assist           Walk 150 feet activity   Assist           Walk 10 feet on uneven surface  activity   Assist           Wheelchair     Assist               Wheelchair 50 feet with 2 turns activity    Assist            Wheelchair 150 feet activity     Assist          Blood pressure 103/70, pulse 69, temperature 98.8 F (37.1 C), temperature source Oral, resp. rate 16, height 5\' 10"  (1.778 m), weight 91.5 kg, SpO2 100 %.  Medical Problem List and Plan: 1.  Left-sided weakness with dysarthria secondary to right MCA scattered and single punctate right cerebellar infarct with right M1 occlusion status post right M1 thrombectomy and stent as well as history of CVA with right-sided residual weakness             -patient may  shower             -ELOS/Goals: 2.5-3 weeks- Supervision 2.  Antithrombotics: -DVT/anticoagulation: Subcutaneous heparin- Cr is OK- should be able to do Lovenox- will check and see if can change.             -antiplatelet therapy: Aspirin 81 mg daily and Plavix 75 mg daily 3. Pain Management: Tylenol as needed 4. Mood: Provide emotional support             -antipsychotic agents: N/A 5. Neuropsych: This patient is capable of making decisions on his own behalf. 6. Skin/Wound Care: Routine skin checks 7. Fluids/Electrolytes/Nutrition: Routine in and outs with follow-up chemistries 8.  Dysphagia.  Follow-up speech  therapy.  Presently on dysphagia #2 thin liquid diet.  523- tolerating well- ate all breakfast 9.  Hypertension.  Lisinopril 40 mg daily 10.  Seizure prophylaxis.  Keppra 500 mg twice daily.  EEG negative 11.  Hyperlipidemia.  Lipitor 12.  Morbid obesity.  BMI 31.03.  Dietary follow-up 13.  New findings diabetes mellitus.  Hemoglobin A1c 7.4.  SSI. 14.  Tobacco abuse.  Counseling 15.  Medical noncompliance.  Counseling 16. Constipation  5/23- no BM since admission- started eating diet Friday- added senokot 2 tabs daily and has sorbitol prn.  17. Dry Skin  5/23- added eucerin and suggested to nursing to also use vaseline ~ 10 minutes after  eucerin to really moisturize skin.    LOS: 1 days A FACE TO FACE EVALUATION WAS PERFORMED  Raysean Graumann 11/04/2019, 12:52 PM

## 2019-11-04 NOTE — Evaluation (Signed)
Occupational Therapy Assessment and Plan  Patient Details  Name: Jeffery Reilly MRN: [id number] Date of Birth: September 13, 1945  OT Diagnosis: abnormal posture, apraxia, cognitive deficits, hemiplegia affecting dominant side and muscle weakness (generalized) Rehab Potential: Rehab Potential (ACUTE ONLY): Excellent ELOS: 2.5-3 weeks   Today's Date: 11/04/2019 OT Individual Time: 1000-1100 OT Individual Time Calculation (min): 60 min     Problem List:  Patient Active Problem List   Diagnosis Date Noted  . Right middle cerebral artery stroke ([place]) 11/03/2019  . Stroke [facility]) 10/30/2019    Past Medical History:  Past Medical History:  Diagnosis Date  . Stroke [facility])    Past Surgical History:  Past Surgical History:  Procedure Laterality Date  . ABDOMINAL SURGERY    . IR CT HEAD LTD  10/30/2019  . IR INTRA CRAN STENT  10/30/2019  . IR PERCUTANEOUS ART THROMBECTOMY/INFUSION INTRACRANIAL INC DIAG ANGIO  10/30/2019  . IR US GUIDE VASC ACCESS RIGHT  10/30/2019  . RADIOLOGY WITH ANESTHESIA N/A 10/30/2019   Procedure: IR WITH ANESTHESIA;  Surgeon: Corrie Mckusick, DO;  Location: [place];  Service: Radiology;  Laterality: N/A;    Assessment & Plan Clinical Impression: Jeffery Reilly is a 74 year old right-handed male with history of CVA with residual right-sided weakness, morbid obesity with BMI 31.03, tobacco abuse on no prescription medications and reported medical noncompliance.  Per chart review lives with spouse independent prior to admission but rather sedentary.  1 level home 4 steps to entry.  Presented 10/30/2019 to [facility] with aphasia as well as altered mental status and left-sided weakness as well as dysarthria.  Cranial CT scan showed advanced chronic small vessel disease no changes.  CT angiogram of head and neck distal right M1 MCA occlusion.  No hemodynamically significant stenosis in the neck.  Patient underwent emergent thrombectomy of right MCA M1 occlusion with rescue  stenting per Dr. Earleen Newport of interventional radiology.  A follow-up MRI showed scattered foci of restricted diffusion throughout the right MCA territory with a cluster at the temporal occipital region consistent with acute/subacute infarction.  Echocardiogram with ejection fraction of 00% grade 1 diastolic dysfunction.  There was report of possible seizure with increased body tone treated with Ativan and Keppra and currently maintained on Keppra 500 mg twice daily.  EEG negative for seizure but was suggestive of mild diffuse encephalopathy.  Admission chemistries unremarkable except glucose 131, urine drug screen negative, SARS coronavirus negative.  Patient is currently maintained on Plavix and aspirin for 3 to 6 months due to stent placement per neurology services.  Subcutaneous heparin for DVT prophylaxis with lower extremity Dopplers negative.  Noted dysphagia with speech therapy follow-up modified barium swallow placed on a dysphagia #2 thin liquid diet..  Therapy evaluations completed and patient was admitted for a comprehensive rehab program.   Patient currently requires Mod-Max with basic self-care skills secondary to muscle weakness, decreased cardiorespiratoy endurance, unbalanced muscle activation, decreased coordination and decreased motor planning, decreased problem solving and decreased sitting balance, decreased standing balance, decreased postural control and hemiplegia.  Prior to hospitalization, patient could complete BADLs with independent .  Patient will benefit from skilled intervention to increase independence with basic self-care skills prior to discharge home with spouse.  Anticipate patient will require 24 hour supervision and follow up home health.  OT - End of Session Endurance Deficit: Yes Endurance Deficit Description: requires rest breaks between activities OT Assessment Rehab Potential (ACUTE ONLY): Excellent OT Barriers to Discharge: Medical stability OT Patient demonstrates  impairments  in the following area(s): Balance;Safety;Cognition;Endurance;Motor OT Basic ADL's Functional Problem(s): Bathing;Dressing;Toileting OT Advanced ADL's Functional Problem(s): Simple Meal Preparation OT Transfers Functional Problem(s): Toilet;Tub/Shower OT Additional Impairment(s): None OT Plan OT Intensity: Minimum of 1-2 x/day, 45 to 90 minutes OT Frequency: 5 out of 7 days OT Duration/Estimated Length of Stay: 2.5-3 weeks OT Treatment/Interventions: Balance/vestibular training;Discharge planning;Pain management;Self Care/advanced ADL retraining;Therapeutic Activities;UE/LE Coordination activities;Therapeutic Exercise;Patient/family education;Functional mobility training;Disease mangement/prevention;Cognitive remediation/compensation;Community reintegration;DME/adaptive equipment instruction;Neuromuscular re-education;Psychosocial support;UE/LE Strength taining/ROM OT Self Feeding Anticipated Outcome(s): No goal OT Basic Self-Care Anticipated Outcome(s): Supervision OT Toileting Anticipated Outcome(s): Supervision OT Bathroom Transfers Anticipated Outcome(s): Supervision OT Recommendation Recommendations for Other Services: Therapeutic Recreation consult Therapeutic Recreation Interventions: [place] [employer] Patient destination: Home Follow Up Recommendations: Home health OT;24 hour supervision/assistance Equipment Recommended: To be determined  Skilled Therapeutic Intervention Skilled OT session completed with focus on initial evaluation, education on OT role/POC, and establishment of patient-centered goals.   Pt greeted in bed, requesting to use the restroom. Supine<sit completed with increased time, vcs, and HOB elevated. Mod A for sit<stand from elevated bed using RW with vcs for hand placement. Min A for ambulation to toilet with pt needing vcs to advance his feet Rt>Lt. He was unable to void. Max A for sit<stand from standard toilet, Min A for ambulation and pt once again  needing vcs for motor planning and increased time. He then engaged in bathing/dressing tasks sit<stand from EOB using RW. Pt required supervision for sitting balance most of the time though did note Lt lean in sitting and standing positions. Also posterior bias in standing with 1 posterior LOB. Pt required vcs for sequencing and Max A for LB self care tasks, CGA for dynamic sitting tasks. Mod A for sit<stands and for dynamic standing balance during perihygiene completion. Pt able to use both hands functionally, though he reports being Rt hand dominant and noted that today he used his Lt hand at the dominant level. He scooted up towards [facility] with supervision and transferred back to supine with assistance for his LEs. Left pt with all needs within reach and bed alarm set.   OT Evaluation Precautions/Restrictions  Precautions Precautions: Fall Precaution Comments: Mild Rt hemi Restrictions Weight Bearing Restrictions: No Pain: pt denied having pain during session    Home Living/Prior [place] expects to be discharged to:: Private residence Living Arrangements: Spouse/significant other Available Help at Discharge: Family, Available 24 hours/day Type of Home: House Home Access: Stairs to enter [employer] of Steps: 4-6 in the front with B rails, 2 at the side door without rails Entrance Stairs-Rails: Can reach both Home Layout: One level Bathroom Shower/Tub: Optometrist: Yes  Lives With: Spouse IADL History Homemaking Responsibilities: Yes Meal Prep Responsibility: Primary Homemaking Comments: Pt reports raising hogs, driving, and working in the yard PTA, spouse took care of cleaning needs 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 Prior Function Level of Independence: Independent with basic ADLs, Independent with homemaking with ambulation  Able to  [place]?: [place]: Yes Vocation: Retired Comments: pt ambulates household and limited community distances, raises hogs ADL ADL Eating: Not assessed Grooming: Supervision/safety Where Assessed-Grooming: Edge of bed Upper Body Bathing: Supervision/safety Where Assessed-Upper Body Bathing: Edge of bed Lower Body Bathing: Maximal assistance Where Assessed-Lower Body Bathing: Edge of bed Upper Body Dressing: Minimal assistance Where Assessed-Upper Body Dressing: Edge of bed Lower Body Dressing: Dependent Where Assessed-Lower Body Dressing: Edge of bed Toileting: Maximal assistance Where Assessed-Toileting:  Glass blower/designer: Moderate assistance Toilet Transfer Method: Ambulating(with RW) Tub/Shower Transfer: Not assessed Vision Baseline Vision/History: No visual deficits Patient Visual Report: No change from baseline Perception  Perception: Within Functional Limits Praxis Praxis: Impaired Praxis Impairment Details: Initiation;Motor planning Cognition Arousal/Alertness: Awake/alert Orientation Level: Person;Place;Situation Person: Oriented Place: Oriented Situation: Oriented Year: 2021 Month: May Day of Week: Correct Immediate Memory Recall: Sock;Blue;Bed Memory Recall Sock: Without Cue Memory Recall Blue: Without Cue Memory Recall Bed: Without Cue Attention: Sustained Sustained Attention: Impaired Problem Solving: Impaired Safety/Judgment: Appears intact Sensation Sensation Light Touch: Appears Intact Proprioception: Impaired Detail Proprioception Impaired Details: Impaired RUE Coordination Gross Motor Movements are Fluid and Coordinated: No Fine Motor Movements are Fluid and Coordinated: No Coordination and Movement Description: slow deliberate movements with decreased motor planning and slow initiation, mild R hemi Finger Nose Finger Test: Dysmetria Rt>Lt Heel Shin Test: unable to place R heel on shin, limited hip mobillity affecting  testing Motor  Motor Motor: Hemiplegia;Motor apraxia Mobility  Mod A functional toilet transfer using RW at ambulatory level  Trunk/Postural Assessment  Cervical Assessment Cervical Assessment: Exceptions to WFL(forward head) Thoracic Assessment Thoracic Assessment: Exceptions to WFL(rounded shoulders) Lumbar Assessment Lumbar Assessment: Exceptions to WFL(posterior pelvic tilt) Postural Control Postural Control: Deficits on evaluation(posterior bias in standing, Mild Lt lean during sitting and standing activity)  Balance Balance Balance Assessed: Yes Dynamic Sitting Balance Dynamic Sitting - Balance Support: During functional activity;Feet supported;No upper extremity supported(doffing gripper socks EOB) Dynamic Sitting - Level of Assistance: 4: Min assist Dynamic Sitting - Balance Activities: Lateral lean/weight shifting;Forward lean/weight shifting;Reaching for objects Dynamic Standing Balance Dynamic Standing - Balance Support: During functional activity;Right upper extremity supported Dynamic Standing - Level of Assistance: 3: Mod assist Dynamic Standing - Balance Activities: Lateral lean/weight shifting;Forward lean/weight shifting(perihygiene completion, standing with RW) Extremity/Trunk Assessment RUE Assessment RUE Assessment: Within Functional Limits Active Range of Motion (AROM) Comments: 90-110 degrees shoulder flexion and abduction, WNL other ranges General Strength Comments: 3+ to 4-/5 grossly LUE Assessment LUE Assessment: Within Functional Limits Active Range of Motion (AROM) Comments: 120-140 degrees shoulder flexion and abduction respectively, WNL other ranges General Strength Comments: 4/5 grossly    Refer to Care Plan for Long Term Goals  Recommendations for other services: Therapeutic Recreation  Kitchen group   Discharge Criteria: Patient will be discharged from OT if patient refuses treatment 3 consecutive times without medical reason, if treatment  goals not met, if there is a change in medical status, if patient makes no progress towards goals or if patient is discharged from hospital.  The above assessment, treatment plan, treatment alternatives and goals were discussed and mutually agreed upon: by patient  Skeet Simmer 11/04/2019, 4:33 PM

## 2019-11-04 NOTE — Plan of Care (Signed)
  Problem: RH Balance Goal: LTG Patient will maintain dynamic standing with ADLs (OT) Description: LTG:  Patient will maintain dynamic standing balance with assist during activities of daily living (OT)  Flowsheets (Taken 11/04/2019 1618) LTG: Pt will maintain dynamic standing balance during ADLs with: Supervision/Verbal cueing   Problem: Sit to Stand Goal: LTG:  Patient will perform sit to stand in prep for activites of daily living with assistance level (OT) Description: LTG:  Patient will perform sit to stand in prep for activites of daily living with assistance level (OT) Flowsheets (Taken 11/04/2019 1618) LTG: PT will perform sit to stand in prep for activites of daily living with assistance level: Supervision/Verbal cueing   Problem: RH Bathing Goal: LTG Patient will bathe all body parts with assist levels (OT) Description: LTG: Patient will bathe all body parts with assist levels (OT) Flowsheets (Taken 11/04/2019 1618) LTG: Pt will perform bathing with assistance level/cueing: Supervision/Verbal cueing   Problem: RH Dressing Goal: LTG Patient will perform upper body dressing (OT) Description: LTG Patient will perform upper body dressing with assist, with/without cues (OT). Flowsheets (Taken 11/04/2019 1618) LTG: Pt will perform upper body dressing with assistance level of: Supervision/Verbal cueing Goal: LTG Patient will perform lower body dressing w/assist (OT) Description: LTG: Patient will perform lower body dressing with assist, with/without cues in positioning using equipment (OT) Flowsheets (Taken 11/04/2019 1618) LTG: Pt will perform lower body dressing with assistance level of: (excluding Teds)  Supervision/Verbal cueing  Other (Comment)   Problem: RH Toileting Goal: LTG Patient will perform toileting task (3/3 steps) with assistance level (OT) Description: LTG: Patient will perform toileting task (3/3 steps) with assistance level (OT)  Flowsheets (Taken 11/04/2019  1618) LTG: Pt will perform toileting task (3/3 steps) with assistance level: Supervision/Verbal cueing   Problem: RH Toilet Transfers Goal: LTG Patient will perform toilet transfers w/assist (OT) Description: LTG: Patient will perform toilet transfers with assist, with/without cues using equipment (OT) Flowsheets (Taken 11/04/2019 1618) LTG: Pt will perform toilet transfers with assistance level of: Supervision/Verbal cueing   Problem: RH Tub/Shower Transfers Goal: LTG Patient will perform tub/shower transfers w/assist (OT) Description: LTG: Patient will perform tub/shower transfers with assist, with/without cues using equipment (OT) Flowsheets (Taken 11/04/2019 1618) LTG: Pt will perform tub/shower stall transfers with assistance level of: Supervision/Verbal cueing

## 2019-11-04 NOTE — Evaluation (Signed)
Physical Therapy Assessment and Plan  Patient Details  Name: Jeffery Reilly MRN: [id number] Date of Birth: 02/06/1946  PT Diagnosis: Abnormality of gait, Cognitive deficits, Coordination disorder, Difficulty walking, Hemiplegia dominant, Hypotonia, Impaired cognition and Muscle weakness Rehab Potential: Good ELOS: 2-3 weeks   Today's Date: 11/04/2019 PT Individual Time: 1300-1425 PT Individual Time Calculation (min): 85 min    Problem List:  Patient Active Problem List   Diagnosis Date Noted  . Right middle cerebral artery stroke ([place]) 11/03/2019  . Stroke [facility]) 10/30/2019    Past Medical History:  Past Medical History:  Diagnosis Date  . Stroke [facility])    Past Surgical History:  Past Surgical History:  Procedure Laterality Date  . ABDOMINAL SURGERY    . IR CT HEAD LTD  10/30/2019  . IR INTRA CRAN STENT  10/30/2019  . IR PERCUTANEOUS ART THROMBECTOMY/INFUSION INTRACRANIAL INC DIAG ANGIO  10/30/2019  . IR US GUIDE VASC ACCESS RIGHT  10/30/2019  . RADIOLOGY WITH ANESTHESIA N/A 10/30/2019   Procedure: IR WITH ANESTHESIA;  Surgeon: Corrie Mckusick, DO;  Location: [place];  Service: Radiology;  Laterality: N/A;    Assessment & Plan Clinical Impression: Patient is a 74 y.o. year old right-handed male with history of CVA with residual right-sided weakness, morbid obesity with BMI 31.03, tobacco abuse on no prescription medications and reported medical noncompliance.  Per chart review lives with spouse independent prior to admission but rather sedentary.  1 level home 4 steps to entry.  Presented 10/30/2019 to [facility] with aphasia as well as altered mental status and left-sided weakness as well as dysarthria.  Cranial CT scan showed advanced chronic small vessel disease no changes.  CT angiogram of head and neck distal right M1 MCA occlusion.  No hemodynamically significant stenosis in the neck.  Patient underwent emergent thrombectomy of right MCA M1 occlusion with rescue  stenting per Dr. Earleen Newport of interventional radiology.  A follow-up MRI showed scattered foci of restricted diffusion throughout the right MCA territory with a cluster at the temporal occipital region consistent with acute/subacute infarction.  Echocardiogram with ejection fraction of 09% grade 1 diastolic dysfunction.  There was report of possible seizure with increased body tone treated with Ativan and Keppra and currently maintained on Keppra 500 mg twice daily.  EEG negative for seizure but was suggestive of mild diffuse encephalopathy.  Admission chemistries unremarkable except glucose 131, urine drug screen negative, SARS coronavirus negative.  Patient is currently maintained on Plavix and aspirin for 3 to 6 months due to stent placement per neurology services.  Subcutaneous heparin for DVT prophylaxis with lower extremity Dopplers negative.  Noted dysphagia with speech therapy follow-up modified barium swallow placed on a dysphagia #2 thin liquid diet..  Therapy evaluations completed and patient was admitted for a comprehensive rehab program. Patient transferred to [facility] on 11/03/2019 .   Patient currently requires mod with mobility secondary to muscle weakness, decreased cardiorespiratoy endurance, impaired timing and sequencing, abnormal tone, motor apraxia, decreased coordination and decreased motor planning, decreased visual motor skills, decreased initiation, decreased attention, decreased awareness, decreased problem solving, decreased memory and delayed processing and decreased sitting balance, decreased standing balance, decreased postural control, hemiplegia and decreased balance strategies.  Prior to hospitalization, patient was independent  with mobility and lived with Spouse in a House home.  Home access is 4-6 in the front with B rails, 2 at the side door without railsStairs to enter.  Patient will benefit from skilled PT intervention to maximize safe functional  mobility, minimize fall risk and  decrease caregiver burden for planned discharge home with 24 hour supervision.  Anticipate patient will benefit from follow up OP at discharge.  PT - End of Session Activity Tolerance: Tolerates 30+ min activity with multiple rests Endurance Deficit: Yes Endurance Deficit Description: requires rest breaks between activities PT Assessment Rehab Potential (ACUTE/IP ONLY): Good PT Barriers to Discharge: Home environment access/layout;Medication compliance;Behavior PT Barriers to Discharge Comments: Patient has 4 STE home, hx of non-compliance with medications, new cognitive deficits limiting attention, incite into deficits, and motor planning PT Patient demonstrates impairments in the following area(s): Balance;Behavior;Edema;Motor;Nutrition;Endurance;Pain;Perception;Safety;Sensory;Skin Integrity PT Transfers Functional Problem(s): Bed Mobility;Bed to Chair;Furniture;Car;Floor PT Locomotion Functional Problem(s): Ambulation;Wheelchair Mobility;Stairs PT Plan PT Intensity: Minimum of 1-2 x/day ,45 to 90 minutes PT Frequency: 5 out of 7 days PT Duration Estimated Length of Stay: 2-3 weeks PT Treatment/Interventions: Ambulation/gait training;Community reintegration;DME/adaptive equipment instruction;Neuromuscular re-education;Psychosocial support;Stair training;UE/LE Strength taining/ROM;Wheelchair propulsion/positioning;Balance/vestibular training;Discharge planning;Functional electrical stimulation;Pain management;Skin care/wound management;Therapeutic Activities;UE/LE Coordination activities;Cognitive remediation/compensation;Disease management/prevention;Functional mobility training;Patient/family education;Splinting/orthotics;Therapeutic Exercise;Visual/perceptual remediation/compensation PT Transfers Anticipated Outcome(s): Supervision using LRAD PT Locomotion Anticipated Outcome(s): Supervision >200 ft using LRAD PT Recommendation Recommendations for Other Services: Neuropsych  consult;Therapeutic Recreation consult Therapeutic Recreation Interventions: Stress management Follow Up Recommendations: Outpatient PT Patient destination: Home Equipment Recommended: To be determined Equipment Details: Patient has RW and SPC, will determine other DME needs based on patient's progress  Skilled Therapeutic Intervention In addition to the PT evaluation below, the patient performed the following skilled PT interventions: Patient in bed finishing lunch with his daughter and RN in the room upon PT arrival. Patient alert and agreeable to PT session. Patient denied pain during session.  Therapeutic Activity: Bed Mobility: Patient performed rolling R/L and supine to/from sit with supervision and increased time due to poor motor planning. Transfers: Patient performed sit to/from stand and stand pivot with min A without an assistive device. Required increased time to boost up due to decreased motor planning. He then perform sit to/from stand x5 with min A using a RW with improved balance and timing. Provided verbal cues for hand placement on RW, forward weight shift, knee, hip, and trunk extension, and reaching back to sit for safety. Patient performed a simulated sedan height car transfer with mod A and increased time without an AD. Performed step-in technique, educated on safety risks with this technique due to decreased balance following transfer. Patient stated understanding.   Gait Training:  Patient ambulated 160 feet without an AD with min-mod A with significant R foot drag with fatigue. He then ambulated 160 feet using RW with min A for weight shifting for increased foot clearance with improved step height on R with intermittent R foot drag (worse with fatigue). Ambulated as described below. Provided verbal cues for weight shift for improved step height, increased DF and step height on R, erect posture, and increased step length. Patient ascended/descended 4 steps using B rails with  mod A. Performed step-to gait pattern leading with R while ascending and L while descending, self-selected sequencing by patient. Provided manual facilitation for pushing through R LE while ascending due to R LE weakness.  Wheelchair Mobility:  Patient propelled wheelchair 50 feet, limited by fatigue, with supervision and required total A for the remaining 100 feet. Propels with significantly slow speed and small strokes.  Patient in bed at end of session with breaks locked, bed alarm set, and all needs within reach.   Patient requires increased time with all mobility due to decreased motor planning and increased  time for patient education/communication due to dysarthria.   Instructed pt in results of PT evaluation as detailed below, PT POC, rehab potential, rehab goals, and discharge recommendations. Additionally discussed CIR's policies regarding fall safety and use of chair alarm and/or quick release belt. Pt verbalized understanding and in agreement. Will update pt's family members as they become available.    PT Evaluation Precautions/Restrictions Precautions Precautions: Fall Precaution Comments: Mild Rt hemi Restrictions Weight Bearing Restrictions: No Home Living/Prior Functioning Home Living Available Help at Discharge: Family;Available 24 hours/day Type of Home: House Home Access: Stairs to enter [employer] of Steps: 4-6 in the front with B rails, 2 at the side door without rails Entrance Stairs-Rails: Can reach both Home Layout: One level Bathroom Shower/Tub: Chiropodist: Standard Bathroom Accessibility: Yes  Lives With: Spouse Prior Function Level of Independence: Independent with basic ADLs;Independent with homemaking with ambulation  Able to Take Stairs?: Reciprically Driving: Yes Vocation: Retired Comments: pt ambulates household and limited community distances, raises 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 Range of  Motion: Within Functional Limits Alignment/Gaze Preference: Within Defined Limits Tracking/Visual Pursuits: Decreased smoothness of eye movement to LEFT superior field;Decreased smoothness of eye movement to LEFT inferior field;Decreased smoothness of eye movement to RIGHT superior field;Decreased smoothness of eye movement to RIGHT inferior field;Decreased smoothness of horizontal tracking Saccades: Undershoots;Additional eye shifts occurred during testing;Decreased speed of saccadic movement Perception Perception: Within Functional Limits Praxis Praxis: Impaired Praxis Impairment Details: Initiation;Motor planning  Cognition Arousal/Alertness: Awake/alert Orientation Level: Oriented to situation;Oriented to person;Oriented to place;Disoriented to time Attention: Sustained Sustained Attention: Impaired Sustained Attention Impairment: Verbal basic Memory: Impaired Memory Impairment: Storage deficit;Retrieval deficit;Decreased short term memory Decreased Short Term Memory: Verbal basic;Functional basic Awareness: Impaired Awareness Impairment: Intellectual impairment Problem Solving: Impaired Problem Solving Impairment: Functional basic;Verbal basic Executive Function: (all impaired due to lower level deficits) Safety/Judgment: Appears intact Sensation Sensation Light Touch: Appears Intact Proprioception: Impaired by gross assessment, R UE and LE Gross Motor Movements are Fluid and Coordinated: No Fine Motor Movements are Fluid and Coordinated: No Coordination and Movement Description: slow deliberate movements with decreased motor planning and slow initiation, mild R hemi Finger Nose Finger Test: Dysmetria Rt>Lt Heel Shin Test: unable to place R heel on shin, limited hip mobillity affecting testing Motor  Motor Motor: Hemiplegia;Motor apraxia  Mobility Bed Mobility Bed Mobility: Rolling Right;Rolling Left;Supine to Sit;Sit to Supine Rolling Right: Supervision/verbal  cueing Rolling Left: Supervision/Verbal cueing Supine to Sit: Supervision/Verbal cueing Sit to Supine: Supervision/Verbal cueing Transfers Transfers: Sit to Stand;Stand to Sit;Stand Pivot Transfers Sit to Stand: Minimal Assistance - Patient > 75% Stand to Sit: Minimal Assistance - Patient > 75% Stand Pivot Transfers: Minimal Assistance - Patient > 75% Stand Pivot Transfer Details: Manual facilitation for weight shifting Transfer (Assistive device): None Locomotion  Gait Ambulation: Yes Gait Assistance: Moderate Assistance - Patient 50-74% Gait Distance (Feet): 160 Feet Assistive device: 1 person hand held assist Gait Assistance Details: Manual facilitation for weight shifting Gait Gait: Yes Gait Pattern: Step-through pattern;Decreased stride length;Decreased stance time - right;Decreased hip/knee flexion - left;Decreased dorsiflexion - right;Decreased weight shift to right;Right foot flat;Right flexed knee in stance;Shuffle;Decreased trunk rotation;Narrow base of support;Poor foot clearance - right Gait velocity: decreased Stairs / Additional Locomotion Stairs: Yes Stairs Assistance: Moderate Assistance - Patient 50 - 74% Stair Management Technique: Two rails Number of Stairs: 4 Height of Stairs: 6 Wheelchair Mobility Wheelchair Mobility: Yes Wheelchair Assistance: Moderate Assistance - Patient 50 - 74% Wheelchair Propulsion: Both upper extremities  Wheelchair Parts Management: Needs assistance Distance: 50 feet supervision, remaining 100 feet total A due to decreased activity tolerance/attention to task  Trunk/Postural Assessment  Cervical Assessment Cervical Assessment: Exceptions to WFL(forward head) Thoracic Assessment Thoracic Assessment: Exceptions to WFL(rounded shoulders) Lumbar Assessment Lumbar Assessment: Exceptions to WFL(posterior pelvic tilt) Postural Control Postural Control: Deficits on evaluation(posterior bias in standing, Mild Lt lean during sitting and  standing activity)  Balance Balance Balance Assessed: Yes Static Sitting Balance Static Sitting - Balance Support: Right upper extremity supported;Left upper extremity supported;Feet supported Static Sitting - Level of Assistance: 5: Stand by assistance Dynamic Sitting Balance Dynamic Sitting - Balance Support: During functional activity;Feet supported;No upper extremity supported(doffing gripper socks EOB) Dynamic Sitting - Level of Assistance: 4: Min assist Dynamic Sitting - Balance Activities: Lateral lean/weight shifting;Forward lean/weight shifting;Reaching for objects Static Standing Balance Static Standing - Balance Support: No upper extremity supported;During functional activity Static Standing - Level of Assistance: 4: Min assist Dynamic Standing Balance Dynamic Standing - Balance Support: During functional activity;Right upper extremity supported Dynamic Standing - Level of Assistance: 3: Mod assist Dynamic Standing - Balance Activities: Lateral lean/weight shifting;Forward lean/weight shifting(perihygiene completion, standing with RW) Extremity Assessment  RUE Assessment RUE Assessment: Within Functional Limits Active Range of Motion (AROM) Comments: 90-110 degrees shoulder flexion and abduction, WNL other ranges General Strength Comments: 3+ to 4-/5 grossly LUE Assessment LUE Assessment: Within Functional Limits Active Range of Motion (AROM) Comments: 120-140 degrees shoulder flexion and abduction respectively, WNL other ranges General Strength Comments: 4/5 grossly RLE Assessment RLE Assessment: Exceptions to [facility] Active Range of Motion (AROM) Comments: decreased hip flexion in sitting, tight hamstrings and heel cords General Strength Comments: grossly in sitting: hip flexion 4/5, knee extension/flexion 4+/5, DF/PF 4-/5 LLE Assessment LLE Assessment: Within Functional Limits Active Range of Motion (AROM) Comments: WFL for all functional mobility, decreased hip flexion in  sitting, tight hamstrings and heel cords General Strength Comments: Grossly in sitting 5/5 throughout    Refer to Care Plan for Long Term Goals  Recommendations for other services: Neuropsych and Therapeutic Recreation  Stress management  Discharge Criteria: Patient will be discharged from PT if patient refuses treatment 3 consecutive times without medical reason, if treatment goals not met, if there is a change in medical status, if patient makes no progress towards goals or if patient is discharged from hospital.  The above assessment, treatment plan, treatment alternatives and goals were discussed and mutually agreed upon: by patient  Doreene Burke PT, DPT  11/04/2019, 4:30 PM

## 2019-11-04 NOTE — Plan of Care (Signed)
  Problem: RH BOWEL ELIMINATION Goal: RH STG MANAGE BOWEL WITH ASSISTANCE Description: STG Manage Bowel with moderate Assistance. Outcome: Not Progressing; constipation ; LBM 5/18 sorbitol started   Problem: RH BLADDER ELIMINATION Goal: RH STG MANAGE BLADDER WITH ASSISTANCE Description: STG Manage Bladder With moderate Assistance Outcome: Not Progressing; incontinence

## 2019-11-04 NOTE — Evaluation (Signed)
Speech Language Pathology Assessment and Plan  Patient Details  Name: Jeffery Reilly MRN: [id number] Date of Birth: 31-May-1946  SLP Diagnosis: Dysarthria;Dysphagia;Cognitive Impairments  Rehab Potential: Good ELOS: 2.5-3 weeks    Today's Date: 11/04/2019 SLP Individual Time: 7588-3254 SLP Individual Time Calculation (min): 55 min   Problem List:  Patient Active Problem List   Diagnosis Date Noted  . Right middle cerebral artery stroke ([place]) 11/03/2019  . Stroke [facility]) 10/30/2019   Past Medical History:  Past Medical History:  Diagnosis Date  . Stroke [facility])    Past Surgical History:  Past Surgical History:  Procedure Laterality Date  . ABDOMINAL SURGERY    . IR CT HEAD LTD  10/30/2019  . IR INTRA CRAN STENT  10/30/2019  . IR PERCUTANEOUS ART THROMBECTOMY/INFUSION INTRACRANIAL INC DIAG ANGIO  10/30/2019  . IR US GUIDE VASC ACCESS RIGHT  10/30/2019  . RADIOLOGY WITH ANESTHESIA N/A 10/30/2019   Procedure: IR WITH ANESTHESIA;  Surgeon: Corrie Mckusick, DO;  Location: [place];  Service: Radiology;  Laterality: N/A;    Assessment / Plan / Recommendation Clinical Impression   HPI: Jeffery Reilly is a 74 year old right-handed male with history of CVA with residual right-sided weakness, morbid obesity with BMI 31.03, tobacco abuse on no prescription medications and reported medical noncompliance.  Per chart review lives with spouse independent prior to admission but rather sedentary.  1 level home 4 steps to entry.  Presented 10/30/2019 to [facility] with aphasia as well as altered mental status and left-sided weakness as well as dysarthria.  Cranial CT scan showed advanced chronic small vessel disease no changes.  CT angiogram of head and neck distal right M1 MCA occlusion.  No hemodynamically significant stenosis in the neck.  Patient underwent emergent thrombectomy of right MCA M1 occlusion with rescue stenting per Dr. Earleen Newport of interventional radiology.  A follow-up MRI showed  scattered foci of restricted diffusion throughout the right MCA territory with a cluster at the temporal occipital region consistent with acute/subacute infarction.  Echocardiogram with ejection fraction of 98% grade 1 diastolic dysfunction.  There was report of possible seizure with increased body tone treated with Ativan and Keppra and currently maintained on Keppra 500 mg twice daily.  EEG negative for seizure but was suggestive of mild diffuse encephalopathy.  Admission chemistries unremarkable except glucose 131, urine drug screen negative, SARS coronavirus negative.  Patient is currently maintained on Plavix and aspirin for 3 to 6 months due to stent placement per neurology services.  Subcutaneous heparin for DVT prophylaxis with lower extremity Dopplers negative.  Noted dysphagia with speech therapy follow-up modified barium swallow placed on a dysphagia #2 thin liquid diet..  Therapy evaluations completed and patient was admitted for a comprehensive rehab program 11/03/19 and SLP evaluations completed 11/04/19 with results as follows:  Clinical bedside swallow evaluation: Pt presents with s/sx of moderate oral dysphagia. Pt found to be upright in bed consuming a regular texture breakfast tray (although last ST recommendations indicated a Dysphagia 2 texture recommendation). Pt demonstrated significantly prolonged mastication of regular textured bacon and english muffin, and also required cues for self-monitoring when to swallow prior to adding addition PO to oral cavity. Pt's mastication of purees and dysphagia 2 solids was much more efficient, although mastication still slightly prolonged. Piecemeal swallowing noted with solids of all textures. One delayed cough noted following several sips of thin via straw, however difficult to differentiate if it was directly linked to PO intake. Recommend pt return to Dysphagia 2 carb  modified diet, continue thin liquids, medications may be given whole in puree, and pt  should have full supervision to ensure use of compensatory swallow strategies and aid in sustained attention to intake.   Cognitive-linguistic evaluation: Pt presents with cognitive impairments with a moderate impact of daily functioning characterized by decreased intellectual awareness, basic problem solving, sustained attention, working and short term memory, as well as reasoning/judgement. Pt also with right gaze preference throughout evaluation. He was oriented to self, place, and situation, but disoriented to time. Although no family was present to confirm baseline, with cueing pt able to indicate he had baseline dysarthria after previous CVA, however he is highly unintelligible (~50-60% phrase and sentence level), and suspect baseline deficits are exacerbated in setting of new CVA. Expressive and receptive language appeared [facility]. Cognistat scores that fell outside of normal limits included  Orientation (7-mild), Attention (3-moderate), Visual construction (0-severe), memory (0-severe), Calculations (0-severe), Similarities (4-mild), and Judgement (2-moderate).   Recommend pt receive skilled ST services to address aforementioned dysphagia, dysarthria, and cognitive impairments in order to maximize his functional communication, safety, and independence prior to discharge.    Skilled Therapeutic Interventions          Bedside swallow and cognitive-linguistic evaluations were administered and results were reviewed with pt (please see above for details regarding results).    SLP Assessment  Patient will need skilled Speech Lanaguage Pathology Services during CIR admission    Recommendations  SLP Diet Recommendations: Thin;Dysphagia 2 (Fine chop) Medication Administration: Whole meds with puree Supervision: Patient able to self feed;Full supervision/cueing for compensatory strategies Compensations: Slow rate;Small sips/bites;Minimize environmental distractions Postural Changes and/or Swallow  Maneuvers: Seated upright 90 degrees;Upright 30-60 min after meal Oral Care Recommendations: Oral care BID Patient destination: Home Follow up Recommendations: Home Health SLP;24 hour supervision/assistance Equipment Recommended: None recommended by SLP    SLP Frequency 3 to 5 out of 7 days   SLP Duration  SLP Intensity  SLP Treatment/Interventions 2.5-3 weeks  Minumum of 1-2 x/day, 30 to 90 minutes  Cognitive remediation/compensation;Cueing hierarchy;Dysphagia/aspiration precaution training;Functional tasks;Patient/family education;Internal/external aids;Speech/Language facilitation    Pain Pain Assessment Pain Scale: 0-10 Pain Score: 0-No pain     SLP Evaluation Cognition Arousal/Alertness: Awake/alert Orientation Level: Oriented to situation;Oriented to person;Oriented to place;Disoriented to time Attention: Sustained Sustained Attention: Impaired Sustained Attention Impairment: Verbal basic Memory: Impaired Memory Impairment: Storage deficit;Retrieval deficit;Decreased short term memory Decreased Short Term Memory: Verbal basic;Functional basic Awareness: Impaired Awareness Impairment: Intellectual impairment Problem Solving: Impaired Problem Solving Impairment: Functional basic;Verbal basic Executive Function: (all impaired due to lower level deficits) Safety/Judgment: Impaired  Comprehension Auditory Comprehension Overall Auditory Comprehension: Appears within functional limits for tasks assessed Yes/No Questions: Within Functional Limits Commands: Within Functional Limits Conversation: Simple Visual Recognition/Discrimination Discrimination: Not tested Reading Comprehension Reading Status: Not tested Expression Expression Primary Mode of Expression: Verbal Verbal Expression Overall Verbal Expression: Appears within functional limits for tasks assessed Written Expression Written Expression: Not tested Oral Motor Oral Motor/Sensory Function Overall Oral  Motor/Sensory Function: Moderate impairment Facial ROM: Reduced right;Suspected CN VII (facial) dysfunction Facial Symmetry: Abnormal symmetry right;Suspected CN VII (facial) dysfunction Facial Strength: Reduced right;Suspected CN VII (facial) dysfunction Lingual ROM: Reduced right;Suspected CN XII (hypoglossal) dysfunction Lingual Symmetry: Abnormal symmetry right;Suspected CN XII (hypoglossal) dysfunction Lingual Strength: Reduced;Suspected CN XII (hypoglossal) dysfunction Velum: Within Functional Limits Mandible: Within Functional Limits Motor Speech Overall Motor Speech: Impaired Respiration: Within functional limits Phonation: Normal Resonance: Within functional limits Articulation: Impaired Level of Impairment: Word Intelligibility: Intelligibility reduced Word: 50-74%  accurate Phrase: 50-74% accurate Sentence: 50-74% accurate Conversation: 25-49% accurate Motor Planning: Witnin functional limits Motor Speech Errors: Not applicable Effective Techniques: Increased vocal intensity   Intelligibility: Intelligibility reduced Word: 50-74% accurate Phrase: 50-74% accurate Sentence: 50-74% accurate Conversation: 25-49% accurate  Bedside Swallowing Assessment General Previous Swallow Assessment: MBSS 11/02/19 Diet Prior to this Study: Regular;Thin liquids Temperature Spikes Noted: N/A Respiratory Status: Room air History of Recent Intubation: Yes Length of Intubations (days): 2 days Date extubated: 10/31/19 Behavior/Cognition: Alert;Cooperative Oral Cavity - Dentition: Edentulous Self-Feeding Abilities: Able to feed self Vision: Functional for self-feeding Baseline Vocal Quality: Normal Volitional Cough: Congested Volitional Swallow: Unable to elicit  Oral Care Assessment Does patient have any of the following "high(er) risk" factors?: None of the above Does patient have any of the following "at risk" factors?: Other - dysphagia Patient is AT RISK: Order set for Adult  Oral Care Protocol initiated -  "At Risk Patients" option selected (see row information) Patient is LOW RISK: Follow universal precautions (see row information) Ice Chips Ice chips: Not tested Thin Liquid Thin Liquid: Impaired Presentation: Straw Pharyngeal  Phase Impairments: Cough - Delayed Nectar Thick Nectar Thick Liquid: Not tested Honey Thick Honey Thick Liquid: Not tested Puree Puree: Impaired Presentation: Spoon;Self Fed Oral Phase Functional Implications: Prolonged oral transit;Right anterior spillage Solid Solid: Impaired Oral Phase Impairments: Impaired mastication;Reduced lingual movement/coordination Oral Phase Functional Implications: Prolonged oral transit;Impaired mastication BSE Assessment Risk for Aspiration Impact on safety and function: Mild aspiration risk Other Related Risk Factors: History of dysphagia;Cognitive impairment  Short Term Goals: Week 1: SLP Short Term Goal 1 (Week 1): Pt will consume current diet, demonstrating efficient mastication and oral clearance with Mod A cues for ues of compensatory swallow strategies. SLP Short Term Goal 2 (Week 1): Pt will consume trials of upgraded dysphagia 3 solids with efficient mastication and oral clearance X3 prior to advancement. SLP Short Term Goal 3 (Week 1): Pt will use strategies to increase intelligibility to 60% at the phrase level with Max A cues. SLP Short Term Goal 4 (Week 1): Pt will demonstrate ability to recall new and/or daily information with Max A for use of compensatory strategies and/or aids. SLP Short Term Goal 5 (Week 1): Pt will demonstrate ability to problem solve functional basic situaiton with Mod A verbal/visual cues. SLP Short Term Goal 6 (Week 1): Pt will identify acute impairments (1 physical and 1 cognitive impairment) impacting his daily functioning with Max A cues.  Refer to Care Plan for Long Term Goals  Recommendations for other services: None   Discharge Criteria: Patient will  be discharged from SLP if patient refuses treatment 3 consecutive times without medical reason, if treatment goals not met, if there is a change in medical status, if patient makes no progress towards goals or if patient is discharged from hospital.  The above assessment, treatment plan, treatment alternatives and goals were discussed and mutually agreed upon: by patient  Arbutus Leas 11/04/2019, 12:28 PM

## 2019-11-05 ENCOUNTER — Encounter (HOSPITAL_COMMUNITY): Payer: Self-pay | Admitting: Physical Medicine & Rehabilitation

## 2019-11-05 ENCOUNTER — Inpatient Hospital Stay (HOSPITAL_COMMUNITY): Payer: Medicare Other | Admitting: Speech Pathology

## 2019-11-05 ENCOUNTER — Inpatient Hospital Stay (HOSPITAL_COMMUNITY): Payer: Medicare Other

## 2019-11-05 ENCOUNTER — Inpatient Hospital Stay (HOSPITAL_COMMUNITY): Payer: Medicare Other | Admitting: Physical Therapy

## 2019-11-05 DIAGNOSIS — R1312 Dysphagia, oropharyngeal phase: Secondary | ICD-10-CM

## 2019-11-05 DIAGNOSIS — I639 Cerebral infarction, unspecified: Secondary | ICD-10-CM

## 2019-11-05 DIAGNOSIS — I10 Essential (primary) hypertension: Secondary | ICD-10-CM

## 2019-11-05 LAB — CBC WITH DIFFERENTIAL/PLATELET
Abs Immature Granulocytes: 0.02 10*3/uL (ref 0.00–0.07)
Basophils Absolute: 0 10*3/uL (ref 0.0–0.1)
Basophils Relative: 1 %
Eosinophils Absolute: 0.1 10*3/uL (ref 0.0–0.5)
Eosinophils Relative: 2 %
HCT: 46.2 % (ref 39.0–52.0)
Hemoglobin: 14.4 g/dL (ref 13.0–17.0)
Immature Granulocytes: 0 %
Lymphocytes Relative: 27 %
Lymphs Abs: 1.5 10*3/uL (ref 0.7–4.0)
MCH: 27.2 pg (ref 26.0–34.0)
MCHC: 31.2 g/dL (ref 30.0–36.0)
MCV: 87.2 fL (ref 80.0–100.0)
Monocytes Absolute: 0.7 10*3/uL (ref 0.1–1.0)
Monocytes Relative: 12 %
Neutro Abs: 3.3 10*3/uL (ref 1.7–7.7)
Neutrophils Relative %: 58 %
Platelets: 189 10*3/uL (ref 150–400)
RBC: 5.3 MIL/uL (ref 4.22–5.81)
RDW: 13.6 % (ref 11.5–15.5)
WBC: 5.7 10*3/uL (ref 4.0–10.5)
nRBC: 0 % (ref 0.0–0.2)

## 2019-11-05 LAB — COMPREHENSIVE METABOLIC PANEL
ALT: 54 U/L — ABNORMAL HIGH (ref 0–44)
AST: 49 U/L — ABNORMAL HIGH (ref 15–41)
Albumin: 3 g/dL — ABNORMAL LOW (ref 3.5–5.0)
Alkaline Phosphatase: 85 U/L (ref 38–126)
Anion gap: 11 (ref 5–15)
BUN: 19 mg/dL (ref 8–23)
CO2: 22 mmol/L (ref 22–32)
Calcium: 8.8 mg/dL — ABNORMAL LOW (ref 8.9–10.3)
Chloride: 104 mmol/L (ref 98–111)
Creatinine, Ser: 1.02 mg/dL (ref 0.61–1.24)
GFR calc Af Amer: >60 mL/min (ref >60)
GFR calc non Af Amer: >60 mL/min (ref >60)
Glucose, Bld: 130 mg/dL — ABNORMAL HIGH (ref 70–99)
Potassium: 4.3 mmol/L (ref 3.5–5.1)
Sodium: 137 mmol/L (ref 135–145)
Total Bilirubin: 1 mg/dL (ref 0.3–1.2)
Total Protein: 6.6 g/dL (ref 6.5–8.1)

## 2019-11-05 LAB — GLUCOSE, CAPILLARY
Glucose-Capillary: 116 mg/dL — ABNORMAL HIGH (ref 70–99)
Glucose-Capillary: 165 mg/dL — ABNORMAL HIGH (ref 70–99)
Glucose-Capillary: 118 mg/dL — ABNORMAL HIGH (ref 70–99)
Glucose-Capillary: 107 mg/dL — ABNORMAL HIGH (ref 70–99)

## 2019-11-05 MED ORDER — ENSURE ENLIVE PO LIQD
237.0000 mL | Freq: Two times a day (BID) | ORAL | Status: DC
  Administered 2019-11-05 – 2019-11-16 (×17): 237 mL via ORAL

## 2019-11-05 MED ORDER — ADULT MULTIVITAMIN W/MINERALS CH
1.0000 | ORAL_TABLET | Freq: Every day | ORAL | Status: DC
  Administered 2019-11-06 – 2019-11-16 (×11): 1 via ORAL
  Filled 2019-11-05 (×12): qty 1

## 2019-11-05 NOTE — Progress Notes (Signed)
PMR Admission Coordinator Pre-Admission Assessment   Patient: Jeffery Reilly is an 74 y.o., male MRN: [id number] DOB: 06/30/1945 Height: _0  (177.8 cm) Weight:                                                                                                                                                    Insurance Information HMO:     PPO:  PCP:      IPA:      80/20:      OTHER:  PRIMARY: medicare a and b      Policy#: [id number]      Subscriber: pt CM Name:       Phone#:      Fax#:  Pre-Cert#: verified Civil engineer, contracting:  Benefits:  Phone #:      Name:  Eff. Date: A 11/12/01, B 08/13/10     Deduct: $1484      Out of Pocket Max: n/a      Life Max: n/a  CIR: 100%      SNF: 20 full days Outpatient: 80%     Co-Pay: 20% Home Health: 100%      Co-Pay:  DME: 80%     Co-Pay: 20% Providers:  SECONDARY:       Policy#:       Phone#:    Development worker, community:       Phone#:    The Therapist, art Information Summary" for patients in Inpatient Rehabilitation Facilities with attached "Privacy Act [place] Records" was provided and verbally reviewed with: Patient and Family   Emergency Contact Information Contact Information     Name Relation Home Work Mobile    Yeakle, helena Spouse     [phone]       Current Medical History  Patient Admitting Diagnosis: CVA   History of Present Illness: Jeffery Reilly is a 74 year old right-handed male with history of CVA with residual right-sided weakness, morbid obesity with BMI 31.03, tobacco abuse on no prescription medications and reported medical noncompliance.  Per chart review lives with spouse independent prior to admission but rather sedentary.  1 level home 4 steps to entry.  Presented 10/30/2019 to [facility] with aphasia as well as altered mental status and left-sided weakness as well as dysarthria.  Cranial CT scan showed advanced chronic small vessel disease no changes.  CT angiogram of head and neck distal  right M1 MCA occlusion.  No hemodynamically significant stenosis in the neck.  Patient underwent emergent thrombectomy of right MCA M1 occlusion with rescue stenting per Dr. Earleen Newport of interventional radiology.  A follow-up MRI showed scattered foci of restricted diffusion throughout the right MCA territory with a cluster at the temporal occipital region consistent with acute/subacute infarction.  Echocardiogram with ejection fraction of 38% grade 1 diastolic dysfunction.  There was report  history includes Hypertension in his father and mother.  Prior Rehab/Hospitalizations:  Has the patient had prior rehab or hospitalizations prior to admission? No  Has the patient had major surgery during 100 days prior to admission? Yes  Current Medications   Current Facility-Administered Medications:  .   stroke: mapping our early stages of recovery book, , Does not apply, Once, Smith, David R, PA-C .  0.9 %  sodium chloride infusion, , Intravenous, Continuous, Xu, Jindong, MD, Stopped at 11/02/19 0850 .  0.9 %  sodium chloride infusion, 250 mL, Intravenous, Continuous, Arora, Ashish, MD .  acetaminophen (TYLENOL) tablet 650 mg, 650 mg, Oral, Q4H PRN **OR** acetaminophen  (TYLENOL) 160 MG/5ML solution 650 mg, 650 mg, Per Tube, Q4H PRN **OR** acetaminophen (TYLENOL) suppository 650 mg, 650 mg, Rectal, Q4H PRN, Smith, David R, PA-C, 650 mg at 10/31/19 1834 .  aspirin chewable tablet 81 mg, 81 mg, Oral, Daily, 81 mg at 11/02/19 1016 **OR** [DISCONTINUED] aspirin chewable tablet 81 mg, 81 mg, Per Tube, Daily, Wagner, Jaime, DO, 81 mg at 10/31/19 1058 .  atorvastatin (LIPITOR) tablet 80 mg, 80 mg, Oral, Daily, Patton, Karen K, RPH, 80 mg at 11/02/19 1016 .  Chlorhexidine Gluconate Cloth 2 % PADS 6 each, 6 each, Topical, Daily, Arora, Ashish, MD, 6 each at 11/01/19 1200 .  clopidogrel (PLAVIX) tablet 75 mg, 75 mg, Oral, Daily, 75 mg at 11/02/19 1016 **OR** [DISCONTINUED] clopidogrel (PLAVIX) tablet 75 mg, 75 mg, Per Tube, Daily, Wagner, Jaime, DO, 75 mg at 10/31/19 1100 .  heparin injection 5,000 Units, 5,000 Units, Subcutaneous, Q8H, Agarwala, Ravi, MD, 5,000 Units at 11/02/19 0533 .  hydrALAZINE (APRESOLINE) injection 5-10 mg, 5-10 mg, Intravenous, Q4H PRN, Xu, Jindong, MD .  insulin aspart (novoLOG) injection 0-6 Units, 0-6 Units, Subcutaneous, Q4H, Xu, Jindong, MD .  levETIRAcetam (KEPPRA) tablet 500 mg, 500 mg, Oral, BID, Xu, Jindong, MD, 500 mg at 11/02/19 1016 .  [START ON 11/03/2019] lisinopril (ZESTRIL) tablet 20 mg, 20 mg, Oral, Daily, Xu, Jindong, MD .  senna-docusate (Senokot-S) tablet 1 tablet, 1 tablet, Per Tube, QHS PRN, Xu, Jindong, MD  Patients Current Diet:  Diet Order            DIET DYS 2 Room service appropriate? Yes with Assist; Fluid consistency: Thin  Diet effective now              Precautions / Restrictions Precautions Precautions: Fall Restrictions Weight Bearing Restrictions: No   Has the patient had 2 or more falls or a fall with injury in the past year?No  Prior Activity Level Community (5-7x/wk): retired, working in the yard during the days, still driving, no DME used  Prior Functional Level Prior Function Level of  Independence: Independent Comments: pt ambulates household and limited community distances, raises hogs  Self Care: Did the patient need help bathing, dressing, using the toilet or eating?  Independent  Indoor Mobility: Did the patient need assistance with walking from room to room (with or without device)? Independent  Stairs: Did the patient need assistance with internal or external stairs (with or without device)? Independent  Functional Cognition: Did the patient need help planning regular tasks such as shopping or remembering to take medications? Independent  Home Assistive Devices / Equipment Home Assistive Devices/Equipment: None Home Equipment: Cane - single point, Shower seat  Prior Device Use: Indicate devices/aids used by the patient prior to current illness, exacerbation or injury? None of the above  Current Functional Level Cognition  Arousal/Alertness: Awake/alert Overall Cognitive   had 2 or more falls or a fall with injury in the past year?No    Prior Activity Level Community (5-7x/wk): retired, working in the yard during the days, still driving, no DME used   Prior Functional Level Prior Function Level of Independence: Independent Comments: pt ambulates household and limited community distances, raises hogs   Self Care: Did the patient need help bathing, dressing, using the toilet or eating?  Independent   Indoor Mobility: Did the patient need assistance with walking from room to room (with or without device)? Independent   Stairs: Did the patient need assistance with internal or external stairs (with or without device)? Independent   Functional Cognition: Did the patient need help planning regular tasks such as shopping or remembering to take medications? Independent   Home Assistive Devices / Equipment Home Assistive Devices/Equipment: None Home Equipment: Cane - single point, Shower seat   Prior Device Use: Indicate devices/aids used by the patient prior to current illness, exacerbation or injury? None of the above   Current Functional Level Cognition   Arousal/Alertness: Awake/alert Overall Cognitive Status: Impaired/Different from baseline Current Attention Level: Sustained Orientation Level: Oriented to person, Oriented to place, Disoriented to time, Disoriented to situation Following Commands: Follows one step commands consistently Safety/Judgement: Decreased awareness of safety, Decreased awareness of deficits Attention: Focused, Sustained, Selective Focused Attention: Appears intact Sustained Attention: Appears intact Selective Attention: Impaired Selective Attention Impairment: Verbal basic, Functional basic Memory: (not tested) Awareness: Impaired Awareness Impairment: Emergent impairment Problem Solving: Impaired Problem Solving Impairment: Functional basic Executive Function: Self Monitoring Self Monitoring: Impaired Self Monitoring Impairment: Verbal basic, Functional basic    Extremity  Assessment (includes Sensation/Coordination)   Upper Extremity Assessment: RUE deficits/detail, LUE deficits/detail RUE Deficits / Details: ? RUE deficits at baeline; dysmetric movemetns but uses functionally RUE Coordination: decreased fine motor LUE Deficits / Details: moving in isolated movement patterns; genrealized weakness LUE Coordination: decreased fine motor  Lower Extremity Assessment: Defer to PT evaluation     ADLs   Overall ADL's : Needs assistance/impaired Eating/Feeding: NPO Grooming: Minimal assistance, Sitting Upper Body Bathing: Minimal assistance, Sitting Lower Body Bathing: Maximal assistance, Sit to/from stand Upper Body Dressing : Moderate assistance, Sitting Upper Body Dressing Details (indicate cue type and reason): unaware that LUE was not in gown Lower Body Dressing: Maximal assistance, Sit to/from stand Toilet Transfer: Moderate assistance, Stand-pivot Toileting- Clothing Manipulation and Hygiene: Maximal assistance Functional mobility during ADLs: Moderate assistance, Rolling walker, Cueing for safety, Cueing for sequencing     Mobility   Overal bed mobility: Needs Assistance Bed Mobility: Supine to Sit, Sit to Supine Supine to sit: Min assist Sit to supine: Min assist     Transfers   Overall transfer level: Needs assistance Equipment used: Rolling walker (2 wheeled) Transfers: Sit to/from Stand Sit to Stand: Min assist, Mod assist Stand pivot transfers: Mod assist, +2 safety/equipment General transfer comment: modA with fatigue. Pt requires cues for hand placement     Ambulation / Gait / Stairs / Wheelchair Mobility   Ambulation/Gait Ambulation/Gait assistance: Min Web designer (Feet): 40 Feet(additional 20' and 5') Assistive device: Rolling walker (2 wheeled) Gait Pattern/deviations: Step-to pattern, Shuffle, Trunk flexed General Gait Details: pt with short shuffling steps despite PT cues for increased step length and foot clearance. PT  provides cues to maintain BOS within RW Gait velocity: reduced Gait velocity interpretation: <1.8 ft/sec, indicate of risk for recurrent falls     Posture / Balance Dynamic Sitting Balance Sitting balance - Comments: minG  at edge of bed Balance Overall balance assessment: Needs assistance Sitting-balance support: Single extremity supported, Feet supported Sitting balance-Leahy Scale: Fair Sitting balance - Comments: minG at edge of bed Postural control: Left lateral lean Standing balance support: Bilateral upper extremity supported Standing balance-Leahy Scale: Fair Standing balance comment: minG with BUE support of RW     Special needs/care consideration Diabetic management yes and Designated visitor wife [place] (from acute therapy documentation) Living Arrangements: Spouse/significant other  Lives With: Spouse Available Help at Discharge: Family, Available 24 hours/day Type of Home: House Home Layout: One level Home Access: Stairs to enter Entrance Stairs-Rails: Can reach both Entrance Stairs-Number of Steps: 4 Bathroom Shower/Tub: Optometrist: Yes How Accessible: Accessible via walker [place]: No   Discharge Living Setting Plans for Discharge Living Setting: Patient's home Type of Home at Discharge: House Discharge Home Layout: One level Discharge Home Access: Stairs to enter Entrance Stairs-Rails: Can reach both Entrance Stairs-Number of Steps: 4 Discharge Bathroom Shower/Tub: [place] unit Discharge Bathroom Toilet: Standard Discharge Bathroom Accessibility: Yes How Accessible: Accessible via walker Does the patient have any problems obtaining your medications?: No   Social/Family/Support Systems Patient Roles: Spouse Anticipated Caregiver: spouse Senay Sistrunk) Anticipated Caregiver's Contact Information: [phone] Ability/Limitations of Caregiver:  supervision Caregiver Availability: 24/7 Discharge Plan Discussed with Primary Caregiver: Yes Is Caregiver In Agreement with Plan?: Yes Does Caregiver/Family have Issues with Lodging/Transportation while Pt is in Rehab?: No     Goals Patient/Family Goal for Rehab: PT/OT/SLP supervision Expected length of stay: 10-14 days Additional Information: pts family has been rotating >2 visitors; I have explained they will only be allowed 2 visitors while on CIR Pt/Family Agrees to Admission and willing to participate: Yes Program Orientation Provided & Reviewed with Pt/Caregiver Including Roles  & Responsibilities: Yes     Decrease burden of Care through IP rehab admission: n/a     Possible need for SNF placement upon discharge: Not anticipated.      Patient Condition: I have reviewed medical records from [facility], spoken with CM, and patient and spouse. I met with patient at the bedside and discussed via phone for inpatient rehabilitation assessment.  Patient will benefit from ongoing PT, OT and SLP, can actively participate in 3 hours of therapy a day 5 days of the week, and can make measurable gains during the admission.  Patient will also benefit from the coordinated team approach during an Inpatient Acute Rehabilitation admission.  The patient will receive intensive therapy as well as Rehabilitation physician, nursing, social worker, and care management interventions.  Due to bladder management, bowel management, safety, skin/wound care, disease management, medication administration, pain management and patient education the patient requires 24 hour a day rehabilitation nursing.  The patient is currently min assist x40 with mobility and min to mod assist with basic ADLs.  Discharge setting and therapy post discharge at home with home health is anticipated.  Patient has agreed to participate in the Acute Inpatient Rehabilitation Program and will admit Saturday, May 22.   Preadmission  Screen Completed By:  Michel Santee, PT, DPT 11/02/2019 2:22 PM ______________________________________________________________________   Discussed status with Dr. Dagoberto Ligas on 11/02/19 at 2:31 PM  and received approval for admission today.   Admission Coordinator:  Michel Santee, PT, DPT time 2:31 PM Sudie Grumbling 11/02/19

## 2019-11-05 NOTE — Progress Notes (Signed)
Inpatient Rehabilitation Center Individual Statement of Services  Patient Name:  Jeffery Reilly  Date:  11/05/2019  Welcome to the Inpatient Rehabilitation Center.  Our goal is to provide you with an individualized program based on your diagnosis and situation, designed to meet your specific needs.  With this comprehensive rehabilitation program, you will be expected to participate in at least 3 hours of rehabilitation therapies Monday-Friday, with modified therapy programming on the weekends.  Your rehabilitation program will include the following services:  Physical Therapy (PT), Occupational Therapy (OT), Speech Therapy (ST), 24 hour per day rehabilitation nursing, Therapeutic Recreaction (TR), Psychology, Neuropsychology, Care Coordinator, Rehabilitation Medicine, Nutrition Services, Pharmacy Services and Other  Weekly team conferences will be held on Tuesdays to discuss your progress.  Your Inpatient Rehabilitation Care Coordinator will talk with you frequently to get your input and to update you on team discussions.  Team conferences with you and your family in attendance may also be held.  Expected length of stay: 2.5- 3 weeks   Overall anticipated outcome: Supervision  Depending on your progress and recovery, your program may change. Your Inpatient Rehabilitation Care Coordinator will coordinate services and will keep you informed of any changes. Your Inpatient Rehabilitation Care Coordinator's name and contact numbers are listed  below.  The following services may also be recommended but are not provided by the Inpatient Rehabilitation Center:   Driving Evaluations  Home Health Rehabiltiation Services  Outpatient Rehabilitation Services  Vocational Rehabilitation   Arrangements will be made to provide these services after discharge if needed.  Arrangements include referral to agencies that provide these services.  Your insurance has been verified to be:  Medicare A/B  Your  primary doctor is:  No PCP  Pertinent information will be shared with your doctor and your insurance company.  Inpatient Rehabilitation Care Coordinator:  Susie Cassette [phone] or ([phone]  Information discussed with and copy given to patient by: Gretchen Short, 11/05/2019, 10:18 AM

## 2019-11-05 NOTE — Anesthesia Postprocedure Evaluation (Signed)
Anesthesia Post Note  Patient: Monte Zinni  Procedure(s) Performed: IR WITH ANESTHESIA (N/A )     Patient location during evaluation: SICU Anesthesia Type: General Level of consciousness: sedated Pain management: pain level controlled Vital Signs Assessment: post-procedure vital signs reviewed and stable Respiratory status: patient remains intubated per anesthesia plan Cardiovascular status: stable Postop Assessment: no apparent nausea or vomiting Anesthetic complications: no    Last Vitals:  Vitals:   11/03/19 0912 11/03/19 1216  BP: 102/66 120/70  Pulse: 76 69  Resp: 16 20  Temp: 36.9 C 36.8 C  SpO2: 99% 100%    Last Pain:  Vitals:   11/03/19 1216  TempSrc: Oral  PainSc:                  Jameka Ivie

## 2019-11-05 NOTE — Progress Notes (Signed)
Initial Nutrition Assessment  DOCUMENTATION CODES:   Not applicable  INTERVENTION:   - Ensure Enlive po BID, each supplement provides 350 kcal and 20 grams of protein  - MVI with minerals  - Encourage PO intake  NUTRITION DIAGNOSIS:   Increased nutrient needs related to other (therapies) as evidenced by estimated needs.  GOAL:   Patient will meet greater than or equal to 90% of their needs  MONITOR:   PO intake, Supplement acceptance, Diet advancement, Labs, Weight trends, Skin  REASON FOR ASSESSMENT:   Malnutrition Screening Tool    ASSESSMENT:   74 year old male with PMH of CVA with residual right-sided weakness, tobacco abuse. Presented 10/30/19 with aphasia as well as AMS and left-sided weakness as well as dysarthria. Cranial CT scan showed advanced chronic small vessel disease no changes. CT angiogram of head and neck distal right M1 MCA occlusion. Pt underwent emergent thrombectomy of right MCA M1 occlusion with rescue stenting. A follow-up MRI showed scattered foci of restricted diffusion throughout the right MCA territory with a cluster at the temporal occipital region consistent with acute/subacute infarction. Admitted to CIR on 5/22.   Spoke with pt briefly at bedside. Pt requesting to use the bathroom and unwilling to provide history at this time. NT entering room at end of visit to assist pt in using the bathroom.  Pt states that he is eating "fine." RD noted 50% meal completions. Will order oral nutrition supplements to aid pt in meeting kcal and protein needs.  Reviewed weight history in chart. Pt with a 7.4 kg weight loss since 10/30/19. Question accuracy given short timeframe. If accurate, this is a 7.5% weight loss in less than 1 week which is severe and significant for timeframe. RD will assess for malnutrition and follow-up.  Per RN edema assessment, pt with non-pitting edema to BLE. This could be masking additional weight loss.  Meal Completion: 50% x 2  meals  Medications reviewed and include: SSI, senna  Labs reviewed. CBG's: 80-140 x 24 hours  UOP: 700 ml x 24 hours  NUTRITION - FOCUSED PHYSICAL EXAM:  Unable to complete at this time. Pt requesting to use the bathroom and NT entering room to assist.  Diet Order:   Diet Order            DIET DYS 2 Room service appropriate? Yes; Fluid consistency: Thin  Diet effective now              EDUCATION NEEDS:   No education needs have been identified at this time  Skin:  Skin Assessment: Reviewed RN Assessment (MASD to buttocks)  Last BM:  11/05/19 large type 6  Height:   Ht Readings from Last 1 Encounters:  11/03/19 5\' 10"  (1.778 m)    Weight:   Wt Readings from Last 1 Encounters:  11/05/19 90.7 kg    Ideal Body Weight:  75.5 kg  BMI:  Body mass index is 28.69 kg/m.  Estimated Nutritional Needs:   Kcal:  2000-2200  Protein:  100-115 grams  Fluid:  >/= 2.0 L    11/07/19, MS, RD, LDN Inpatient Clinical Dietitian Pager: [phone] Weekend/After Hours: [phone]

## 2019-11-05 NOTE — Progress Notes (Addendum)
[facility] PHYSICAL MEDICINE & REHABILITATION PROGRESS NOTE   Subjective/Complaints:  No problems overnight. Says he had bm. Ready for more therapy today!  ROS: Patient denies fever, rash, sore throat, blurred vision, nausea, vomiting, diarrhea, cough, shortness of breath or chest pain, joint or back pain, headache, or mood change.   Objective:   No results found. Recent Labs    11/03/19 0354  WBC 6.7  HGB 14.2  HCT 44.8  PLT 177   Recent Labs    11/03/19 0354  NA 139  K 4.1  CL 104  CO2 27  GLUCOSE 122*  BUN 10  CREATININE 0.97  CALCIUM 9.1    Intake/Output Summary (Last 24 hours) at 11/05/2019 1062 Last data filed at 11/05/2019 0700 Gross per 24 hour  Intake 476 ml  Output 700 ml  Net -224 ml     Physical Exam: Vital Signs Blood pressure 93/62, pulse 62, temperature 97.7 F (36.5 C), temperature source Oral, resp. rate 16, height 5\' 10"  (1.778 m), weight 90.7 kg, SpO2 97 %.  Physical Exam  Constitutional: No distress . Vital signs reviewed. HEENT: EOMI, oral membranes moist Neck: supple Cardiovascular: RRR without murmur. No JVD    Respiratory/Chest: CTA Bilaterally without wheezes or rales. Normal effort    GI/Abdomen: BS +, non-tender, non-distended Ext: no clubbing, cyanosis, or edema Psych: pleasant and cooperative Musc: no swelling or pain Neurological: He is alert and oriented x 3. Very dysarthric.  Sensation intact to light touch x 4 extremities Apraxia oral, verbal and limb apraxia ongoing RUE 4/5 with PD. RLE 3+ to 4/5. LUE and LLE 5/5. Decreased LT RUE and RLE.  Skin: Skin is warm and dry.  Venous stasis changes in skin of legs      Assessment/Plan: 1. Functional deficits secondary to R MCA stroke which require 3+ hours per day of interdisciplinary therapy in a comprehensive inpatient rehab setting.  Physiatrist is providing close team supervision and 24 hour management of active medical problems listed below.  Physiatrist and rehab  team continue to assess barriers to discharge/monitor patient progress toward functional and medical goals  Care Tool:  Bathing    Body parts bathed by patient: Right arm, Left arm, Chest, Abdomen, Front perineal area, Right upper leg, Left upper leg, Face   Body parts bathed by helper: Buttocks, Right lower leg, Left lower leg     Bathing assist Assist Level: Moderate Assistance - Patient 50 - 74%     Upper Body Dressing/Undressing Upper body dressing   What is the patient wearing?: Pull over shirt    Upper body assist Assist Level: Minimal Assistance - Patient > 75%    Lower Body Dressing/Undressing Lower body dressing      What is the patient wearing?: Incontinence brief, Pants     Lower body assist Assist for lower body dressing: Total Assistance - Patient < 25%     Toileting Toileting    Toileting assist Assist for toileting: Maximal Assistance - Patient 25 - 49%     Transfers Chair/bed transfer  Transfers assist           Locomotion Ambulation   Ambulation assist              Walk 10 feet activity   Assist           Walk 50 feet activity   Assist           Walk 150 feet activity   Assist  Walk 10 feet on uneven surface  activity   Assist           Wheelchair     Assist               Wheelchair 50 feet with 2 turns activity    Assist            Wheelchair 150 feet activity     Assist          Blood pressure 93/62, pulse 62, temperature 97.7 F (36.5 C), temperature source Oral, resp. rate 16, height 5\' 10"  (1.778 m), weight 90.7 kg, SpO2 97 %.  Medical Problem List and Plan: 1.  ?New left-sided weakness with dysarthria secondary to right MCA scattered and single punctate right cerebellar infarct with right M1 occlusion status post right M1 thrombectomy.   -Pt has history of L MCA stroke with chronic right-sided residual weakness             -patient may  shower              -ELOS/Goals: 2.5-3 weeks- Supervision 2.  Antithrombotics: -DVT/anticoagulation: continue with sq heparin.  -recent dopplers on 5/20 were clear             -antiplatelet therapy: Aspirin 81 mg daily and Plavix 75 mg daily 3. Pain Management: Tylenol as needed 4. Mood: Provide emotional support             -antipsychotic agents: N/A 5. Neuropsych: This patient is capable of making decisions on his own behalf. 6. Skin/Wound Care: Routine skin checks  -eucerin cream/vaseline for dry skin 7. Fluids/Electrolytes/Nutrition: Routine in and outs with follow-up chemistries 8.  Dysphagia.  Follow-up speech therapy.  Presently on dysphagia #2 thin liquid diet.  -tolerating diet well pt aware of precautions it appears 9.  Hypertension.  Lisinopril 40 mg daily decreased to 20mg  on 5/23  -may need to back off further as bp's still soft 10.  Seizure prophylaxis.  Keppra 500 mg twice daily.  EEG negative 11.  Hyperlipidemia.  Lipitor 12.  Morbid obesity?   BMI only 28.7 13.  New findings diabetes mellitus.  Hemoglobin A1c 7.4.  SSI. 14.  Tobacco abuse.  Counseling 15.  Medical noncompliance.  Counseling 16. Constipation  5/23- no BM since admission- started eating diet Friday- added senokot 2 tabs daily and has sorbitol prn.   5/24- bm overnight, large      LOS: 2 days A FACE TO FACE EVALUATION WAS PERFORMED  Meredith Staggers 11/05/2019, 9:03 AM

## 2019-11-05 NOTE — Progress Notes (Signed)
Inpatient Rehabilitation  Patient information reviewed and entered into eRehab system by Naveya Ellerman M. Margel Joens, M.A., CCC/SLP, PPS Coordinator.  Information including medical coding, functional ability and quality indicators will be reviewed and updated through discharge.    

## 2019-11-05 NOTE — Progress Notes (Signed)
Physical Medicine and Rehabilitation Consult Reason for Consult: Left side weakness and confusion with aphasia Referring Physician: Dr.Xu     HPI: Jeffery Reilly is a 74 y.o. right-handed male with history of CVA with residual right-sided weakness, morbid obesity with BMI 31.03, tobacco abuse on no prescription medications and reported medical noncompliance.  Per chart review lives with spouse independent prior to admission but rather sedentary.  1 level home 4 steps to entry.  Presented 10/30/2019 to [facility] with aphasia as well as altered mental status and left side weakness.  Cranial CT scan showed advanced chronic small vessel disease no acute changes.  CT angiogram of head and neck distal right M1 MCA occlusion.  No hemodynamically significant stenosis in the neck.  Patient underwent emergent thrombectomy of right MCA M1 occlusion with rescue stenting per Dr. Loreta Ave of interventional radiology.  A follow-up MRI showed scattered small foci of restricted diffusion throughout the right MCA territory with a cluster at the temporal occipital region consistent with acute/subacute infarction.  Echocardiogram pending.  EEG negative for seizure but was suggestive of mild diffuse encephalopathy admission chemistries unremarkable except glucose 131, urine drug screen negative, SARS coronavirus negative.  Presently maintained on aspirin and Plavix for CVA prophylaxis for 3 to 6 months due to stent placement.  Subcutaneous heparin for DVT prophylaxis.  Therapy evaluations completed with recommendations of physical medicine rehab consult     Pt reports he's hungry- hasn't had a BM in a few days because hasn't been fed in 2+ days, since stroke. Per nurse, SLP to see him this afternoon, and determine if can eat, have Coretrack.    Pt denies pain all all- has external catheter- doesn't know why.      Review of Systems  Constitutional: Negative for chills and fever.  HENT:  Negative for hearing loss.   Eyes: Negative for blurred vision and double vision.  Respiratory: Negative for cough and shortness of breath.   Cardiovascular: Positive for leg swelling. Negative for chest pain and palpitations.  Gastrointestinal: Positive for constipation. Negative for heartburn and nausea.  Genitourinary: Negative for dysuria and hematuria.  Musculoskeletal: Positive for myalgias.  Skin: Negative for rash.  Neurological: Positive for speech change and weakness.  All other systems reviewed and are negative.       Past Medical History:  Diagnosis Date  . Stroke [facility])           Past Surgical History:  Procedure Laterality Date  . ABDOMINAL SURGERY      . IR CT HEAD LTD   10/30/2019  . IR INTRA CRAN STENT   10/30/2019  . IR PERCUTANEOUS ART THROMBECTOMY/INFUSION INTRACRANIAL INC DIAG ANGIO   10/30/2019  . IR US GUIDE VASC ACCESS RIGHT   10/30/2019  . RADIOLOGY WITH ANESTHESIA N/A 10/30/2019    Procedure: IR WITH ANESTHESIA;  Surgeon: Gilmer Mor, DO;  Location: [facility] OR;  Service: Radiology;  Laterality: N/A;         Family History  Problem Relation Age of Onset  . Hypertension Mother    . Hypertension Father      Social History:  reports that he has quit smoking. He has never used smokeless tobacco. He reports that he does not drink alcohol or use drugs. Allergies: No Known Allergies No medications prior to admission.      Home: Home Living Family/patient expects to be discharged to:: Private residence Living Arrangements: Spouse/significant other Available Help at  Discharge: Family, Available 24 hours/day Type of Home: House Home Access: Stairs to enter [employer] of Steps: 4 Entrance Stairs-Rails: Can reach both Home Layout: One level Bathroom Shower/Tub: Engineer, manufacturing systems: Standard Home Equipment: Cane - single point, Banker History: Prior Function Level of Independence: Independent Comments: pt ambulates  household and limited community distances, raises hogs Functional Status:  Mobility: Bed Mobility Overal bed mobility: Needs Assistance Bed Mobility: Supine to Sit Supine to sit: Mod assist Transfers Overall transfer level: Needs assistance Equipment used: Rolling walker (2 wheeled) Transfers: Sit to/from Stand Sit to Stand: Mod assist, From elevated surface Stand pivot transfers: Max assist General transfer comment: pt with R knee extension, unable to advance RLE, PT providing significant weight shift and facilitation of pivot to aide in transfer Ambulation/Gait Ambulation/Gait assistance: Mod assist Gait Distance (Feet): 8 Feet(additional trial of 3' turning from bed to recliner) Assistive device: Rolling walker (2 wheeled) Gait Pattern/deviations: Step-to pattern, Shuffle, Trunk flexed General Gait Details: pt with short shuffling steps, significant trunk flexion which is exacerbated by fatigue despite PT cues for upright posture. Pt is able to minimally clear both feet with less clearance of RLE Gait velocity: reduced Gait velocity interpretation: <1.31 ft/sec, indicative of household ambulator   ADL:   Cognition: Cognition Overall Cognitive Status: Impaired/Different from baseline Orientation Level: (P) Oriented to person, Oriented to place, Oriented to situation, Disoriented to time Cognition Arousal/Alertness: Awake/alert Behavior During Therapy: WFL for tasks assessed/performed Overall Cognitive Status: Impaired/Different from baseline Area of Impairment: Memory, Safety/judgement, Awareness, Problem solving Orientation Level: Disoriented to, Place, Time(pt thinks he is still at [facility]) Memory: Decreased short-term memory, Decreased recall of precautions Safety/Judgement: Decreased awareness of safety, Decreased awareness of deficits Awareness: Intellectual Problem Solving: Slow processing, Requires verbal cues   Blood pressure 127/81, pulse 67, temperature 97.9 F (36.6  C), temperature source Oral, resp. rate 19, SpO2 95 %. Physical Exam  Nursing note and vitals reviewed. Constitutional: He appears well-developed and well-nourished.  Older male appears younger than age; sitting up at bedside chair, on monitor, appropriate but a little confused, NAD  HENT:  Head: Normocephalic and atraumatic.  Nose: Nose normal.  Mouth/Throat: Oropharynx is clear and moist. No oropharyngeal exudate.  Attempted to smile- has oral apraxia and unable to  Tongue deviated to R Coated with brownish coat of something  Eyes: Conjunctivae are normal.  EOMI B/L- nystagmus L>R  Neck: No tracheal deviation present.  Cardiovascular:  RRR_ HR 60-75 while there- no JVD  Respiratory:  CTA B/L- no W/R/R- good air movement  GI:  Soft, NT, slightly distended; hypoactive BS  Genitourinary:    Genitourinary Comments: External catheter attached to purewick container.    Musculoskeletal:     Cervical back: Normal range of motion and neck supple.     Comments: UEs- biceps 4-/5, triceps 4-/5, grip 4/5, finger abd 3+/5 B/L LEs- HF 2-/5, KE 3+/5, DF R 2-/5 L 3/5, PF 4-/5   Neurological: He is alert.  Patient is alert in no acute distress.  He does make eye contact with examiner.  He is dysarthric.  Follows simple commands.  Dysarthric- sounds like has mouth ful of marbles Verbal and oral apraxia noted Intact to light touch in all 4 extremities No hoffman's B/L No clonus B/L  Skin:  Intact- unable to assess backside since in chair/on monitor  Psychiatric:  Polite/appropriate- sleepy      Lab Results Last 24 Hours       Results for  orders placed or performed during the hospital encounter of 10/30/19 (from the past 24 hour(s))  Glucose, capillary     Status: Abnormal    Collection Time: 10/31/19 11:55 AM  Result Value Ref Range    Glucose-Capillary 159 (H) 70 - 99 mg/dL    Comment 1 Notify RN      Comment 2 Document in Chart    Glucose, capillary     Status: Abnormal     Collection Time: 10/31/19  4:01 PM  Result Value Ref Range    Glucose-Capillary 132 (H) 70 - 99 mg/dL    Comment 1 Notify RN      Comment 2 Document in Chart    Glucose, capillary     Status: Abnormal    Collection Time: 10/31/19  7:39 PM  Result Value Ref Range    Glucose-Capillary 146 (H) 70 - 99 mg/dL    Comment 1 Notify RN      Comment 2 Document in Chart    Glucose, capillary     Status: Abnormal    Collection Time: 10/31/19 11:37 PM  Result Value Ref Range    Glucose-Capillary 132 (H) 70 - 99 mg/dL    Comment 1 Notify RN      Comment 2 Document in Chart    Glucose, capillary     Status: Abnormal    Collection Time: 11/01/19  3:29 AM  Result Value Ref Range    Glucose-Capillary 127 (H) 70 - 99 mg/dL    Comment 1 Notify RN      Comment 2 Document in Chart    CBC     Status: Abnormal    Collection Time: 11/01/19  5:00 AM  Result Value Ref Range    WBC 7.5 4.0 - 10.5 K/uL    RBC 4.43 4.22 - 5.81 MIL/uL    Hemoglobin 12.3 (L) 13.0 - 17.0 g/dL    HCT 40.9 (L) 81.1 - 52.0 %    MCV 87.8 80.0 - 100.0 fL    MCH 27.8 26.0 - 34.0 pg    MCHC 31.6 30.0 - 36.0 g/dL    RDW 91.4 78.2 - 95.6 %    Platelets 158 150 - 400 K/uL    nRBC 0.0 0.0 - 0.2 %  Basic metabolic panel     Status: Abnormal    Collection Time: 11/01/19  5:00 AM  Result Value Ref Range    Sodium 142 135 - 145 mmol/L    Potassium 3.6 3.5 - 5.1 mmol/L    Chloride 111 98 - 111 mmol/L    CO2 24 22 - 32 mmol/L    Glucose, Bld 137 (H) 70 - 99 mg/dL    BUN 8 8 - 23 mg/dL    Creatinine, Ser 2.13 0.61 - 1.24 mg/dL    Calcium 8.4 (L) 8.9 - 10.3 mg/dL    GFR calc non Af Amer >60 >60 mL/min    GFR calc Af Amer >60 >60 mL/min    Anion gap 7 5 - 15  Triglycerides     Status: Abnormal    Collection Time: 11/01/19  5:00 AM  Result Value Ref Range    Triglycerides 240 (H) <150 mg/dL  Glucose, capillary     Status: Abnormal    Collection Time: 11/01/19  7:38 AM  Result Value Ref Range    Glucose-Capillary 115 (H) 70 - 99  mg/dL       Imaging Results (Last 48 hours)  CT Angio Head W or Wo  Contrast   Result Date: 10/30/2019 CLINICAL DATA:  Code stroke follow-up EXAM: CT ANGIOGRAPHY HEAD AND NECK TECHNIQUE: Multidetector CT imaging of the head and neck was performed using the standard protocol during bolus administration of intravenous contrast. Multiplanar CT image reconstructions and MIPs were obtained to evaluate the vascular anatomy. Carotid stenosis measurements (when applicable) are obtained utilizing NASCET criteria, using the distal internal carotid diameter as the denominator. CONTRAST:  75mL OMNIPAQUE IOHEXOL 350 MG/ML SOLN COMPARISON:  Earlier same day FINDINGS: CT HEAD FINDINGS Brain: There is no acute intracranial hemorrhage, mass effect, or edema. No new loss of gray-white differentiation. Chronic infarctions and chronic microvascular ischemic changes as described previously. Vascular: No new finding Skull: No new finding Sinuses: No new finding Orbits: No new finding Review of the MIP images confirms the above findings CTA NECK FINDINGS Aortic arch: Great vessel origins are patent. There is direct origin of the left vertebral artery from the arch. Right carotid system: Patent. No measurable stenosis at the ICA origin. Retropharyngeal course of the ICA. Left carotid system: Patent. No measurable stenosis at the ICA origin. Retropharyngeal course of the ICA. Vertebral arteries: Patent and codominant. Skeleton: Multilevel degenerative changes of the cervical spine. Other neck: No mass or adenopathy. Upper chest: No apical lung mass. Review of the MIP images confirms the above findings CTA HEAD FINDINGS Anterior circulation: Intracranial internal carotid arteries are patent. There is occlusion of the distal right M1 MCA. Diminished flow within the more distal right MCA territory. Anterior and left middle cerebral arteries are patent. Posterior circulation: Intracranial vertebral arteries are patent. Basilar artery is  patent with focal short segment moderate stenosis including the level of left AICA origin. Posterior cerebral arteries are patent. There is a patent right posterior communicating artery. Venous sinuses: Not well evaluated on this study. Review of the MIP images confirms the above findings IMPRESSION: Distal right M1 MCA occlusion. Partial reconstitution of more distal right MCA territory. No acute intracranial hemorrhage.  ASPECT score remains 10. No hemodynamically significant stenosis in the neck. These results were called by telephone at the time of interpretation on 10/30/2019 at 4:28 pm to provider [facility] , who verbally acknowledged these results. Electronically Signed   By: Guadlupe Spanish M.D.   On: 10/30/2019 16:32    CT Angio Neck W and/or Wo Contrast   Result Date: 10/30/2019 CLINICAL DATA:  Code stroke follow-up EXAM: CT ANGIOGRAPHY HEAD AND NECK TECHNIQUE: Multidetector CT imaging of the head and neck was performed using the standard protocol during bolus administration of intravenous contrast. Multiplanar CT image reconstructions and MIPs were obtained to evaluate the vascular anatomy. Carotid stenosis measurements (when applicable) are obtained utilizing NASCET criteria, using the distal internal carotid diameter as the denominator. CONTRAST:  75mL OMNIPAQUE IOHEXOL 350 MG/ML SOLN COMPARISON:  Earlier same day FINDINGS: CT HEAD FINDINGS Brain: There is no acute intracranial hemorrhage, mass effect, or edema. No new loss of gray-white differentiation. Chronic infarctions and chronic microvascular ischemic changes as described previously. Vascular: No new finding Skull: No new finding Sinuses: No new finding Orbits: No new finding Review of the MIP images confirms the above findings CTA NECK FINDINGS Aortic arch: Great vessel origins are patent. There is direct origin of the left vertebral artery from the arch. Right carotid system: Patent. No measurable stenosis at the ICA origin.  Retropharyngeal course of the ICA. Left carotid system: Patent. No measurable stenosis at the ICA origin. Retropharyngeal course of the ICA. Vertebral arteries: Patent and codominant. Skeleton:  Multilevel degenerative changes of the cervical spine. Other neck: No mass or adenopathy. Upper chest: No apical lung mass. Review of the MIP images confirms the above findings CTA HEAD FINDINGS Anterior circulation: Intracranial internal carotid arteries are patent. There is occlusion of the distal right M1 MCA. Diminished flow within the more distal right MCA territory. Anterior and left middle cerebral arteries are patent. Posterior circulation: Intracranial vertebral arteries are patent. Basilar artery is patent with focal short segment moderate stenosis including the level of left AICA origin. Posterior cerebral arteries are patent. There is a patent right posterior communicating artery. Venous sinuses: Not well evaluated on this study. Review of the MIP images confirms the above findings IMPRESSION: Distal right M1 MCA occlusion. Partial reconstitution of more distal right MCA territory. No acute intracranial hemorrhage.  ASPECT score remains 10. No hemodynamically significant stenosis in the neck. These results were called by telephone at the time of interpretation on 10/30/2019 at 4:28 pm to provider [facility] , who verbally acknowledged these results. Electronically Signed   By: Guadlupe Spanish M.D.   On: 10/30/2019 16:32    MR ANGIO HEAD WO CONTRAST   Result Date: 10/31/2019 CLINICAL DATA:  Stroke follow-up. EXAM: MRI HEAD WITHOUT CONTRAST MRA HEAD WITHOUT CONTRAST TECHNIQUE: Multiplanar, multiecho pulse sequences of the brain and surrounding structures were obtained without intravenous contrast. Angiographic images of the head were obtained using MRA technique without contrast. COMPARISON:  Head CT Oct 30, 2019 FINDINGS: The study is degraded by motion. MRI HEAD FINDINGS Brain: Scattered foci of restricted  diffusion is seen throughout the right MCA territory with a cluster at the temporal occipital region, consistent with acute/subacute infarcts. A focus of restricted diffusion is also seen right cerebellar hemisphere. Scattered and confluent foci of T2 hyperintensity are seen within the white matter of cerebral hemispheres, nonspecific, most likely related to chronic small vessel ischemia. Remote infarcts are seen in the bilateral corona radiata, bilateral basal ganglia and pons. Foci of susceptibility artifact are seen in the bilateral parietal regions, pons and right cerebellar hemisphere, most consistent with hemosiderin deposits. No acute hemorrhage, hydrocephalus, extra-axial collection or mass lesion. Vascular: Normal flow voids. Skull and upper cervical spine: Reversal of the cervical curvature. There is diffuse decreased T1 signal within the visualized upper cervical spine, may represent red marrow reconversion versus marrow replacement. Correlate clinically. Sinuses/Orbits: Mucosal thickening throughout the paranasal sinuses with fluid level within the left sphenoid sinus. The orbits are grossly unremarkable. Other: None. MRA HEAD FINDINGS Visualized upper cervical and intracranial internal carotid arteries have normal flow related enhancement without evidence of occlusion or stenosis. Status post stenting of the mid/distal M1/MCA segment. Susceptibility artifact precludes evaluation of state patency. However, normal flow related enhancement is seen in most of the M2 branches, with the exception of one middle division branch which has attenuated flow related enhancement (series 3, image 109). The bilateral ACA and left MCA vascular tree have normal flow related enhancement. The intracranial segment of the bilateral vertebral arteries, the basilar artery and bilateral posterior cerebral arteries have normal course and caliber and flow related enhancement without evidence of stenosis or occlusion. IMPRESSION:  1. Scattered small foci of restricted diffusion throughout the right MCA territory with a cluster at the temporal occipital region, consistent with acute/subacute infarcts. 2. A focus of restricted diffusion is also seen in the right cerebellar hemisphere. 3. Advanced chronic small vessel ischemia. Remote infarcts in the bilateral corona radiata, bilateral basal ganglia and pons. 4. Status post stenting of  the mid/distal M1/MCA segment. Normal flow related enhancement is seen in most of the M2 branches, with the exception of one middle division branch which has attenuated flow related enhancement. 5. Diffuse decreased T1 signal within the visualized upper cervical spine, may represent red marrow reconversion versus marrow replacement. Correlate clinically. Electronically Signed   By: Baldemar Lenis M.D.   On: 10/31/2019 15:36    MR BRAIN WO CONTRAST   Result Date: 10/31/2019 CLINICAL DATA:  Stroke follow-up. EXAM: MRI HEAD WITHOUT CONTRAST MRA HEAD WITHOUT CONTRAST TECHNIQUE: Multiplanar, multiecho pulse sequences of the brain and surrounding structures were obtained without intravenous contrast. Angiographic images of the head were obtained using MRA technique without contrast. COMPARISON:  Head CT Oct 30, 2019 FINDINGS: The study is degraded by motion. MRI HEAD FINDINGS Brain: Scattered foci of restricted diffusion is seen throughout the right MCA territory with a cluster at the temporal occipital region, consistent with acute/subacute infarcts. A focus of restricted diffusion is also seen right cerebellar hemisphere. Scattered and confluent foci of T2 hyperintensity are seen within the white matter of cerebral hemispheres, nonspecific, most likely related to chronic small vessel ischemia. Remote infarcts are seen in the bilateral corona radiata, bilateral basal ganglia and pons. Foci of susceptibility artifact are seen in the bilateral parietal regions, pons and right cerebellar hemisphere,  most consistent with hemosiderin deposits. No acute hemorrhage, hydrocephalus, extra-axial collection or mass lesion. Vascular: Normal flow voids. Skull and upper cervical spine: Reversal of the cervical curvature. There is diffuse decreased T1 signal within the visualized upper cervical spine, may represent red marrow reconversion versus marrow replacement. Correlate clinically. Sinuses/Orbits: Mucosal thickening throughout the paranasal sinuses with fluid level within the left sphenoid sinus. The orbits are grossly unremarkable. Other: None. MRA HEAD FINDINGS Visualized upper cervical and intracranial internal carotid arteries have normal flow related enhancement without evidence of occlusion or stenosis. Status post stenting of the mid/distal M1/MCA segment. Susceptibility artifact precludes evaluation of state patency. However, normal flow related enhancement is seen in most of the M2 branches, with the exception of one middle division branch which has attenuated flow related enhancement (series 3, image 109). The bilateral ACA and left MCA vascular tree have normal flow related enhancement. The intracranial segment of the bilateral vertebral arteries, the basilar artery and bilateral posterior cerebral arteries have normal course and caliber and flow related enhancement without evidence of stenosis or occlusion. IMPRESSION: 1. Scattered small foci of restricted diffusion throughout the right MCA territory with a cluster at the temporal occipital region, consistent with acute/subacute infarcts. 2. A focus of restricted diffusion is also seen in the right cerebellar hemisphere. 3. Advanced chronic small vessel ischemia. Remote infarcts in the bilateral corona radiata, bilateral basal ganglia and pons. 4. Status post stenting of the mid/distal M1/MCA segment. Normal flow related enhancement is seen in most of the M2 branches, with the exception of one middle division branch which has attenuated flow related  enhancement. 5. Diffuse decreased T1 signal within the visualized upper cervical spine, may represent red marrow reconversion versus marrow replacement. Correlate clinically. Electronically Signed   By: Baldemar Lenis M.D.   On: 10/31/2019 15:36    IR CT Head Ltd   Result Date: 10/30/2019 INDICATION: 74 year old male with acute stroke, right MCA M1 occlusion EXAM: ULTRASOUND GUIDED ACCESS RIGHT COMMON FEMORAL ARTERY CERVICAL AND CEREBRAL ANGIOGRAM MECHANICAL THROMBECTOMY RIGHT MCA/M1 RESCUE STENT OF RIGHT MCA INTRACRANIAL ATHEROSCLEROSIS ANGIO-SEAL FOR CLOSURE COMPARISON:  CT imaging same day MEDICATIONS: 3  mg intra arterial integral and, 650 mg aspirin OG, 300 mg Plavix OG ANESTHESIA/SEDATION: The anesthesia team was present to provide general endotracheal tube anesthesia and for patient monitoring during the procedure. Intubation was performed in negative pressure [place] in neuro IR holding. Interventional neuro radiology nursing staff was also present. CONTRAST:  170 cc Omnipaque 300 FLUOROSCOPY TIME:  Fluoroscopy Time: 40 minutes 42 seconds (3,316 mGy). COMPLICATIONS: None TECHNIQUE: Informed written consent was obtained from the patient's family after a thorough discussion of the procedural risks, benefits and alternatives. Specific risks discussed include: Bleeding, infection, contrast reaction, kidney injury/failure, need for further procedure/surgery, arterial injury or dissection, embolization to new territory, intracranial hemorrhage (10-15% risk), neurologic deterioration, cardiopulmonary collapse, death. All questions were addressed. Maximal Sterile Barrier Technique was utilized including during the procedure including caps, mask, sterile gowns, sterile gloves, sterile drape, hand hygiene and skin antiseptic. A timeout was performed prior to the initiation of the procedure. The anesthesia team was present to provide general endotracheal tube anesthesia and for patient monitoring during  the procedure. Interventional neuro radiology nursing staff was also present. FINDINGS: Initial Findings: Right internal carotid artery: Tortuosity of the cervical segment. Vertical and petrous segment patent with normal course caliber contour. Cavernous segment patent. Clinoid segment patent. Antegrade flow of the ophthalmic artery. Ophthalmic segment patent. Terminus patent. Right MCA: Proximal M1 segment is patent. There is a tapered cuff-off of the mid segment of the M1, in the region the mid orbit. Proximal temporal branch remains patent. TICI 0 flow through the M1 segment. There late filling with collaterals via the right anterior cerebral artery and the right posterior cerebral artery via leptomeningeal collateral vessels. Patent anterior communicating artery with the temporal region supplied by the PCA. Right ACA: A 1 segment patent. A 2 segment perfuses the right territory. Patent anterior communicating artery with significant cross-filling of the left anterior cerebral artery. Completion Findings: During the mechanical thrombectomy, the initial 2 passes resulted in TICI 2B flow, with partial filling of greater than 50% of the right-sided hemispheric MCA branches. The cortical branches after the first 2 passes are filling both from leptomeningeal collaterals as well as through the attenuated M1 segment. The final pass with the EMBO trapped device uncovered atherosclerotic plaque at the distal M1 segment. This atherosclerotic plaque was then treated with drug-eluting balloon mounted stent. Right MCA: TICI 3 flow was restored through the right hemisphere. The stent was successful in restoring flow through the disease segment of the M1 Right common carotid artery:  Normal course caliber and contour. Right external carotid artery: Patent with antegrade flow. Flat panel CT demonstrates minimal staining within the supra ganglionic right MCA territory with no subarachnoid hemorrhage or large parenchymal  hemorrhage. PROCEDURE: Patient was brought to the [facility] suite, and identity was confirmed. Patient was prepped and draped in the usual sterile fashion. Ultrasound survey of the right inguinal region was performed with images stored and sent to PACs. 11 blade scalpel was used to make a small incision. Blunt dissection was performed with US guidance. A micropuncture needle was used access the right common femoral artery under ultrasound. With excellent arterial blood flow returned, an .018 micro wire was passed through the needle, observed to enter the abdominal aorta under fluoroscopy. The needle was removed, and a micropuncture sheath was placed over the wire. The inner dilator and wire were removed, and an 035 wire was advanced under fluoroscopy into the abdominal aorta. The sheath was removed and a 25cm 51F straight vascular sheath was  placed. The dilator was removed and the sheath was flushed. Limited angiogram was performed. Sheath was attached to pressurized and heparinized saline bag for constant forward flow. A coaxial system was then advanced over the 035 wire. This included a 110 cm Zoom 088 catheter with coaxial 125cm Berenstein diagnostic catheter. This was advanced to the proximal descending thoracic aorta. Wire was then removed. Double flush of the catheter was performed. Catheter was then used to select the innominate artery and the common carotid artery. The catheter combination was then navigated into the cervical ICA segment. The Berenstein catheter and the Glidewire were removed. Angiogram was performed. Road map function was used once the occluded vessel was identified. Copious back flush was performed and the 088 catheter was attached to heparinized and pressurized saline bag for forward flow. A second coaxial system was then advanced through the balloon catheter, which included the selected intermediate catheter, microcatheter, and microwire. In this scenario, the set up included a 137 cm 071 Zoom  catheter, a Trevo Provue18 microcatheter, and 014 synchro soft wire. This system was advanced through the guide catheter under the road-map function, with adequate back-flush at the rotating hemostatic valve at that back end of the balloon guide. Microcatheter and the intermediate catheter system were advanced through the terminal ICA and MCA to the level of the occlusion. The micro wire in the microcatheter were then removed. These 071 Zoom catheter was then advanced to the face of the occlusion for the first pass attempt, adapt technique. Catheter was withdrawn while at the same time the 088 distal access catheter was advanced through the carotid siphon to the terminus. Angiogram confirmed persistent occlusion. The 071 catheter was then readvanced through the distal access catheter with a coaxial microwire and microcatheter. Microcatheter and the microwire were then gently navigated through the occlusion into angular branch of the MCA. Microcatheter was advanced through the occlusion. Wire was removed. Blood was then aspirated through the hub of the microcatheter, and a gentle contrast injection was performed confirming intraluminal position. A rotating hemostatic valve was then attached to the back end of the microcatheter, and a pressurized and heparinized saline bag was attached to the catheter. 4 x 40 solitaire device was then selected. Back flush was achieved at the rotating hemostatic valve, and then the device was gently advanced through the microcatheter to the distal end. The retriever was then unsheathed by withdrawing the microcatheter under fluoroscopy. Once the retriever was completely unsheathed, the microcatheter was carefully stripped from the delivery device. Control angiogram was performed from the intermediate catheter. A 3 minute time interval was observed. Solitaire stent was then withdrawn to the 071 catheter under fluoroscopy. Once the retriever was "corked" within the tip of the  intermediate catheter, both were removed from the system. Free aspiration was confirmed at the hub of the 088 distal access catheter, with free blood return confirmed. Control angiogram was performed. Partial occlusion remained with TICI 2b flow at this point. We then advanced the zoom catheter with the microwire and microcatheter combination for a third pass attempt, using the adapt technique. 071 catheter was gently advanced to the face of the clot, and aspiration was performed. Once the catheter was withdrawn into the base 088 distal guide catheter, aspiration was performed at the hub of the 088 catheter. Angiogram was performed. Partial occlusion remained with TICI 2b flow at this point. We then proceeded with a fourth pass. The 071 catheter was then readvanced through the distal access catheter with a coaxial microwire  and microcatheter. Microcatheter and the microwire were then gently navigated to the face of the occlusion. The synchro soft wire would not advance through the occlusion. A headliner J wire 012 was then used through the microcatheter, successful in passing through the occlusion into the angular branch. Microcatheter was advanced through the occlusion. Wire was removed. Blood was then aspirated through the hub of the microcatheter, and a gentle contrast injection was performed confirming intraluminal position. A rotating hemostatic valve was then attached to the back end of the microcatheter, and a pressurized and heparinized saline bag was attached to the catheter. 5 mm by 37 mm EMBO trapped was then selected. Back flush was achieved at the rotating hemostatic valve, and then the device was gently advanced through the microcatheter to the distal end. The retriever was then unsheathed by withdrawing the microcatheter under fluoroscopy. Once the retriever was completely unsheathed, the microcatheter was carefully stripped from the delivery device. Control angiogram was performed from the  intermediate catheter. This control angiogram confirmed flow through the EMBO trap, with a lesion at the site of the occlusion, representing native intracranial atherosclerosis. While the stent tree vert remained open for proximally 5 minutes, we had the anesthesia team pass and OG tube. We administered 650 mg of aspirin. Estimation of the diameter length of the lesion were performed. We selected a drug-eluting balloon mounted stent, 2.25 mm x 15 mm. After approximately 5 minutes, the stent tree vert was removed. Angiogram was performed confirming TICI TB flow, relatively unchanged. We then navigated the microcatheter and the microwire/synchro wire back through the 088 distal access catheter. This combination was successful in crossing the stenosis/lesion in the distal M1. The catheter micro wire were placed into the angular branch and the wire was removed. Gentle injection confirmed location. A transcend wire was then placed through the microcatheter and the microcatheter was removed. A baseline angiogram was performed. We then deployed a balloon mounted drug-eluting stent. We selected a Onyx 2.54mm x 15mm stent. This was deployed with 7 atmosphere inflation. Balloon was deflated and withdrawn. Repeat angiogram was performed. TICI 3 was achieved. Wires and catheters were removed. The distal access catheter was withdrawn to the cervical ICA and repeat angiogram was performed of the cervical segment. The 088 catheter was then withdrawn. The skin at the puncture site was then cleaned with Chlorhexidine. The 8 French sheath was removed and an 24F angioseal was deployed. Flat panel CT was performed. Patient tolerated the procedure well and remained hemodynamically stable throughout. No complications were encountered and no significant blood loss encountered. IMPRESSION: Status post ultrasound guided access right common femoral artery for cervical and cerebral angiogram, mechanical thrombectomy of right M1 occlusion, and  rescue stenting of native intracranial atherosclerotic permissive lesion with 2.25 mm x 15 mm drug-eluting stent, restoring TICI 3 flow. Angio-Seal for hemostasis. Signed, Yvone Neu. Reyne Dumas, RPVI [facility] Radiology PLAN: The patient will remain intubated. ICU status Target systolic blood pressure of 120-140 Right hip straight time 6 hours Frequent neurovascular checks Repeat neurologic imaging with CT and/MRI at the discretion of neurology team Dual anti-platelet therapy. Electronically Signed   By: Gilmer Mor D.O.   On: 10/30/2019 20:58    IR US Guide Vasc Access Right   Result Date: 10/30/2019 INDICATION: 75 year old male with acute stroke, right MCA M1 occlusion EXAM: ULTRASOUND GUIDED ACCESS RIGHT COMMON FEMORAL ARTERY CERVICAL AND CEREBRAL ANGIOGRAM MECHANICAL THROMBECTOMY RIGHT MCA/M1 RESCUE STENT OF RIGHT MCA INTRACRANIAL ATHEROSCLEROSIS ANGIO-SEAL FOR CLOSURE  COMPARISON:  CT imaging same day MEDICATIONS: 3 mg intra arterial integral and, 650 mg aspirin OG, 300 mg Plavix OG ANESTHESIA/SEDATION: The anesthesia team was present to provide general endotracheal tube anesthesia and for patient monitoring during the procedure. Intubation was performed in negative pressure [place] in neuro IR holding. Interventional neuro radiology nursing staff was also present. CONTRAST:  170 cc Omnipaque 300 FLUOROSCOPY TIME:  Fluoroscopy Time: 40 minutes 42 seconds (3,316 mGy). COMPLICATIONS: None TECHNIQUE: Informed written consent was obtained from the patient's family after a thorough discussion of the procedural risks, benefits and alternatives. Specific risks discussed include: Bleeding, infection, contrast reaction, kidney injury/failure, need for further procedure/surgery, arterial injury or dissection, embolization to new territory, intracranial hemorrhage (10-15% risk), neurologic deterioration, cardiopulmonary collapse, death. All questions were addressed.  Maximal Sterile Barrier Technique was utilized including during the procedure including caps, mask, sterile gowns, sterile gloves, sterile drape, hand hygiene and skin antiseptic. A timeout was performed prior to the initiation of the procedure. The anesthesia team was present to provide general endotracheal tube anesthesia and for patient monitoring during the procedure. Interventional neuro radiology nursing staff was also present. FINDINGS: Initial Findings: Right internal carotid artery: Tortuosity of the cervical segment. Vertical and petrous segment patent with normal course caliber contour. Cavernous segment patent. Clinoid segment patent. Antegrade flow of the ophthalmic artery. Ophthalmic segment patent. Terminus patent. Right MCA: Proximal M1 segment is patent. There is a tapered cuff-off of the mid segment of the M1, in the region the mid orbit. Proximal temporal branch remains patent. TICI 0 flow through the M1 segment. There late filling with collaterals via the right anterior cerebral artery and the right posterior cerebral artery via leptomeningeal collateral vessels. Patent anterior communicating artery with the temporal region supplied by the PCA. Right ACA: A 1 segment patent. A 2 segment perfuses the right territory. Patent anterior communicating artery with significant cross-filling of the left anterior cerebral artery. Completion Findings: During the mechanical thrombectomy, the initial 2 passes resulted in TICI 2B flow, with partial filling of greater than 50% of the right-sided hemispheric MCA branches. The cortical branches after the first 2 passes are filling both from leptomeningeal collaterals as well as through the attenuated M1 segment. The final pass with the EMBO trapped device uncovered atherosclerotic plaque at the distal M1 segment. This atherosclerotic plaque was then treated with drug-eluting balloon mounted stent. Right MCA: TICI 3 flow was restored through the right hemisphere.  The stent was successful in restoring flow through the disease segment of the M1 Right common carotid artery:  Normal course caliber and contour. Right external carotid artery: Patent with antegrade flow. Flat panel CT demonstrates minimal staining within the supra ganglionic right MCA territory with no subarachnoid hemorrhage or large parenchymal hemorrhage. PROCEDURE: Patient was brought to the [facility] suite, and identity was confirmed. Patient was prepped and draped in the usual sterile fashion. Ultrasound survey of the right inguinal region was performed with images stored and sent to PACs. 11 blade scalpel was used to make a small incision. Blunt dissection was performed with US guidance. A micropuncture needle was used access the right common femoral artery under ultrasound. With excellent arterial blood flow returned, an .018 micro wire was passed through the needle, observed to enter the abdominal aorta under fluoroscopy. The needle was removed, and a micropuncture sheath was placed over the wire. The inner dilator and wire were removed, and an 035 wire was advanced under fluoroscopy into the abdominal aorta. The sheath was removed  and a 25cm 34F straight vascular sheath was placed. The dilator was removed and the sheath was flushed. Limited angiogram was performed. Sheath was attached to pressurized and heparinized saline bag for constant forward flow. A coaxial system was then advanced over the 035 wire. This included a 110 cm Zoom 088 catheter with coaxial 125cm Berenstein diagnostic catheter. This was advanced to the proximal descending thoracic aorta. Wire was then removed. Double flush of the catheter was performed. Catheter was then used to select the innominate artery and the common carotid artery. The catheter combination was then navigated into the cervical ICA segment. The Berenstein catheter and the Glidewire were removed. Angiogram was performed. Road map function was used once the occluded vessel  was identified. Copious back flush was performed and the 088 catheter was attached to heparinized and pressurized saline bag for forward flow. A second coaxial system was then advanced through the balloon catheter, which included the selected intermediate catheter, microcatheter, and microwire. In this scenario, the set up included a 137 cm 071 Zoom catheter, a Trevo Provue18 microcatheter, and 014 synchro soft wire. This system was advanced through the guide catheter under the road-map function, with adequate back-flush at the rotating hemostatic valve at that back end of the balloon guide. Microcatheter and the intermediate catheter system were advanced through the terminal ICA and MCA to the level of the occlusion. The micro wire in the microcatheter were then removed. These 071 Zoom catheter was then advanced to the face of the occlusion for the first pass attempt, adapt technique. Catheter was withdrawn while at the same time the 088 distal access catheter was advanced through the carotid siphon to the terminus. Angiogram confirmed persistent occlusion. The 071 catheter was then readvanced through the distal access catheter with a coaxial microwire and microcatheter. Microcatheter and the microwire were then gently navigated through the occlusion into angular branch of the MCA. Microcatheter was advanced through the occlusion. Wire was removed. Blood was then aspirated through the hub of the microcatheter, and a gentle contrast injection was performed confirming intraluminal position. A rotating hemostatic valve was then attached to the back end of the microcatheter, and a pressurized and heparinized saline bag was attached to the catheter. 4 x 40 solitaire device was then selected. Back flush was achieved at the rotating hemostatic valve, and then the device was gently advanced through the microcatheter to the distal end. The retriever was then unsheathed by withdrawing the microcatheter under fluoroscopy.  Once the retriever was completely unsheathed, the microcatheter was carefully stripped from the delivery device. Control angiogram was performed from the intermediate catheter. A 3 minute time interval was observed. Solitaire stent was then withdrawn to the 071 catheter under fluoroscopy. Once the retriever was "corked" within the tip of the intermediate catheter, both were removed from the system. Free aspiration was confirmed at the hub of the 088 distal access catheter, with free blood return confirmed. Control angiogram was performed. Partial occlusion remained with TICI 2b flow at this point. We then advanced the zoom catheter with the microwire and microcatheter combination for a third pass attempt, using the adapt technique. 071 catheter was gently advanced to the face of the clot, and aspiration was performed. Once the catheter was withdrawn into the base 088 distal guide catheter, aspiration was performed at the hub of the 088 catheter. Angiogram was performed. Partial occlusion remained with TICI 2b flow at this point. We then proceeded with a fourth pass. The 071 catheter was then readvanced through  the distal access catheter with a coaxial microwire and microcatheter. Microcatheter and the microwire were then gently navigated to the face of the occlusion. The synchro soft wire would not advance through the occlusion. A headliner J wire 012 was then used through the microcatheter, successful in passing through the occlusion into the angular branch. Microcatheter was advanced through the occlusion. Wire was removed. Blood was then aspirated through the hub of the microcatheter, and a gentle contrast injection was performed confirming intraluminal position. A rotating hemostatic valve was then attached to the back end of the microcatheter, and a pressurized and heparinized saline bag was attached to the catheter. 5 mm by 37 mm EMBO trapped was then selected. Back flush was achieved at the rotating  hemostatic valve, and then the device was gently advanced through the microcatheter to the distal end. The retriever was then unsheathed by withdrawing the microcatheter under fluoroscopy. Once the retriever was completely unsheathed, the microcatheter was carefully stripped from the delivery device. Control angiogram was performed from the intermediate catheter. This control angiogram confirmed flow through the EMBO trap, with a lesion at the site of the occlusion, representing native intracranial atherosclerosis. While the stent tree vert remained open for proximally 5 minutes, we had the anesthesia team pass and OG tube. We administered 650 mg of aspirin. Estimation of the diameter length of the lesion were performed. We selected a drug-eluting balloon mounted stent, 2.25 mm x 15 mm. After approximately 5 minutes, the stent tree vert was removed. Angiogram was performed confirming TICI TB flow, relatively unchanged. We then navigated the microcatheter and the microwire/synchro wire back through the 088 distal access catheter. This combination was successful in crossing the stenosis/lesion in the distal M1. The catheter micro wire were placed into the angular branch and the wire was removed. Gentle injection confirmed location. A transcend wire was then placed through the microcatheter and the microcatheter was removed. A baseline angiogram was performed. We then deployed a balloon mounted drug-eluting stent. We selected a Onyx 2.46mm x 15mm stent. This was deployed with 7 atmosphere inflation. Balloon was deflated and withdrawn. Repeat angiogram was performed. TICI 3 was achieved. Wires and catheters were removed. The distal access catheter was withdrawn to the cervical ICA and repeat angiogram was performed of the cervical segment. The 088 catheter was then withdrawn. The skin at the puncture site was then cleaned with Chlorhexidine. The 8 French sheath was removed and an 73F angioseal was deployed. Flat panel CT  was performed. Patient tolerated the procedure well and remained hemodynamically stable throughout. No complications were encountered and no significant blood loss encountered. IMPRESSION: Status post ultrasound guided access right common femoral artery for cervical and cerebral angiogram, mechanical thrombectomy of right M1 occlusion, and rescue stenting of native intracranial atherosclerotic permissive lesion with 2.25 mm x 15 mm drug-eluting stent, restoring TICI 3 flow. Angio-Seal for hemostasis. Signed, Yvone Neu. Reyne Dumas, RPVI [facility] Radiology PLAN: The patient will remain intubated. ICU status Target systolic blood pressure of 120-140 Right hip straight time 6 hours Frequent neurovascular checks Repeat neurologic imaging with CT and/MRI at the discretion of neurology team Dual anti-platelet therapy. Electronically Signed   By: Gilmer Mor D.O.   On: 10/30/2019 20:58    DG CHEST PORT 1 VIEW   Result Date: 10/31/2019 CLINICAL DATA:  CVA. EXAM: PORTABLE CHEST 1 VIEW COMPARISON:  None. FINDINGS: The endotracheal tube is 5.5 cm above the carina. The NG tube is coursing down the  esophagus and into the stomach. The cardiac silhouette, mediastinal and hilar contours are within normal limits given the AP projection and portable technique. No acute pulmonary findings. No pleural effusions or pneumothorax. The bony thorax is intact. IMPRESSION: No acute cardiopulmonary findings. Electronically Signed   By: Rudie MeyerP.  Gallerani M.D.   On: 10/31/2019 06:35    EEG adult   Result Date: 10/31/2019 Charlsie QuestYadav, Priyanka O, MD     10/31/2019  5:30 PM Patient Name: Heide GuileWillie Helbing MRN: [id number] Epilepsy Attending: Charlsie QuestPriyanka O Yadav Referring Physician/Provider: Dr Marvel PlanJindong Xu Date: 10/31/2019 Duration: 23.40 mins Patient history: 74 y.o. male with history of  L MCA stroke with resultant R sided weakness and HTN, presented with transient recurrent episodes of aphasia and automatisms  of R arm with increased tone in all extremities. EEG to evaluate for seizure. Level of alertness: Awake AEDs during EEG study: Propofol Technical aspects: This EEG study was done with scalp electrodes positioned according to the 10-20 International system of electrode placement. Electrical activity was acquired at a sampling rate of 500Hz  and reviewed with a high frequency filter of 70Hz  and a low frequency filter of 1Hz . EEG data were recorded continuously and digitally stored. Description:  No clear posterior dominant rhythm was seen. EEG showed continuous generalized 5-8 Hz theta-alpha activity.  Hyperventilation and photic stimulation were not performed.   ABNORMALITY -Continuous slow, generalized IMPRESSION: This study is suggestive of mild diffuse encephalopathy, nonspecific etiology. No seizures or epileptiform discharges were seen throughout the recording. Priyanka O Yadav    IR PERCUTANEOUS ART THROMBECTOMY/INFUSION INTRACRANIAL INC DIAG ANGIO   Result Date: 10/30/2019 INDICATION: 74 year old male with acute stroke, right MCA M1 occlusion EXAM: ULTRASOUND GUIDED ACCESS RIGHT COMMON FEMORAL ARTERY CERVICAL AND CEREBRAL ANGIOGRAM MECHANICAL THROMBECTOMY RIGHT MCA/M1 RESCUE STENT OF RIGHT MCA INTRACRANIAL ATHEROSCLEROSIS ANGIO-SEAL FOR CLOSURE COMPARISON:  CT imaging same day MEDICATIONS: 3 mg intra arterial integral and, 650 mg aspirin OG, 300 mg Plavix OG ANESTHESIA/SEDATION: The anesthesia team was present to provide general endotracheal tube anesthesia and for patient monitoring during the procedure. Intubation was performed in negative pressure [place] in neuro IR holding. Interventional neuro radiology nursing staff was also present. CONTRAST:  170 cc Omnipaque 300 FLUOROSCOPY TIME:  Fluoroscopy Time: 40 minutes 42 seconds (3,316 mGy). COMPLICATIONS: None TECHNIQUE: Informed written consent was obtained from the patient's family after a thorough discussion of the procedural risks, benefits and  alternatives. Specific risks discussed include: Bleeding, infection, contrast reaction, kidney injury/failure, need for further procedure/surgery, arterial injury or dissection, embolization to new territory, intracranial hemorrhage (10-15% risk), neurologic deterioration, cardiopulmonary collapse, death. All questions were addressed. Maximal Sterile Barrier Technique was utilized including during the procedure including caps, mask, sterile gowns, sterile gloves, sterile drape, hand hygiene and skin antiseptic. A timeout was performed prior to the initiation of the procedure. The anesthesia team was present to provide general endotracheal tube anesthesia and for patient monitoring during the procedure. Interventional neuro radiology nursing staff was also present. FINDINGS: Initial Findings: Right internal carotid artery: Tortuosity of the cervical segment. Vertical and petrous segment patent with normal course caliber contour. Cavernous segment patent. Clinoid segment patent. Antegrade flow of the ophthalmic artery. Ophthalmic segment patent. Terminus patent. Right MCA: Proximal M1 segment is patent. There is a tapered cuff-off of the mid segment of the M1, in the region the mid orbit. Proximal temporal branch remains patent. TICI 0 flow through the M1 segment. There late filling with collaterals via the right anterior cerebral artery and the right posterior cerebral  artery via leptomeningeal collateral vessels. Patent anterior communicating artery with the temporal region supplied by the PCA. Right ACA: A 1 segment patent. A 2 segment perfuses the right territory. Patent anterior communicating artery with significant cross-filling of the left anterior cerebral artery. Completion Findings: During the mechanical thrombectomy, the initial 2 passes resulted in TICI 2B flow, with partial filling of greater than 50% of the right-sided hemispheric MCA branches. The cortical branches after the first 2 passes are filling  both from leptomeningeal collaterals as well as through the attenuated M1 segment. The final pass with the EMBO trapped device uncovered atherosclerotic plaque at the distal M1 segment. This atherosclerotic plaque was then treated with drug-eluting balloon mounted stent. Right MCA: TICI 3 flow was restored through the right hemisphere. The stent was successful in restoring flow through the disease segment of the M1 Right common carotid artery:  Normal course caliber and contour. Right external carotid artery: Patent with antegrade flow. Flat panel CT demonstrates minimal staining within the supra ganglionic right MCA territory with no subarachnoid hemorrhage or large parenchymal hemorrhage. PROCEDURE: Patient was brought to the [facility] suite, and identity was confirmed. Patient was prepped and draped in the usual sterile fashion. Ultrasound survey of the right inguinal region was performed with images stored and sent to PACs. 11 blade scalpel was used to make a small incision. Blunt dissection was performed with US guidance. A micropuncture needle was used access the right common femoral artery under ultrasound. With excellent arterial blood flow returned, an .018 micro wire was passed through the needle, observed to enter the abdominal aorta under fluoroscopy. The needle was removed, and a micropuncture sheath was placed over the wire. The inner dilator and wire were removed, and an 035 wire was advanced under fluoroscopy into the abdominal aorta. The sheath was removed and a 25cm 69F straight vascular sheath was placed. The dilator was removed and the sheath was flushed. Limited angiogram was performed. Sheath was attached to pressurized and heparinized saline bag for constant forward flow. A coaxial system was then advanced over the 035 wire. This included a 110 cm Zoom 088 catheter with coaxial 125cm Berenstein diagnostic catheter. This was advanced to the proximal descending thoracic aorta. Wire was then removed.  Double flush of the catheter was performed. Catheter was then used to select the innominate artery and the common carotid artery. The catheter combination was then navigated into the cervical ICA segment. The Berenstein catheter and the Glidewire were removed. Angiogram was performed. Road map function was used once the occluded vessel was identified. Copious back flush was performed and the 088 catheter was attached to heparinized and pressurized saline bag for forward flow. A second coaxial system was then advanced through the balloon catheter, which included the selected intermediate catheter, microcatheter, and microwire. In this scenario, the set up included a 137 cm 071 Zoom catheter, a Trevo Provue18 microcatheter, and 014 synchro soft wire. This system was advanced through the guide catheter under the road-map function, with adequate back-flush at the rotating hemostatic valve at that back end of the balloon guide. Microcatheter and the intermediate catheter system were advanced through the terminal ICA and MCA to the level of the occlusion. The micro wire in the microcatheter were then removed. These 071 Zoom catheter was then advanced to the face of the occlusion for the first pass attempt, adapt technique. Catheter was withdrawn while at the same time the 088 distal access catheter was advanced through the carotid siphon to the terminus.  Angiogram confirmed persistent occlusion. The 071 catheter was then readvanced through the distal access catheter with a coaxial microwire and microcatheter. Microcatheter and the microwire were then gently navigated through the occlusion into angular branch of the MCA. Microcatheter was advanced through the occlusion. Wire was removed. Blood was then aspirated through the hub of the microcatheter, and a gentle contrast injection was performed confirming intraluminal position. A rotating hemostatic valve was then attached to the back end of the microcatheter, and a  pressurized and heparinized saline bag was attached to the catheter. 4 x 40 solitaire device was then selected. Back flush was achieved at the rotating hemostatic valve, and then the device was gently advanced through the microcatheter to the distal end. The retriever was then unsheathed by withdrawing the microcatheter under fluoroscopy. Once the retriever was completely unsheathed, the microcatheter was carefully stripped from the delivery device. Control angiogram was performed from the intermediate catheter. A 3 minute time interval was observed. Solitaire stent was then withdrawn to the 071 catheter under fluoroscopy. Once the retriever was "corked" within the tip of the intermediate catheter, both were removed from the system. Free aspiration was confirmed at the hub of the 088 distal access catheter, with free blood return confirmed. Control angiogram was performed. Partial occlusion remained with TICI 2b flow at this point. We then advanced the zoom catheter with the microwire and microcatheter combination for a third pass attempt, using the adapt technique. 071 catheter was gently advanced to the face of the clot, and aspiration was performed. Once the catheter was withdrawn into the base 088 distal guide catheter, aspiration was performed at the hub of the 088 catheter. Angiogram was performed. Partial occlusion remained with TICI 2b flow at this point. We then proceeded with a fourth pass. The 071 catheter was then readvanced through the distal access catheter with a coaxial microwire and microcatheter. Microcatheter and the microwire were then gently navigated to the face of the occlusion. The synchro soft wire would not advance through the occlusion. A headliner J wire 012 was then used through the microcatheter, successful in passing through the occlusion into the angular branch. Microcatheter was advanced through the occlusion. Wire was removed. Blood was then aspirated through the hub of the  microcatheter, and a gentle contrast injection was performed confirming intraluminal position. A rotating hemostatic valve was then attached to the back end of the microcatheter, and a pressurized and heparinized saline bag was attached to the catheter. 5 mm by 37 mm EMBO trapped was then selected. Back flush was achieved at the rotating hemostatic valve, and then the device was gently advanced through the microcatheter to the distal end. The retriever was then unsheathed by withdrawing the microcatheter under fluoroscopy. Once the retriever was completely unsheathed, the microcatheter was carefully stripped from the delivery device. Control angiogram was performed from the intermediate catheter. This control angiogram confirmed flow through the EMBO trap, with a lesion at the site of the occlusion, representing native intracranial atherosclerosis. While the stent tree vert remained open for proximally 5 minutes, we had the anesthesia team pass and OG tube. We administered 650 mg of aspirin. Estimation of the diameter length of the lesion were performed. We selected a drug-eluting balloon mounted stent, 2.25 mm x 15 mm. After approximately 5 minutes, the stent tree vert was removed. Angiogram was performed confirming TICI TB flow, relatively unchanged. We then navigated the microcatheter and the microwire/synchro wire back through the 088 distal access catheter. This combination was successful in  crossing the stenosis/lesion in the distal M1. The catheter micro wire were placed into the angular branch and the wire was removed. Gentle injection confirmed location. A transcend wire was then placed through the microcatheter and the microcatheter was removed. A baseline angiogram was performed. We then deployed a balloon mounted drug-eluting stent. We selected a Onyx 2.43mm x 15mm stent. This was deployed with 7 atmosphere inflation. Balloon was deflated and withdrawn. Repeat angiogram was performed. TICI 3 was  achieved. Wires and catheters were removed. The distal access catheter was withdrawn to the cervical ICA and repeat angiogram was performed of the cervical segment. The 088 catheter was then withdrawn. The skin at the puncture site was then cleaned with Chlorhexidine. The 8 French sheath was removed and an 75F angioseal was deployed. Flat panel CT was performed. Patient tolerated the procedure well and remained hemodynamically stable throughout. No complications were encountered and no significant blood loss encountered. IMPRESSION: Status post ultrasound guided access right common femoral artery for cervical and cerebral angiogram, mechanical thrombectomy of right M1 occlusion, and rescue stenting of native intracranial atherosclerotic permissive lesion with 2.25 mm x 15 mm drug-eluting stent, restoring TICI 3 flow. Angio-Seal for hemostasis. Signed, Yvone Neu. Reyne Dumas, RPVI [facility] Radiology PLAN: The patient will remain intubated. ICU status Target systolic blood pressure of 120-140 Right hip straight time 6 hours Frequent neurovascular checks Repeat neurologic imaging with CT and/MRI at the discretion of neurology team Dual anti-platelet therapy. Electronically Signed   By: Gilmer Mor D.O.   On: 10/30/2019 20:58    CT HEAD CODE STROKE WO CONTRAST`   Addendum Date: 10/30/2019   ADDENDUM REPORT: 10/30/2019 15:38 ADDENDUM: Study discussed by telephone with Dr. Chesley Noon on 10/30/2019 at 1532 hours. Electronically Signed   By: Odessa Fleming M.D.   On: 10/30/2019 15:38    Result Date: 10/30/2019 CLINICAL DATA:  Code stroke. 75 year old male last known well 1100 hours. Right gaze deviation and aphasia. EXAM: CT HEAD WITHOUT CONTRAST TECHNIQUE: Contiguous axial images were obtained from the base of the skull through the vertex without intravenous contrast. COMPARISON:  Head CT 03/21/2018. FINDINGS: Brain: Stable cerebral volume. No midline shift, mass effect,  or evidence of intracranial mass lesion. No ventriculomegaly. No acute intracranial hemorrhage identified. Advanced chronic small vessel ischemia in the bilateral corona radiata and deep gray nuclei, with Patchy and confluent bilateral hypodensity more so on the left. Some associated ex vacuo enlargement of the left lateral ventricle. But gray-white matter differentiation appears stable since 2019. No cortically based acute infarct identified. No cortical encephalomalacia identified. Vascular: Mild Calcified atherosclerosis at the skull base. No suspicious intracranial vascular hyperdensity. Skull: Stable, negative. Sinuses/Orbits: Chronic bubbly opacity in the left sphenoid. Other Visualized paranasal sinuses and mastoids are stable and well pneumatized. Other: Rightward gaze deviation. No acute scalp soft tissue finding. ASPECTS [facility] Stroke Program Early CT Score) Total score (0-10 with 10 being normal): 10 (chronic encephalomalacia). IMPRESSION: 1. Advanced chronic small vessel disease appears stable by CT since 2019. 2. No acute cortically based infarct or acute intracranial hemorrhage identified. ASPECTS 10. Electronically Signed: By: Odessa Fleming M.D. 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  1. Does the need for close, 24 hr/day medical supervision in concert with the patient's rehab needs make it unreasonable for this patient to be served in a less  intensive setting? Yes 2. Co-Morbidities requiring supervision/potential complications: HTN, previous stroke, dysarthria, apraxia, dysphagia 3. Due to bladder management, bowel management, safety, skin/wound care, disease management, medication administration and patient education, does the patient require 24 hr/day rehab nursing? Yes 4. Does the patient require coordinated care of a physician, rehab nurse, therapy disciplines of PT<  OT, and SLP to address physical and functional deficits in the context of the above medical diagnosis(es)? Yes Addressing deficits in the following areas: balance, endurance, locomotion, strength, transferring, bathing, dressing, feeding, grooming, toileting, cognition, speech and swallowing 5. Can the patient actively participate in an intensive therapy program of at least 3 hrs of therapy per day at least 5 days per week? Yes 6. The potential for patient to make measurable gains while on inpatient rehab is good 7. Anticipated functional outcomes upon discharge from inpatient rehab are supervision and min assist  with PT, supervision and min assist with OT, supervision and min assist with SLP. 8. Estimated rehab length of stay to reach the above functional goals is: 2-2/5 weeks 9. Anticipated discharge destination: Home 10. Overall Rehab/Functional Prognosis: good   RECOMMENDATIONS: This patient's condition is appropriate for continued rehabilitative care in the following setting: CIR Patient has agreed to participate in recommended program. Potentially Note that insurance prior authorization may be required for reimbursement for recommended care.   Comment:  1. Pt reports he's very hungry- if unable to feed him, suggest D5 NS so he can get some nutrition.  2. Pt has apraxia- verbal and oral apraxia- will make it harder to eat/speak- needs SLP aggressively. Also has mild neglect- set up room to compensate. 3. Suggest helping pt have BM if possible.  4. Pt is an appropriate candidate for inpt Rehab- CIR- will submit for approval and have admissions coordinators speak with pt.  5. Thank you for this consult.  6. Will follow remotely until ready for CIR.     Mcarthur Rossetti Angiulli, PA-C 11/01/2019      I have personally performed a face to face diagnostic evaluation of this patient and formulated the key components of the plan.  Additionally, I have personally reviewed laboratory data, imaging  studies, as well as relevant notes and concur with the physician assistant's documentation above.

## 2019-11-05 NOTE — Plan of Care (Signed)
  Problem: RH SKIN INTEGRITY Goal: RH STG SKIN FREE OF INFECTION/BREAKDOWN Outcome: Progressing Goal: RH STG MAINTAIN SKIN INTEGRITY WITH ASSISTANCE Description: STG Maintain Skin Integrity With moderate Assistance. Outcome: Progressing   Problem: RH SAFETY Goal: RH STG ADHERE TO SAFETY PRECAUTIONS W/ASSISTANCE/DEVICE Description: STG Adhere to Safety Precautions With moderate Assistance/Device. Outcome: Progressing

## 2019-11-05 NOTE — Progress Notes (Signed)
Physical Therapy Session Note  Patient Details  Name: Jeffery Reilly MRN: [id number] Date of Birth: 22-Jul-1945  Today's Date: 11/05/2019 PT Individual Time: 1305-1400 PT Individual Time Calculation (min): 55 min   Short Term Goals: Week 1:  PT Short Term Goal 1 (Week 1): Patient will perform basic transfers with CGA. PT Short Term Goal 2 (Week 1): Patient will ambulate >150 ft with CGA. PT Short Term Goal 3 (Week 1): Patient will perform dynamic balance activities >3 min with CGA. PT Short Term Goal 4 (Week 1): Patient will perform 4-6" steps with min A.  Skilled Therapeutic Interventions/Progress Updates:   Pt received in w/c and agreeable to therapy, denies pain. Sit>stand from w/c w/ mod assist, verbal cues for anterior trunk lean and weight shifting forwards. Ambulated to therapy gym w/o AD, min assist overall. Tactile and verbal cues for upright posture. Worked on anterior weight shifting w/ sit<>stand transitions, performed sit<>stand from edge of mat 2x5 reps w/ min assist fading to CGA. Max verbal, tactile, and visual cues for anterior trunk lean and to bring pelvis anteriorly once stabilizing in stance, pt w/ posterior lean bias but flexed trunk posture. Worked on anterior weight shifting in seated w/ reaching to floor to pick up playing cards and matching in front of him. Verbal encouragement to weight shift forward and trust that his body will not let him fall forward. Pt expressed frustration w/ ELOS of 2-3 weeks. Discussed that his goals are supervision and for his wife to not provide any physical assistance. He does agree that he is not at that functional level at this point and agrees it would be beneficial to stay on CIR longer. Ambulated back to room, slightly improved gait speed but remains non-functional. Min assist overall w/ verbal and tactile cues for upright posture. Ended session in recliner, all needs in reach.   Therapy Documentation Precautions:  Precautions Precautions:  Fall Precaution Comments: Mild Rt hemi Restrictions Weight Bearing Restrictions: No  Therapy/Group: Individual Therapy  Nabiha Planck Melton Krebs 11/05/2019, 2:01 PM

## 2019-11-05 NOTE — Progress Notes (Signed)
Occupational Therapy Session Note  Patient Details  Name: Jeffery Reilly MRN: [id number] Date of Birth: Nov 03, 1945  Today's Date: 11/05/2019 OT Individual Time: 6979-4801 OT Individual Time Calculation (min): 65 min   Session 2:  OT Individual Time: 1450-1520 OT Individual Time Calculation (min): 30 min   Short Term Goals: Week 1:  OT Short Term Goal 1 (Week 1): Pt will complete toilet transfers with Min A sit<stand OT Short Term Goal 2 (Week 1): Pt will complete shower transfer with Min A using LRAD OT Short Term Goal 3 (Week 1): Pt will complete 2 grooming tasks while standing at the sink to improve standing balance and standing endurance  Skilled Therapeutic Interventions/Progress Updates:    Pt received supine with no c/o pain, agreeable to shower. Pt alert and oriented, dysarthric with copious oral secretions. Pt required mod cueing and min A to come EOB with very slow and effortful motor planning. Pt stood with RW with mod A and completed functional mobility into the bathroom with min-mod A overall. Cueing required for RUE placement on Rw and RW management overall. Pt transferred onto shower bench and IV site was occluded. Pt washed UB and LB, with extensive time spent on BLE 2/2 very dry skin flaking off. Difficult to discern perseveration vs meticulous cleaning. Pt washed buttocks with mod A. Pt required min cueing for thoroughness of washing and moving on from LB. Pt transferred out of shower with min A with BUE use of grab bars. In w/c at sink pt completed oral care with set up assist. Pt stood from w/c with mod lifting A. Pt donned shirt with min A. Pants donned with mod A. Pt was left sitting up in the w/c with chair alarm belt fastened and all needs met.   Session 2:  [Pt received in recliner with no c/o pain. Pt agreeable to OT session. Pt required mod A to power up from recliner with mod cueing for motor planning scooting forward. Cueing required for UE placement on RW. Pt completed  10 ft of functional mobility with the rw with min A overall. Pt was transported to the therapy gym where he transferred to the mat. Pt sat unsupported and completed RUE functional reaching with clothespins to improve R hand strength and dexterity. Min proximal support required to facilitate shoulder flexion to 90 degrees. Pt was returned to his room and left sitting up in the recliner with chair alarm belt fastened.    Therapy Documentation Precautions:  Precautions Precautions: Fall Precaution Comments: Mild Rt hemi Restrictions Weight Bearing Restrictions: No  Therapy/Group: Individual Therapy  Curtis Sites 11/05/2019, 10:36 AM

## 2019-11-05 NOTE — Progress Notes (Signed)
Speech Language Pathology Daily Session Note  Patient Details  Name: Jeffery Reilly MRN: [id number] Date of Birth: Feb 28, 1946  Today's Date: 11/05/2019 SLP Individual Time: 0715-0810 SLP Individual Time Calculation (min): 55 min  Short Term Goals: Week 1: SLP Short Term Goal 1 (Week 1): Pt will consume current diet, demonstrating efficient mastication and oral clearance with Mod A cues for ues of compensatory swallow strategies. SLP Short Term Goal 2 (Week 1): Pt will consume trials of upgraded dysphagia 3 solids with efficient mastication and oral clearance X3 prior to advancement. SLP Short Term Goal 3 (Week 1): Pt will use strategies to increase intelligibility to 60% at the phrase level with Max A cues. SLP Short Term Goal 4 (Week 1): Pt will demonstrate ability to recall new and/or daily information with Max A for use of compensatory strategies and/or aids. SLP Short Term Goal 5 (Week 1): Pt will demonstrate ability to problem solve functional basic situaiton with Mod A verbal/visual cues. SLP Short Term Goal 6 (Week 1): Pt will identify acute impairments (1 physical and 1 cognitive impairment) impacting his daily functioning with Max A cues.  Skilled Therapeutic Interventions: Skilled treatment session focused on dysphagia and cognitive goals. SLP facilitated session by providing overall Min A verbal cues for use of small bites, a slow rate of self-feeding and liquid washes to help clear residue from his oral cavity with Dys. 2 textures. Patient demonstrated mildly prolonged mastication without overt s/s of aspiration. Recommend patient continue current diet with full supervision for utilization of strategies. SLP also facilitated session by providing extra time and Mod-Max A verbal and visual cues for functional problem solving during a basic money management task. Patient left upright in bed with alarm on and all needs within reach. Continue with current plan of care.      Pain No/Denies  Pain   Therapy/Group: Individual Therapy  Jeffery Reilly 11/05/2019, 8:11 AM

## 2019-11-05 NOTE — Progress Notes (Signed)
Inpatient Rehabilitation Care Coordinator Assessment and Plan  Patient Details  Name: Jeffery Reilly MRN: [id number] Date of Birth: 03-Apr-1946  Today's Date: 11/05/2019  Problem List:  Patient Active Problem List   Diagnosis Date Noted  . Right middle cerebral artery stroke ([place]) 11/03/2019  . Stroke [facility]) 10/30/2019   Past Medical History:  Past Medical History:  Diagnosis Date  . Stroke [facility])    Past Surgical History:  Past Surgical History:  Procedure Laterality Date  . ABDOMINAL SURGERY    . IR CT HEAD LTD  10/30/2019  . IR INTRA CRAN STENT  10/30/2019  . IR PERCUTANEOUS ART THROMBECTOMY/INFUSION INTRACRANIAL INC DIAG ANGIO  10/30/2019  . IR US GUIDE VASC ACCESS RIGHT  10/30/2019  . RADIOLOGY WITH ANESTHESIA N/A 10/30/2019   Procedure: IR WITH ANESTHESIA;  Surgeon: Corrie Mckusick, DO;  Location: [place];  Service: Radiology;  Laterality: N/A;   Social History:  reports that he has quit smoking. He has never used smokeless tobacco. He reports that he does not drink alcohol or use drugs.  Family / Support Systems Marital Status: Married Patient Roles: Spouse, Parent Spouse/Significant OtherGarry Heater [phone] cell [phone] Children: 2 adult children Other Supports: N/A Anticipated Caregiver: wife Ability/Limitations of Caregiver: None reported Caregiver Availability: 24/7 Family Dynamics: Pt lives with his wife.  Social History Preferred language: English Religion:  Cultural Background: Pt is retired Passenger transport manager Read: Yes Write: Yes Employment Status: Retired Date Retired/Disabled/Unemployed: 2004 Public relations account executive Issues: Denies Guardian/Conservator: N/A   Abuse/Neglect Abuse/Neglect Assessment Can Be Completed: Unable to assess, patient is non-responsive or altered mental status(SW unable to assess due to aphasia) Physical Abuse: Denies Verbal Abuse: Denies Sexual Abuse: Denies Exploitation of patient/patient's resources:  Denies Self-Neglect: Denies  Emotional Status Pt's affect, behavior and adjustment status: Pt appeared to be in good spirits Recent Psychosocial Issues: Wife reports no incidents Psychiatric History: Wife denies Substance Abuse History: Wife denies; admits pt quit smoking at age 39 after he suffered a stroke  Patient / Family Perceptions, Expectations & Goals Pt/Family understanding of illness & functional limitations: Wife has a general understanding of husband's care needs Premorbid pt/family roles/activities: Independent Anticipated changes in roles/activities/participation: Assistance with ADLs/IADLs  Community Resources [employer]: None Premorbid Home Care/DME Agencies: None Transportation available at discharge: wife to transport  Discharge Planning Living Arrangements: Spouse/significant other [employer]: Spouse/significant other Type of Residence: Private residence Insurance Resources: Chartered certified accountant Resources: [place] Referred: No Living Expenses: Own Money Management: Spouse Does the patient have any problems obtaining your medications?: No Care Coordinator Barriers to Discharge: Decreased caregiver support, Lack of/limited family support Care Coordinator Barriers to Discharge Comments: wife is primary caregiver Care Coordinator Anticipated Follow Up Needs: HH/OP Expected length of stay: 2.5-3 weeks  Clinical Impression SW met with pt in efforts to complete assessment. Due to aphasia, SW unable to communicate with pt. Pt was able to agree with nonverbal communicating of nodding, to call his wife.   SW called pt wife Lynn Ito [phone]) to introduce self, explain role, and discuss discharge process. She confirms that she will be his primary caregiver, and there will not be any additional help as their daughter works. Pt has no HCPOA. Pt is not a English as a second language teacher.Pt has no DME. Wife aware SW to follow-up after team conference with updates.   Timo Hartwig  A Saim Almanza 11/05/2019, 5:12 PM

## 2019-11-06 ENCOUNTER — Inpatient Hospital Stay (HOSPITAL_COMMUNITY): Payer: Medicare Other | Admitting: Physical Therapy

## 2019-11-06 ENCOUNTER — Inpatient Hospital Stay (HOSPITAL_COMMUNITY): Payer: Medicare Other

## 2019-11-06 ENCOUNTER — Inpatient Hospital Stay (HOSPITAL_COMMUNITY): Payer: Medicare Other | Admitting: Occupational Therapy

## 2019-11-06 ENCOUNTER — Inpatient Hospital Stay (HOSPITAL_COMMUNITY): Payer: Medicare Other | Admitting: Speech Pathology

## 2019-11-06 LAB — GLUCOSE, CAPILLARY
Glucose-Capillary: 105 mg/dL — ABNORMAL HIGH (ref 70–99)
Glucose-Capillary: 143 mg/dL — ABNORMAL HIGH (ref 70–99)

## 2019-11-06 NOTE — Progress Notes (Signed)
Speech Language Pathology Daily Session Note  Patient Details  Name: Jeffery Reilly MRN: [id number] Date of Birth: December 18, 1945  Today's Date: 11/06/2019 SLP Individual Time: 0820-0900 SLP Individual Time Calculation (min): 40 min  Short Term Goals: Week 1: SLP Short Term Goal 1 (Week 1): Pt will consume current diet, demonstrating efficient mastication and oral clearance with Mod A cues for ues of compensatory swallow strategies. SLP Short Term Goal 2 (Week 1): Pt will consume trials of upgraded dysphagia 3 solids with efficient mastication and oral clearance X3 prior to advancement. SLP Short Term Goal 3 (Week 1): Pt will use strategies to increase intelligibility to 60% at the phrase level with Max A cues. SLP Short Term Goal 4 (Week 1): Pt will demonstrate ability to recall new and/or daily information with Max A for use of compensatory strategies and/or aids. SLP Short Term Goal 5 (Week 1): Pt will demonstrate ability to problem solve functional basic situaiton with Mod A verbal/visual cues. SLP Short Term Goal 6 (Week 1): Pt will identify acute impairments (1 physical and 1 cognitive impairment) impacting his daily functioning with Max A cues.  Skilled Therapeutic Interventions: Skilled treatment session focused on dysphagia and cognitive goals. Prior to arrival, the patient's NT reported coughing with breakfast. When SLP questioned the patient, he reported it was from too much food in his oral cavity. SLP provided education on importance of small bites and a slow rate of self-feeding. NT and patient verbalized understanding. Patient consumed thin liquids via straw at bedside without overt s/s of aspiration. Recommend patient continue current diet of Dys. 2 textures with thin liquids with full supervision.  SLP also facilitated session by providing total A for recall of his current medications and their functions. SLP provided education regarding need for taking medications appropriately at  discharge, patient verbalized understanding and agreement. Patient left upright in bed with alarm on and all needs within reach. Continue with current plan of care.      Pain Pain Assessment Pain Scale: 0-10 Pain Score: 0-No pain  Therapy/Group: Individual Therapy  Mikey Maffett 11/06/2019, 10:49 AM

## 2019-11-06 NOTE — Progress Notes (Signed)
Occupational Therapy Session Note  Patient Details  Name: Eathen Budreau MRN: [id number] Date of Birth: 1945/12/10  Today's Date: 11/06/2019 OT Individual Time: 1445-1530 OT Individual Time Calculation (min): 45 min    Short Term Goals: Week 1:  OT Short Term Goal 1 (Week 1): Pt will complete toilet transfers with Min A sit<stand OT Short Term Goal 2 (Week 1): Pt will complete shower transfer with Min A using LRAD OT Short Term Goal 3 (Week 1): Pt will complete 2 grooming tasks while standing at the sink to improve standing balance and standing endurance  Skilled Therapeutic Interventions/Progress Updates:  Patient met seated in recliner in agreement OT treatment session with focus on household mobility, functional transfers, and BUE strengthening as detailed below. Sit to stand x multiple trials throughout treatment session with Min A and cueing for hand placement and pace. Functional mobility to and from ortho gym with CGA, increased time, and cues for step length and proximity to RW. Patient engaged in 11mn of there ex with use of BUE arm ergometer on level 3 with RMP between 18-20. Session concluded with patient seated in recliner with call bell within reach, belt alarm activated, and all needs met.   Therapy Documentation Precautions:  Precautions Precautions: Fall Precaution Comments: Mild Rt hemi Restrictions Weight Bearing Restrictions: No General:    Therapy/Group: Individual Therapy  Ellwyn Ergle R [place]-Davis 11/06/2019, 2:53 PM

## 2019-11-06 NOTE — Patient Care Conference (Signed)
Inpatient RehabilitationTeam Conference and Plan of Care Update Date: 11/06/2019   Time: 2:53 PM    Patient Name: Jeffery Reilly      Medical Record Number: [id number]  Date of Birth: 1946-06-08 Sex: Male         Room/Bed: 4M05C/4M05C-01 Payor Info: Payor: MEDICARE / Plan: MEDICARE PART A AND B / Product Type: *No Product type* /    Admit Date/Time:  11/03/2019  4:37 PM  Primary Diagnosis:  Right middle cerebral artery stroke [facility])  Patient Active Problem List   Diagnosis Date Noted  . Right middle cerebral artery stroke ([place]) 11/03/2019  . Stroke [facility]) 10/30/2019    Expected Discharge Date: Expected Discharge Date: 11/16/19  Team Members Present: Physician leading conference: Dr. Alger Simons Care Coodinator Present: Loralee Pacas, LCSWA;Christina Sampson Goon, BSW;Other (comment)(Shakiera Edelson Creig Hines, RN, BSN, CRRN) Nurse Present: Dorthula Nettles, RN PT Present: Burnard Bunting, PT OT Present: Laverle Hobby, OT SLP Present: Weston Anna, SLP PPS Coordinator present : Gunnar Fusi, Novella Olive, PT     Current Status/Progress Goal Weekly Team Focus  Bowel/Bladder   Continent of bowel and bladder  Remain continent of bowel and bladder  Assess q shift, Bladder scan and straight cath if no void in 8 hours.   Swallow/Nutrition/ Hydration   Dys. 2 textures with thin liquids, Min A for use of a slow rate and small bites. Prolonged mastication due to lack of dentition  Supervision  use of compenstatory strategies, trials of Dys. 3 textures   ADL's   Slow motor planning, min A UB ADLs, mod A LB ADLs, mod A transfers, RUE mild hemi- full AROM with coordination and strength deficits as well as slight inattention  Supervision overall  RUE NMR, ADL transfers, ADL retraining, d/c planning   Mobility   min-mod assist overall w/o AD, gait >150' at a time, slow gait speed  supervision overall  gait and transfers, functional balance and postural control, endurance and global strengthening    Communication   Mod-Max A for 50-75% intelligibilty. Had baseline dysarthria from previous stroke, patient reports he does not feel his speech has changed since this stroke.  Min A  use of intelligibility strategies.   Safety/Cognition/ Behavioral Observations  Mod A-suspect maybe close to baseline, reports he enjoyed the simple things (bought a biscuit, fed his hogs then sat on porch and waved to cars the rest of the day)  Min A  recall with use of strageies, basic problem solving, attention and awareness   Pain   No complaints of pain  Remain pain free  Assess q shift and administer prn pain meds when needed   Skin   Dry, flaky. Skin intact.  Maintain skin integrity. Apply lotion to reduce dryness and flakiness.  Assess q shift and document any changes to skin integrity      *See Care Plan and progress notes for long and short-term goals.     Barriers to Discharge  Current Status/Progress Possible Resolutions Date Resolved   Nursing  Other (comments);Decreased caregiver support;Lack of/limited family support  Anxious, crying. Wants wife and two daughters to come and visit him while in the hospital.            PT  Inaccessible home environment;Medication compliance  4 steps to enter home, history of non-compliance w/ medications, decreased awareness of deficits  working on global strengthening and upright balance to progress stairs, ongoing education of deficits and emergent awareness of their impact on function  OT Home environment access/layout    Working on [employer], awareness of deficits, overall independence and safety with ADLs           SLP     focus on awareness of deficits, safety awareness          Care Coordinator Decreased caregiver support;Lack of/limited family support wife is primary caregiver            Discharge Planning/Teaching Needs:   D/C to home with the support of his wife.   Family education as recommended by therapy.  Team Discussion: Would like  for wife and 2 daughters to come visit him while he is in the hospital.   Revisions to Treatment Plan: MD discontinued blood sugar checks.     Medical Summary Current Status: New right mca infarct, right cerebellar infarct, old left MCA infarct. Weekly Focus/Goal: maximize swallowing safety, nutrition, bp control  Barriers to Discharge: Medical stability   Possible Resolutions to Barriers: regular labs, bp mgt   Continued Need for Acute Rehabilitation Level of Care: The patient requires daily medical management by a physician with specialized training in physical medicine and rehabilitation for the following reasons: Direction of a multidisciplinary physical rehabilitation program to maximize functional independence : Yes Medical management of patient stability for increased activity during participation in an intensive rehabilitation regime.: Yes Analysis of laboratory values and/or radiology reports with any subsequent need for medication adjustment and/or medical intervention. : Yes   I attest that I was present, lead the team conference, and concur with the assessment and plan of the team.   Tennis Must 11/06/2019, 2:53 PM

## 2019-11-06 NOTE — Progress Notes (Signed)
Jeffery Reilly PHYSICAL MEDICINE & REHABILITATION PROGRESS NOTE   Subjective/Complaints:  Had a good night. Discussed his level of function before new stroke. Said his swallowing wasn't a whole lot different. Claims to have been walking without an AD  ROS: Patient denies fever, rash, sore throat, blurred vision, nausea, vomiting, diarrhea, cough, shortness of breath or chest pain, joint or back pain, headache, or mood change.   Objective:   No results found. Recent Labs    11/05/19 0948  WBC 5.7  HGB 14.4  HCT 46.2  PLT 189   Recent Labs    11/05/19 0948  NA 137  K 4.3  CL 104  CO2 22  GLUCOSE 130*  BUN 19  CREATININE 1.02  CALCIUM 8.8*    Intake/Output Summary (Last 24 hours) at 11/06/2019 0924 Last data filed at 11/06/2019 1751 Gross per 24 hour  Intake 140 ml  Output 400 ml  Net -260 ml     Physical Exam: Vital Signs Blood pressure 110/73, pulse (!) 109, temperature 97.7 F (36.5 C), resp. rate 16, height 5\' 10"  (1.778 m), weight 90.7 kg, SpO2 100 %.  Physical Exam  Constitutional: No distress . Vital signs reviewed. HEENT: EOMI, oral membranes moist Neck: supple Cardiovascular: RRR without murmur. No JVD    Respiratory/Chest: CTA Bilaterally without wheezes or rales. Normal effort    GI/Abdomen: BS +, non-tender, non-distended Ext: no clubbing, cyanosis, or edema Psych: pleasant and cooperative Musc: no swelling or pain Neurological: He is alert and oriented x 3. Very dysarthric.  Sensation intact to light touch x 4 extremities. Very dysarthric Apraxia oral, verbal and limb apraxia ongoing RUE 4/5 with PD. RLE 3+ to 4/5. LUE and LLE 5/5. Decreased LT RUE and RLE.  Skin: Skin is warm and dry.  Venous stasis changes in skin of legs stable      Assessment/Plan: 1. Functional deficits secondary to R MCA stroke which require 3+ hours per day of interdisciplinary therapy in a comprehensive inpatient rehab setting.  Physiatrist is providing close team  supervision and 24 hour management of active medical problems listed below.  Physiatrist and rehab team continue to assess barriers to discharge/monitor patient progress toward functional and medical goals  Care Tool:  Bathing    Body parts bathed by patient: Right arm, Left arm, Chest, Abdomen, Front perineal area, Right upper leg, Left upper leg, Face, Right lower leg, Left lower leg   Body parts bathed by helper: Buttocks     Bathing assist Assist Level: Moderate Assistance - Patient 50 - 74%     Upper Body Dressing/Undressing Upper body dressing   What is the patient wearing?: Pull over shirt    Upper body assist Assist Level: Minimal Assistance - Patient > 75%    Lower Body Dressing/Undressing Lower body dressing      What is the patient wearing?: Incontinence brief, Pants     Lower body assist Assist for lower body dressing: Moderate Assistance - Patient 50 - 74%     Toileting Toileting    Toileting assist Assist for toileting: Maximal Assistance - Patient 25 - 49%     Transfers Chair/bed transfer  Transfers assist     Chair/bed transfer assist level: Minimal Assistance - Patient > 75%     Locomotion Ambulation   Ambulation assist      Assist level: Minimal Assistance - Patient > 75% Assistive device: No Device Max distance: 150'   Walk 10 feet activity   Assist  Assist level: Minimal Assistance - Patient > 75% Assistive device: No Device   Walk 50 feet activity   Assist    Assist level: Minimal Assistance - Patient > 75% Assistive device: No Device    Walk 150 feet activity   Assist    Assist level: Minimal Assistance - Patient > 75% Assistive device: No Device    Walk 10 feet on uneven surface  activity   Assist Walk 10 feet on uneven surfaces activity did not occur: Safety/medical concerns(decreased balance, motor planning, and R foot clearance)         Wheelchair     Assist   Type of Wheelchair:  Manual    Wheelchair assist level: Supervision/Verbal cueing, Moderate Assistance - Patient 50 - 74% Max wheelchair distance: 150'    Wheelchair 50 feet with 2 turns activity    Assist        Assist Level: Supervision/Verbal cueing   Wheelchair 150 feet activity     Assist      Assist Level: Moderate Assistance - Patient 50 - 74%   Blood pressure 110/73, pulse (!) 109, temperature 97.7 F (36.5 C), resp. rate 16, height 5\' 10"  (1.778 m), weight 90.7 kg, SpO2 100 %.  Medical Problem List and Plan: 1.  ?New left-sided weakness with dysarthria secondary to right MCA scattered and single punctate right cerebellar infarct with right M1 occlusion status post right M1 thrombectomy.   -Pt has history of L MCA stroke with chronic right-sided residual weakness             -patient may  shower             -ELOS/Goals: 2.5-3 weeks- Supervision   -team conference today 5/25 2.  Antithrombotics: -DVT/anticoagulation: continue with sq heparin.  -recent dopplers on 5/20 were clear             -antiplatelet therapy: Aspirin 81 mg daily and Plavix 75 mg daily 3. Pain Management: Tylenol as needed 4. Mood: Provide emotional support             -antipsychotic agents: N/A 5. Neuropsych: This patient is capable of making decisions on his own behalf. 6. Skin/Wound Care: Routine skin checks  -eucerin cream/vaseline for dry skin 7. Fluids/Electrolytes/Nutrition: encourage appropriate PO 8.  Dysphagia.  Follow-up speech therapy.  Presently on dysphagia #2 thin liquid diet.  -tolerating diet well, needs to take his time 9.  Hypertension.  Lisinopril 40 mg daily decreased to 20mg  on 5/23  -5/25 bp's better today, continue same regimen 10.  Seizure prophylaxis.  Keppra 500 mg twice daily.  EEG negative 11.  Hyperlipidemia.  Lipitor 12.  Morbid obesity?   BMI only 28.7 13.  New findings diabetes mellitus.  Hemoglobin A1c 7.4.  SSI. 14.  Tobacco abuse.  Counseling 15.  Medical  noncompliance.  Counseling 16. Constipation   - senokot 2 tabs daily and has sorbitol prn.   5/24- 3 large bm's, continue current regimen      LOS: 3 days A FACE TO FACE EVALUATION WAS PERFORMED  01-04-1971 11/06/2019, 9:24 AM

## 2019-11-06 NOTE — Progress Notes (Signed)
Patient ID: Jeffery Reilly, male   DOB: Oct 04, 1945, 74 y.o.   MRN: [id number]   SW received call from pt wife Lissa Hoard stating she and dtr can be here for family education on Wednesday, June 2 10am-12pm. States would like to see if able to get new pt appt with Dr. Karma Greaser [phone]). SW to follow-up.  Cecile Sheerer, MSW, LCSWA Office: [phone] Cell: [phone] Fax: [phone]

## 2019-11-06 NOTE — Progress Notes (Signed)
Speech Language Pathology Daily Session Note  Patient Details  Name: Jeffery Reilly MRN: [id number] Date of Birth: [birth year]  Today's Date: 11/06/2019 SLP Individual Time: 0254-2706 SLP Individual Time Calculation (min): 42 min  Short Term Goals: Week 1: SLP Short Term Goal 1 (Week 1): Pt will consume current diet, demonstrating efficient mastication and oral clearance with Mod A cues for ues of compensatory swallow strategies. SLP Short Term Goal 2 (Week 1): Pt will consume trials of upgraded dysphagia 3 solids with efficient mastication and oral clearance X3 prior to advancement. SLP Short Term Goal 3 (Week 1): Pt will use strategies to increase intelligibility to 60% at the phrase level with Max A cues. SLP Short Term Goal 4 (Week 1): Pt will demonstrate ability to recall new and/or daily information with Max A for use of compensatory strategies and/or aids. SLP Short Term Goal 5 (Week 1): Pt will demonstrate ability to problem solve functional basic situaiton with Mod A verbal/visual cues. SLP Short Term Goal 6 (Week 1): Pt will identify acute impairments (1 physical and 1 cognitive impairment) impacting his daily functioning with Max A cues.  Skilled Therapeutic Interventions: Skilled ST services focused on cognitive skills. Pt demonstrated recall of 1 out 2 swallow strategies, but with a verbal cues could recall 2 out 2 strategies. SLP facilitated basic problem solving, awareness of errors and sustained attention in novel card task (Blink), pt required max A verbal cues for problem solving fading to less cuing, but within max range 3/4th way through task. Pt sorted cards by 6 colors (with various shapes and number of shapes present), pt required cuing in 2 minute intervals for sustained attention and recall with visual aid as to the method of sorting. Pt expressed surprised by his performance in both task and demonstrated increase intellectual awareness naming 1 cognitive and 1 physical deficits  from acute CVA with mod A verbal cues. Pt was left in room with call bell within reach and chair alarm set. ST recommends to continue skilled ST services.      Pain Pain Assessment Pain Score: 0-No pain  Therapy/Group: Individual Therapy  Janyra Barillas  [facility] 11/06/2019, 4:11 PM

## 2019-11-06 NOTE — Plan of Care (Signed)
  Problem: RH SAFETY Goal: RH STG ADHERE TO SAFETY PRECAUTIONS W/ASSISTANCE/DEVICE Description: STG Adhere to Safety Precautions With moderate Assistance/Device. Outcome: Progressing Goal: RH STG DECREASED RISK OF FALL WITH ASSISTANCE Description: STG Decreased Risk of Fall With moderate Assistance. Outcome: Progressing   Problem: RH COGNITION-NURSING Goal: RH STG USES MEMORY AIDS/STRATEGIES W/ASSIST TO PROBLEM SOLVE Description: STG Uses Memory Aids/Strategies With moderate Assistance to Problem Solve. Outcome: Progressing Goal: RH STG ANTICIPATES NEEDS/CALLS FOR ASSIST W/ASSIST/CUES Description: STG Anticipates Needs/Calls for Assist With Assistance/Cues. Outcome: Progressing

## 2019-11-06 NOTE — Progress Notes (Signed)
Physical Therapy Session Note  Patient Details  Name: Jeffery Reilly MRN: [id number] Date of Birth: 1945-12-01  Today's Date: 11/06/2019 PT Individual Time: 0901-0955 PT Individual Time Calculation (min): 54 min   Short Term Goals: Week 1:  PT Short Term Goal 1 (Week 1): Patient will perform basic transfers with CGA. PT Short Term Goal 2 (Week 1): Patient will ambulate >150 ft with CGA. PT Short Term Goal 3 (Week 1): Patient will perform dynamic balance activities >3 min with CGA. PT Short Term Goal 4 (Week 1): Patient will perform 4-6" steps with min A.  Skilled Therapeutic Interventions/Progress Updates:   Pt in supine and agreeable to therapy, no c/o pain. Supine>sit w/ CGA and increased time, encouraged to move at more functional pace but pt very slow. Donned pants w/ max assist to thread, mod assist to pull over hips once in standing. Sit>stand from bed w/ min-mod assist. Ambulated to/from therapy gym w/ RW, emphasis on increasing to more functional gait speed, increased step length, and upright posture. CGA w/ verbal cues for gait pattern. Worked on blocked practice of sit<>stands from elevated mat surface, progressively lower surface. All performed w/ CGA and verbal and tactile cues for anterior weight shifting, "nose over toes", foot placement, and for anterior weight shifting at hips once stabilizing in stance. Performed 25+ in total w/ intermittent rest breaks. Will need to assess for carryover in later sessions. Ambulated back to room (same cues as above), ended session in recliner, all needs in reach.   Therapy Documentation Precautions:  Precautions Precautions: Fall Precaution Comments: Mild Rt hemi Restrictions Weight Bearing Restrictions: No  Therapy/Group: Individual Therapy  Wendie Diskin K Iasia Forcier 11/06/2019, 10:05 AM

## 2019-11-06 NOTE — IPOC Note (Signed)
Overall Plan of Care [facility]) Patient Details Name: Jeffery Reilly MRN: [id number] DOB: 1945-08-06  Admitting Diagnosis: Right middle cerebral artery stroke [facility])  Hospital Problems: Principal Problem:   Right middle cerebral artery stroke [facility])     Functional Problem List: Nursing Behavior, Bladder, Bowel, Endurance, Medication Management, Motor, Nutrition, Perception, Safety, Skin Integrity, Sensory  PT Balance, Behavior, Edema, Motor, Nutrition, Endurance, Pain, Perception, Safety, Sensory, Skin Integrity  OT Balance, Safety, Cognition, Endurance, Motor  SLP Linguistic, Nutrition, Cognition  TR         Basic ADL's: OT Bathing, Dressing, Toileting     Advanced  ADL's: OT Simple Meal Preparation     Transfers: PT Bed Mobility, Bed to Chair, Furniture, Musician, Floor  OT Toilet, Metallurgist: PT Ambulation, Emergency planning/management officer, Stairs     Additional Impairments: OT None  SLP Swallowing, Communication, Social Cognition expression Problem Solving, Memory, Attention, Awareness  TR      Anticipated Outcomes Item Anticipated Outcome  Self Feeding No goal  Swallowing  Supervision A   Basic self-care  Media planner Transfers Supervision  Bowel/Bladder  mod I  Transfers  Supervision using LRAD  Locomotion  Supervision >200 ft using LRAD  Communication  Min A  Cognition  Min A  Pain  less than 2  Safety/Judgment  mod I   Therapy Plan: PT Intensity: Minimum of 1-2 x/day ,45 to 90 minutes PT Frequency: 5 out of 7 days PT Duration Estimated Length of Stay: 2-3 weeks OT Intensity: Minimum of 1-2 x/day, 45 to 90 minutes OT Frequency: 5 out of 7 days OT Duration/Estimated Length of Stay: 2.5-3 weeks SLP Intensity: Minumum of 1-2 x/day, 30 to 90 minutes SLP Frequency: 3 to 5 out of 7 days SLP Duration/Estimated Length of Stay: 2.5-3 weeks   Due to the current state of emergency, patients may not be receiving their  3-hours of Medicare-mandated therapy.   Team Interventions: Nursing Interventions Patient/Family Education, Bladder Management, Bowel Management, Disease Management/Prevention, Cognitive Remediation/Compensation, Skin Care/Wound Management, Medication Management, Dysphagia/Aspiration Precaution Training  PT interventions Ambulation/gait training, Community reintegration, DME/adaptive equipment instruction, Neuromuscular re-education, Psychosocial support, Stair training, UE/LE Strength taining/ROM, Wheelchair propulsion/positioning, Training and development officer, Discharge planning, Functional electrical stimulation, Pain management, Skin care/wound management, Therapeutic Activities, UE/LE Coordination activities, Cognitive remediation/compensation, Disease management/prevention, Functional mobility training, Patient/family education, Splinting/orthotics, Therapeutic Exercise, Visual/perceptual remediation/compensation  OT Interventions Balance/vestibular training, Discharge planning, Pain management, Self Care/advanced ADL retraining, Therapeutic Activities, UE/LE Coordination activities, Therapeutic Exercise, Patient/family education, Functional mobility training, Disease mangement/prevention, Cognitive remediation/compensation, Community reintegration, Engineer, drilling, Neuromuscular re-education, Psychosocial support, UE/LE Strength taining/ROM  SLP Interventions Cognitive remediation/compensation, Cueing hierarchy, Dysphagia/aspiration precaution training, Functional tasks, Patient/family education, Internal/external aids, Speech/Language facilitation  TR Interventions    SW/CM Interventions Discharge Planning, Psychosocial Support, Patient/Family Education   Barriers to Discharge MD  Medical stability  Nursing Other (comments), Decreased caregiver support, Lack of/limited family support Anxious, crying. Wants wife and two daughters to come and visit him while in the hospital.  PT  Inaccessible home environment, Medication compliance 4 steps to enter home, history of non-compliance w/ medications, decreased awareness of deficits  OT Home environment access/layout    SLP      SW Decreased caregiver support, Lack of/limited family support wife is primary caregiver   Team Discharge Planning: Destination: PT-Home ,OT- Home , SLP-Home Projected Follow-up: PT-Outpatient PT, OT-  Home health OT, 24 hour supervision/assistance, SLP-Home Health SLP, 24 hour supervision/assistance Projected Equipment Needs: PT-To  be determined, OT- To be determined, SLP-None recommended by SLP Equipment Details: PT-Patient has RW and SPC, will determine other DME needs based on patient's progress, OT-  Patient/family involved in discharge planning: PT- Patient,  OT-Patient, SLP-Patient  MD ELOS: 2-3 weeks Medical Rehab Prognosis:  Good Assessment: The patient has been admitted for CIR therapies with the diagnosis of right MCA infarct, right cerebellar infarct in setting of prior left MCA infarct. The team will be addressing functional mobility, strength, stamina, balance, safety, adaptive techniques and equipment, self-care, bowel and bladder mgt, patient and caregiver education, NMR, speech, swallowing, communication, cognition, community reentry. Goals have been set at supervision for self-care and mobility and min assist for communication and cognition.   Due to the current state of emergency, patients may not be receiving their 3 hours per day of Medicare-mandated therapy.    Ranelle Oyster, MD, FAAPMR      See Team Conference Notes for weekly updates to the plan of care

## 2019-11-07 ENCOUNTER — Inpatient Hospital Stay (HOSPITAL_COMMUNITY): Payer: Medicare Other

## 2019-11-07 ENCOUNTER — Inpatient Hospital Stay (HOSPITAL_COMMUNITY): Payer: Medicare Other | Admitting: Physical Therapy

## 2019-11-07 ENCOUNTER — Inpatient Hospital Stay (HOSPITAL_COMMUNITY): Payer: Medicare Other | Admitting: Speech Pathology

## 2019-11-07 NOTE — Progress Notes (Signed)
Physical Therapy Session Note  Patient Details  Name: Jeffery Reilly MRN: [id number] Date of Birth: 07/23/1945  Today's Date: 11/07/2019 PT Individual Time: 1620-1650 PT Individual Time Calculation (min): 30 min   Short Term Goals: Week 1:  PT Short Term Goal 1 (Week 1): Patient will perform basic transfers with CGA. PT Short Term Goal 2 (Week 1): Patient will ambulate >150 ft with CGA. PT Short Term Goal 3 (Week 1): Patient will perform dynamic balance activities >3 min with CGA. PT Short Term Goal 4 (Week 1): Patient will perform 4-6" steps with min A.  Skilled Therapeutic Interventions/Progress Updates:   Pt received supine in bed and agreeable to PT. Supine>sit transfer with supervision assist and min cues for trunk control and use of BUE. Gait training in hall with RW 2 x 100ft with CGA-supervision assist for safety and min cues for step height on the R. Pt instructed in dynamic balance/visual scanning task of dynavision, program A, 1 min x 3 bouts, 8. 12. 13. Min cues for visual scanning R. Pt returned to room and performed ambulatory transfer to bed with supervision assist and RW. Sit>supine completed with supervision assist, and pt left supine in bed with call bell in reach and all needs met.        Therapy Documentation Precautions:  Precautions Precautions: Fall Precaution Comments: Mild Rt hemi Restrictions Weight Bearing Restrictions: No Vital Signs: Therapy Vitals Temp: 98.8 F (37.1 C) Pulse Rate: 73 Resp: 17 BP: 99/67 Patient Position (if appropriate): Lying Oxygen Therapy SpO2: 100 % O2 Device: Room Air Pain: denies   Therapy/Group: Individual Therapy  Austin E Tucker 11/07/2019, 4:52 PM  

## 2019-11-07 NOTE — Progress Notes (Signed)
Nutrition Follow-up  RD working remotely.  DOCUMENTATION CODES:   Not applicable  INTERVENTION:   - Ensure Enlive po BID, each supplement provides 350 kcal and 20 grams of protein  - MVI with minerals  - Encourage PO intake  NUTRITION DIAGNOSIS:   Increased nutrient needs related to other (therapies) as evidenced by estimated needs.  Ongoing, being addressed via supplements  GOAL:   Patient will meet greater than or equal to 90% of their needs  Progressing  MONITOR:   PO intake, Supplement acceptance, Diet advancement, Labs, Weight trends, Skin  REASON FOR ASSESSMENT:   Malnutrition Screening Tool    ASSESSMENT:   74 year old male with PMH of CVA with residual right-sided weakness, tobacco abuse. Presented 10/30/19 with aphasia as well as AMS and left-sided weakness as well as dysarthria. Cranial CT scan showed advanced chronic small vessel disease no changes. CT angiogram of head and neck distal right M1 MCA occlusion. Pt underwent emergent thrombectomy of right MCA M1 occlusion with rescue stenting. A follow-up MRI showed scattered foci of restricted diffusion throughout the right MCA territory with a cluster at the temporal occipital region consistent with acute/subacute infarction. Admitted to CIR on 5/22.  Unable to reach pt via phone call to room. Pt accepting 100% of Ensure Enlive supplements per [facility] documentation. Weight stable. Will continue with current plan of care.  Meal Completion: 40-75% x last 8 meals  Medications reviewed and include: Ensure Enlive BID, MVI, senna  Labs reviewed: elevated LFTs  NUTRITION - FOCUSED PHYSICAL EXAM:  Unable to complete at this time. RD working remotely.  Diet Order:   Diet Order            DIET DYS 2 Room service appropriate? Yes; Fluid consistency: Thin  Diet effective now              EDUCATION NEEDS:   No education needs have been identified at this time  Skin:  Skin Assessment: Reviewed RN Assessment  (MASD to buttocks)  Last BM:  11/06/19 medium type 3  Height:   Ht Readings from Last 1 Encounters:  11/03/19 5\' 10"  (1.778 m)    Weight:   Wt Readings from Last 1 Encounters:  11/05/19 90.7 kg    Ideal Body Weight:  75.5 kg  BMI:  Body mass index is 28.69 kg/m.  Estimated Nutritional Needs:   Kcal:  2000-2200  Protein:  100-115 grams  Fluid:  >/= 2.0 L    11/07/19, MS, RD, LDN Inpatient Clinical Dietitian Pager: [phone] Weekend/After Hours: [phone]

## 2019-11-07 NOTE — Progress Notes (Signed)
Speech Language Pathology Daily Session Note  Patient Details  Name: Jeffery Reilly MRN: [id number] Date of Birth: 1945/08/05  Today's Date: 11/07/2019 SLP Individual Time: 4403-4742 SLP Individual Time Calculation (min): 55 min  Short Term Goals: Week 1: SLP Short Term Goal 1 (Week 1): Pt will consume current diet, demonstrating efficient mastication and oral clearance with Mod A cues for ues of compensatory swallow strategies. SLP Short Term Goal 2 (Week 1): Pt will consume trials of upgraded dysphagia 3 solids with efficient mastication and oral clearance X3 prior to advancement. SLP Short Term Goal 3 (Week 1): Pt will use strategies to increase intelligibility to 60% at the phrase level with Max A cues. SLP Short Term Goal 4 (Week 1): Pt will demonstrate ability to recall new and/or daily information with Max A for use of compensatory strategies and/or aids. SLP Short Term Goal 5 (Week 1): Pt will demonstrate ability to problem solve functional basic situaiton with Mod A verbal/visual cues. SLP Short Term Goal 6 (Week 1): Pt will identify acute impairments (1 physical and 1 cognitive impairment) impacting his daily functioning with Max A cues.  Skilled Therapeutic Interventions: Skilled treatment session focused on cognitive goals. SLP facilitated session by providing overall Mod-Max A verbal cues for functional problem solving during a novel, complex medication management task while organizing a BID pill box. SLP also provided patient with a pair of glasses that maximized his ability to read and utilize his schedule to anticipate upcoming therapy sessions. Patient left upright in recliner with alarm on and all needs within reach. Continue with current plan of care.      Pain No/Denies Pain   Therapy/Group: Individual Therapy  Sami Roes 11/07/2019, 9:47 AM

## 2019-11-07 NOTE — Plan of Care (Signed)
  Problem: RH SAFETY Goal: RH STG ADHERE TO SAFETY PRECAUTIONS W/ASSISTANCE/DEVICE Description: STG Adhere to Safety Precautions With moderate Assistance/Device. Outcome: Progressing Goal: RH STG DECREASED RISK OF FALL WITH ASSISTANCE Description: STG Decreased Risk of Fall With moderate Assistance. Outcome: Progressing   Problem: RH KNOWLEDGE DEFICIT Goal: RH STG INCREASE KNOWLEDGE OF DIABETES Description: Moderate assistance Outcome: Progressing Goal: RH STG INCREASE KNOWLEDGE OF HYPERTENSION Description: Moderate assistance Outcome: Progressing Goal: RH STG INCREASE KNOWLEDGE OF DYSPHAGIA/FLUID INTAKE Description: Moderate assistance Outcome: Progressing Goal: RH STG INCREASE KNOWLEGDE OF HYPERLIPIDEMIA Description: Moderate assistance Outcome: Progressing Goal: RH STG INCREASE KNOWLEDGE OF STROKE PROPHYLAXIS Description: Moderate assistance Outcome: Progressing

## 2019-11-07 NOTE — Progress Notes (Signed)
Occupational Therapy Session Note  Patient Details  Name: Jeffery Reilly MRN: [id number] Date of Birth: [birth year]  Today's Date: 11/07/2019 OT Individual Time: 5053-9767 OT Individual Time Calculation (min): 60 min    Short Term Goals: Week 1:  OT Short Term Goal 1 (Week 1): Pt will complete toilet transfers with Min A sit<stand OT Short Term Goal 2 (Week 1): Pt will complete shower transfer with Min A using LRAD OT Short Term Goal 3 (Week 1): Pt will complete 2 grooming tasks while standing at the sink to improve standing balance and standing endurance  Skilled Therapeutic Interventions/Progress Updates:    Pt received in recliner agreeable to session, no c/o pain. Pt completed sit > stand throughout session with min A, one instance of mod A from shower bench with slippery floor. Pt used RW with mod cueing for technique to complete functional mobility into the bathroom with min A- CGA. Pt required min A to doff clothing distally and to wash feet in the shower. Min cueing for task progression in shower and for increased use of R UE. Pt able to wash buttocks in standing with min A. Pt returned to recliner and donned shirt with CGA. Min A to don pants. Assist provided for foot care, including drying and applying lotion, pt has very flaky dry skin. Pt donned socks with min A. Pt was left sitting up in the recliner with the chair alarm set.   Therapy Documentation Precautions:  Precautions Precautions: Fall Precaution Comments: Mild Rt hemi Restrictions Weight Bearing Restrictions: No  Therapy/Group: Individual Therapy  Crissie Reese 11/07/2019, 6:34 AM

## 2019-11-07 NOTE — Progress Notes (Signed)
Physical Therapy Session Note  Patient Details  Name: Jeffery Reilly MRN: [id number] Date of Birth: 1946-01-10  Today's Date: 11/07/2019 PT Individual Time: 1425-1524 PT Individual Time Calculation (min): 59 min   Short Term Goals: Week 1:  PT Short Term Goal 1 (Week 1): Patient will perform basic transfers with CGA. PT Short Term Goal 2 (Week 1): Patient will ambulate >150 ft with CGA. PT Short Term Goal 3 (Week 1): Patient will perform dynamic balance activities >3 min with CGA. PT Short Term Goal 4 (Week 1): Patient will perform 4-6" steps with min A.  Skilled Therapeutic Interventions/Progress Updates:   Pt in recliner and agreeable to therapy, no c/o pain. Ambulated to day room, 150' and 100' w/ CGA using RW to work on gait training and functional endurance, verbal and visual cues to increased R foot clearance and step length, however notably increased speed today. Per pt, his mild R foot drag is baseline from previous stroke. Donned R foot-up brace and ambulated 42' again w/ CGA using RW, much improved R foot clearance and pt immediately noticed a difference. Ambulated 50' w/o AD w/ CGA-min assist w/ improved R foot clearance as well and pt w/ increased focus on R foot as he has decreased lateral weight shifting when using AD. Performed NuStep 10 min @ level 1 to work on endurance, strengthening, and reciprocal movement pattern. Ambulated back to room, 250' in 2 bouts again, CGA w/ RW and only occasional cues to increased R foot clearance. Ended session in supine, all needs in reach.   Therapy Documentation Precautions:  Precautions Precautions: Fall Precaution Comments: Mild Rt hemi Restrictions Weight Bearing Restrictions: No  Therapy/Group: Individual Therapy  Shloima Clinch Melton Krebs 11/07/2019, 3:26 PM

## 2019-11-07 NOTE — Progress Notes (Signed)
[facility] PHYSICAL MEDICINE & REHABILITATION PROGRESS NOTE   Subjective/Complaints: Slept poorly last night but prefers to have no sleep aides. Denies constipation, pain About to work with SLO Vitals stable  ROS: Patient denies fever, rash, sore throat, blurred vision, nausea, vomiting, diarrhea, cough, shortness of breath or chest pain, joint or back pain, headache, or mood change.   Objective:   No results found. Recent Labs    11/05/19 0948  WBC 5.7  HGB 14.4  HCT 46.2  PLT 189   Recent Labs    11/05/19 0948  NA 137  K 4.3  CL 104  CO2 22  GLUCOSE 130*  BUN 19  CREATININE 1.02  CALCIUM 8.8*    Intake/Output Summary (Last 24 hours) at 11/07/2019 0854 Last data filed at 11/07/2019 0730 Gross per 24 hour  Intake 290 ml  Output 400 ml  Net -110 ml     Physical Exam: Vital Signs Blood pressure 115/66, pulse (!) 59, temperature 97.8 F (36.6 C), temperature source Oral, resp. rate 16, height 5\' 10"  (1.778 m), weight 90.7 kg, SpO2 100 %.  Physical Exam  Constitutional: No distress . Vital signs reviewed. Sitting up in chair comfortably.  HEENT: EOMI, oral membranes moist Neck: supple Cardiovascular: RRR without murmur. No JVD    Respiratory/Chest: CTA Bilaterally without wheezes or rales. Normal effort    GI/Abdomen: BS +, non-tender, non-distended Ext: no clubbing, cyanosis, or edema Psych: pleasant and cooperative Musc: no swelling or pain Neurological: He is alert and oriented x 3. Very dysarthric.  Sensation intact to light touch x 4 extremities. Very dysarthric Apraxia oral, verbal and limb apraxia ongoing RUE 4/5 with PD. RLE 3+ to 4/5. LUE and LLE 5/5. Decreased LT RUE and RLE.  Skin: Skin is warm and dry.  Venous stasis changes in skin of legs stable   Assessment/Plan: 1. Functional deficits secondary to R MCA stroke which require 3+ hours per day of interdisciplinary therapy in a comprehensive inpatient rehab setting.  Physiatrist is providing  close team supervision and 24 hour management of active medical problems listed below.  Physiatrist and rehab team continue to assess barriers to discharge/monitor patient progress toward functional and medical goals  Care Tool:  Bathing    Body parts bathed by patient: Right arm, Left arm, Chest, Abdomen, Front perineal area, Right upper leg, Left upper leg, Face, Buttocks, Right lower leg   Body parts bathed by helper: Left lower leg     Bathing assist Assist Level: Minimal Assistance - Patient > 75%     Upper Body Dressing/Undressing Upper body dressing   What is the patient wearing?: Pull over shirt    Upper body assist Assist Level: Contact Guard/Touching assist    Lower Body Dressing/Undressing Lower body dressing      What is the patient wearing?: Incontinence brief, Pants     Lower body assist Assist for lower body dressing: Minimal Assistance - Patient > 75%     Toileting Toileting    Toileting assist Assist for toileting: Maximal Assistance - Patient 25 - 49%     Transfers Chair/bed transfer  Transfers assist     Chair/bed transfer assist level: Minimal Assistance - Patient > 75%     Locomotion Ambulation   Ambulation assist      Assist level: Contact Guard/Touching assist Assistive device: Walker-rolling Max distance: 150'   Walk 10 feet activity   Assist     Assist level: Contact Guard/Touching assist Assistive device: Walker-rolling   Walk  50 feet activity   Assist    Assist level: Contact Guard/Touching assist Assistive device: Walker-rolling    Walk 150 feet activity   Assist    Assist level: Contact Guard/Touching assist Assistive device: Walker-rolling    Walk 10 feet on uneven surface  activity   Assist Walk 10 feet on uneven surfaces activity did not occur: Safety/medical concerns(decreased balance, motor planning, and R foot clearance)         Wheelchair     Assist   Type of Wheelchair: Manual     Wheelchair assist level: Supervision/Verbal cueing, Moderate Assistance - Patient 50 - 74% Max wheelchair distance: 150'    Wheelchair 50 feet with 2 turns activity    Assist        Assist Level: Supervision/Verbal cueing   Wheelchair 150 feet activity     Assist      Assist Level: Moderate Assistance - Patient 50 - 74%   Blood pressure 115/66, pulse (!) 59, temperature 97.8 F (36.6 C), temperature source Oral, resp. rate 16, height 5\' 10"  (1.778 m), weight 90.7 kg, SpO2 100 %.  Medical Problem List and Plan: 1.  ?New left-sided weakness with dysarthria secondary to right MCA scattered and single punctate right cerebellar infarct with right M1 occlusion status post right M1 thrombectomy.   -Pt has history of L MCA stroke with chronic right-sided residual weakness             -patient may  shower             -ELOS/Goals: 2.5-3 weeks- Supervision  -Continue CIR 2.  Antithrombotics: -DVT/anticoagulation: continue with sq heparin.  -recent dopplers on 5/20 were clear             -antiplatelet therapy: Aspirin 81 mg daily and Plavix 75 mg daily 3. Pain Management: Tylenol as needed. Well controlled.  4. Mood: Provide emotional support             -antipsychotic agents: N/A 5. Neuropsych: This patient is capable of making decisions on his own behalf. 6. Skin/Wound Care: Routine skin checks  -eucerin cream/vaseline for dry skin 7. Fluids/Electrolytes/Nutrition: encourage appropriate PO 8.  Dysphagia.  Follow-up speech therapy.  Presently on dysphagia #2 thin liquid diet.  -tolerating diet well, needs to take his time 9.  Hypertension.  Lisinopril 40 mg daily decreased to 20mg  on 5/23  -5/26 bp's well controlled, continue same regimen 10.  Seizure prophylaxis.  Keppra 500 mg twice daily.  EEG negative 11.  Hyperlipidemia.  Lipitor 12.  Morbid obesity?   BMI only 28.7 13.  New findings diabetes mellitus.  Hemoglobin A1c 7.4.  SSI. CBs have been moderately well  controlled.  14.  Tobacco abuse.  Counseling 15.  Medical noncompliance.  Counseling 16. Constipation   - senokot 2 tabs daily and has sorbitol prn.   5/24- 3 large bm's, continue current regimen      LOS: 4 days A FACE TO FACE EVALUATION WAS PERFORMED  04-08-1972 P Raulkar 11/07/2019, 8:54 AM

## 2019-11-08 ENCOUNTER — Inpatient Hospital Stay (HOSPITAL_COMMUNITY): Payer: Medicare Other | Admitting: Speech Pathology

## 2019-11-08 ENCOUNTER — Inpatient Hospital Stay (HOSPITAL_COMMUNITY): Payer: Medicare Other | Admitting: Physical Therapy

## 2019-11-08 ENCOUNTER — Inpatient Hospital Stay (HOSPITAL_COMMUNITY): Payer: Medicare Other

## 2019-11-08 NOTE — Progress Notes (Signed)
Patient ID: Jeffery Reilly, male   DOB: Nov 28, 1945, 74 y.o.   MRN: [id number]   SW spoke with Nicole/[facility]  [phone]) to schedule new patient appointment/hospital follow-up; schedules for Monday, June 14th @9 :45am with , NP. D/c instructions updated.   [name], MSW, LCSWA Office: [phone] Cell: [phone] Fax: [phone]

## 2019-11-08 NOTE — Progress Notes (Signed)
Physical Therapy Session Note  Patient Details  Name: Anwar Sakata MRN: [id number] Date of Birth: January 27, 1946  Today's Date: 11/08/2019 PT Individual Time: 0915-1015 AND 1505-1530 PT Individual Time Calculation (min): 60 min AND 25 min  Short Term Goals: Week 1:  PT Short Term Goal 1 (Week 1): Patient will perform basic transfers with CGA. PT Short Term Goal 2 (Week 1): Patient will ambulate >150 ft with CGA. PT Short Term Goal 3 (Week 1): Patient will perform dynamic balance activities >3 min with CGA. PT Short Term Goal 4 (Week 1): Patient will perform 4-6" steps with min A.  Skilled Therapeutic Interventions/Progress Updates:   Session 1:  Pt in recliner and agreeable to therapy, no c/o pain. Switched from hospital socks to regular socks w/ max assist, verbal and visual cues for technique. Donned shoes w/ total assist for time management. Ambulated to/from therapy gym w/ RW and CGA, R foot-up brace but verbal and visual cues still needed to increase R foot clearance and step length. Worked on gait w/o AD, ambulated 100' and 25' w/ CGA-min assist, occasional min manual assist for L lateral weight shifting during R swing. Practiced negotiating 4 steps w/ B rails, as per home set-up, performed w/ CGA and verbal/visual cues for technique. Worked on postural control and dynamic standing balance remainder of session. Blocked practice of sit<>stands from chair w/ armrests x8 reps w/ CGA, verbal and tactile cues for weight shifting. Emphasis on achieving and maintaining upright stance w/o UE support to stabilize. Performed BUE reaching task while pinning/unpinning clothespins to basketball hoop. Tasks required trunk rotation, lateral weight shifting, and mini-squats. Performed x2 reps in standing w/ progressively lower BOS, CGA overall. Worked on anticipatory postural control while tossing beach ball w/ 2nd helper, CGA-min assist x4 min. Ambulated back to room w/ same cues/assistance as detailed above w/  RW. Ended session in recliner, all needs in reach.   Session 2:  Pt in recliner and agreeable to therapy, no c/o pain. Sit>stand to RW w/ min assist and ambulated to/from therapy gym w/ CGA-close supervision w/ RW, ~100' each way. Worked on sit<>stand technique from progressively lower mat surface. Performed w/o UE push up, min fading to CGA w/ each level of mat. Performed 5 reps at each level, 25 sit<>stands in total. Verbal, visual, and tactile cues for anterior trunk lean and "nose-over-toes" w/ all reps. Ambulated back to room. Ended session in supine, all needs in reach.   Therapy Documentation Precautions:  Precautions Precautions: Fall Precaution Comments: Mild Rt hemi Restrictions Weight Bearing Restrictions: No  Therapy/Group: Individual Therapy  Abimael Zeiter Melton Krebs 11/08/2019, 10:16 AM

## 2019-11-08 NOTE — Progress Notes (Signed)
[facility] PHYSICAL MEDICINE & REHABILITATION PROGRESS NOTE   Subjective/Complaints: No specific hospital complaints. Was concerned about a bank account of some sort he needed access to at home  ROS: Limited due to speech, intelligibility  .   Objective:   No results found. Recent Labs    11/05/19 0948  WBC 5.7  HGB 14.4  HCT 46.2  PLT 189   Recent Labs    11/05/19 0948  NA 137  K 4.3  CL 104  CO2 22  GLUCOSE 130*  BUN 19  CREATININE 1.02  CALCIUM 8.8*    Intake/Output Summary (Last 24 hours) at 11/08/2019 1610 Last data filed at 11/08/2019 0800 Gross per 24 hour  Intake 400 ml  Output 250 ml  Net 150 ml     Physical Exam: Vital Signs Blood pressure 106/67, pulse 60, temperature 98.8 F (37.1 C), resp. rate 17, height 5\' 10"  (1.778 m), weight 90.7 kg, SpO2 97 %.  Physical Exam  Constitutional: No distress . Vital signs reviewed. HEENT: EOMI, oral membranes moist Neck: supple Cardiovascular: RRR without murmur. No JVD    Respiratory/Chest: CTA Bilaterally without wheezes or rales. Normal effort    GI/Abdomen: BS +, non-tender, non-distended Ext: no clubbing, cyanosis, or edema Psych: pleasant but a little anxious Musc: no swelling or pain Neurological: He is alert and oriented x 3. Very dysarthric.  Sensation intact to light touch x 4 extremities. Remains very Apraxia oral, verbal and limb apraxia ongoing RUE 4/5 with PD. RLE 3+ to 4/5. LUE and LLE 5/5. Decreased LT RUE and RLE.  Skin: Skin is warm and dry. chronic venous changes   Assessment/Plan: 1. Functional deficits secondary to R MCA stroke which require 3+ hours per day of interdisciplinary therapy in a comprehensive inpatient rehab setting.  Physiatrist is providing close team supervision and 24 hour management of active medical problems listed below.  Physiatrist and rehab team continue to assess barriers to discharge/monitor patient progress toward functional and medical goals  Care  Tool:  Bathing    Body parts bathed by patient: Right arm, Left arm, Chest, Abdomen, Front perineal area, Right upper leg, Left upper leg, Face, Buttocks, Right lower leg   Body parts bathed by helper: Left lower leg     Bathing assist Assist Level: Minimal Assistance - Patient > 75%     Upper Body Dressing/Undressing Upper body dressing   What is the patient wearing?: Pull over shirt    Upper body assist Assist Level: Contact Guard/Touching assist    Lower Body Dressing/Undressing Lower body dressing      What is the patient wearing?: Incontinence brief, Pants     Lower body assist Assist for lower body dressing: Minimal Assistance - Patient > 75%     Toileting Toileting    Toileting assist Assist for toileting: Maximal Assistance - Patient 25 - 49%     Transfers Chair/bed transfer  Transfers assist     Chair/bed transfer assist level: Minimal Assistance - Patient > 75%     Locomotion Ambulation   Ambulation assist      Assist level: Contact Guard/Touching assist Assistive device: Walker-rolling Max distance: 150'   Walk 10 feet activity   Assist     Assist level: Contact Guard/Touching assist Assistive device: Walker-rolling   Walk 50 feet activity   Assist    Assist level: Contact Guard/Touching assist Assistive device: Walker-rolling    Walk 150 feet activity   Assist    Assist level: Contact Guard/Touching assist  Assistive device: Walker-rolling    Walk 10 feet on uneven surface  activity   Assist Walk 10 feet on uneven surfaces activity did not occur: Safety/medical concerns(decreased balance, motor planning, and R foot clearance)         Wheelchair     Assist   Type of Wheelchair: Manual    Wheelchair assist level: Supervision/Verbal cueing, Moderate Assistance - Patient 50 - 74% Max wheelchair distance: 150'    Wheelchair 50 feet with 2 turns activity    Assist        Assist Level:  Supervision/Verbal cueing   Wheelchair 150 feet activity     Assist      Assist Level: Moderate Assistance - Patient 50 - 74%   Blood pressure 106/67, pulse 60, temperature 98.8 F (37.1 C), resp. rate 17, height 5\' 10"  (1.778 m), weight 90.7 kg, SpO2 97 %.  Medical Problem List and Plan: 1.  ?New left-sided weakness with dysarthria secondary to right MCA scattered and single punctate right cerebellar infarct with right M1 occlusion status post right M1 thrombectomy.   -Pt has history of L MCA stroke with chronic right-sided residual weakness             -patient may  shower             -ELOS/Goals: 2.5-3 weeks- Supervision  -Continue CIR 2.  Antithrombotics: -DVT/anticoagulation: continue with sq heparin.  -recent dopplers on 5/20 were clear             -antiplatelet therapy: Aspirin 81 mg daily and Plavix 75 mg daily 3. Pain Management: Tylenol as needed. Well controlled.  4. Mood: Provide emotional support, has had period of anxiety.              -antipsychotic agents: N/A 5. Neuropsych: This patient is capable of making decisions on his own behalf. 6. Skin/Wound Care: Routine skin checks  -eucerin cream/vaseline for dry skin 7. Fluids/Electrolytes/Nutrition: encourage appropriate PO 8.  Dysphagia.  Follow-up speech therapy.  Presently on dysphagia #2 thin liquid diet.  -tolerating diet well, needs to take his time 9.  Hypertension.  Lisinopril 40 mg daily decreased to 20mg  on 5/23  -5/27 bp's well controlled, continue same regimen 10.  Seizure prophylaxis.  Keppra 500 mg twice daily.  EEG negative 11.  Hyperlipidemia.  Lipitor 12.  Morbid obesity?   BMI only 28.7 13.  New findings diabetes mellitus.  Hemoglobin A1c 7.4.  SSI.   CBGs have been moderately well controlled at present --no changes  -CM diet 14.  Tobacco abuse.  Counseling 15.  Medical noncompliance.  Counseling 16. Constipation   - senokot 2 tabs daily and has sorbitol prn.   -moving bowels currently       LOS: 5 days A FACE TO FACE EVALUATION WAS PERFORMED  6/23 11/08/2019, 9:21 AM

## 2019-11-08 NOTE — Progress Notes (Signed)
Speech Language Pathology Daily Session Note  Patient Details  Name: Jeffery Reilly MRN: [id number] Date of Birth: 30-Dec-1945  Today's Date: 11/08/2019 SLP Individual Time: 1030-1110 SLP Individual Time Calculation (min): 40 min  Short Term Goals: Week 1: SLP Short Term Goal 1 (Week 1): Pt will consume current diet, demonstrating efficient mastication and oral clearance with Mod A cues for ues of compensatory swallow strategies. SLP Short Term Goal 2 (Week 1): Pt will consume trials of upgraded dysphagia 3 solids with efficient mastication and oral clearance X3 prior to advancement. SLP Short Term Goal 3 (Week 1): Pt will use strategies to increase intelligibility to 60% at the phrase level with Max A cues. SLP Short Term Goal 4 (Week 1): Pt will demonstrate ability to recall new and/or daily information with Max A for use of compensatory strategies and/or aids. SLP Short Term Goal 5 (Week 1): Pt will demonstrate ability to problem solve functional basic situaiton with Mod A verbal/visual cues. SLP Short Term Goal 6 (Week 1): Pt will identify acute impairments (1 physical and 1 cognitive impairment) impacting his daily functioning with Max A cues.  Skilled Therapeutic Interventions: Skilled treatment session focused on speech goals. SLP facilitated session by providing education regarding speech intelligibility strategies. Patient utilized a slow rate of speech with overall Mod A verbal cues to achieve ~75% intelligibility at the sentence level during an informal conversation. Patient became tearful when talking about his daughter which also negatively impacted his speech intelligibility. Patient left upright in the recliner with alarm on and all needs within reach. Continue with current plan of care.      Pain Pain Assessment Pain Scale: 0-10 Pain Score: 0-No pain  Therapy/Group: Individual Therapy  Gilbert Manolis 11/08/2019, 11:34 AM

## 2019-11-08 NOTE — Progress Notes (Signed)
Occupational Therapy Session Note  Patient Details  Name: Jeffery Reilly MRN: [id number] Date of Birth: June 27, 1945  Today's Date: 11/08/2019 OT Individual Time: 0700-0800 OT Individual Time Calculation (min): 60 min    Short Term Goals: Week 1:  OT Short Term Goal 1 (Week 1): Pt will complete toilet transfers with Min A sit<stand OT Short Term Goal 2 (Week 1): Pt will complete shower transfer with Min A using LRAD OT Short Term Goal 3 (Week 1): Pt will complete 2 grooming tasks while standing at the sink to improve standing balance and standing endurance  Skilled Therapeutic Interventions/Progress Updates:    1:1. Pt received in bed reportin need to toilet and no pain. Pt completes sit to stand from low bed with MOD A initially and all other sit to stands with RW with MIN A overall. Pt completes toileting in standing for bladder void with VC for walking RW over toilet for stability. Pt completes functional mobility in room with RW with Vc/tactile cues for larger steps with RLEwhen it gets behind d/t poor motor planning. Pt completes dressing with set up for socks, CGA to doff pull over shirt, S to don pull over shirt and MIN A for sit to stand/VC R hemi dressing to don pants. Pt declines bathing this date and requests to eat breakfast. Pt sits in recliner for self feeding with increased time for mastication and VC for clearing oral cavity prior to taking another bite (25%) of time while consuming dysphagia 2 textures. No overt s/s of aspiration during meal. Pt with minimal distractions during meals with OT turning off TV for improved attention to meal and safe swallowing strategies. Exited session with pt seated in recliner, exit alarm on and call light in reach  Therapy Documentation Precautions:  Precautions Precautions: Fall Precaution Comments: Mild Rt hemi Restrictions Weight Bearing Restrictions: No General:   Vital Signs: Therapy Vitals Temp: 98.8 F (37.1 C) Pulse Rate: 60 Resp:  17 BP: 106/67 Patient Position (if appropriate): Lying Oxygen Therapy SpO2: 97 % O2 Device: Room Air Pain:   ADL: ADL Eating: Not assessed Grooming: Supervision/safety Where Assessed-Grooming: Edge of bed Upper Body Bathing: Supervision/safety Where Assessed-Upper Body Bathing: Edge of bed Lower Body Bathing: Maximal assistance Where Assessed-Lower Body Bathing: Edge of bed Upper Body Dressing: Minimal assistance Where Assessed-Upper Body Dressing: Edge of bed Lower Body Dressing: Dependent Where Assessed-Lower Body Dressing: Edge of bed Toileting: Maximal assistance Where Assessed-Toileting: Teacher, adult education: Moderate assistance Toilet Transfer Method: Ambulating(with RW) Tub/Shower Transfer: Not assessed Vision   Perception    Praxis   Exercises:   Other Treatments:     Therapy/Group: Individual Therapy  Shon Hale 11/08/2019, 6:52 AM

## 2019-11-09 ENCOUNTER — Inpatient Hospital Stay (HOSPITAL_COMMUNITY): Payer: Medicare Other

## 2019-11-09 ENCOUNTER — Inpatient Hospital Stay (HOSPITAL_COMMUNITY): Payer: Medicare Other | Admitting: Speech Pathology

## 2019-11-09 ENCOUNTER — Inpatient Hospital Stay (HOSPITAL_COMMUNITY): Payer: Medicare Other | Admitting: Physical Therapy

## 2019-11-09 MED ORDER — LISINOPRIL 10 MG PO TABS
10.0000 mg | ORAL_TABLET | Freq: Every day | ORAL | Status: DC
  Administered 2019-11-10 – 2019-11-12 (×2): 10 mg via ORAL
  Filled 2019-11-09 (×3): qty 1

## 2019-11-09 NOTE — Progress Notes (Addendum)
Patient ID: Jeffery Reilly, male   DOB: 1946/05/02, 74 y.o.   MRN: [id number]  SW left message for pt wife Jeffery Reilly [phone]) informing on new pt appointment scheduled with date/time.   *SW met with pt, pt wife, and pt dtr in room to discuss HHA preference (provided HHA lsit). No HHA preference.   SW sent referral to [facility] for HHPT/OT/ST. SW waiting on follow-up.   *HH referral accepted by [facility]. SW left message for pt wife to inform on above.   Loralee Pacas, MSW, [place] Office: [phone] Cell: [phone] Fax: [phone]

## 2019-11-09 NOTE — Plan of Care (Signed)
  Problem: RH SAFETY Goal: RH STG ADHERE TO SAFETY PRECAUTIONS W/ASSISTANCE/DEVICE Description: STG Adhere to Safety Precautions With moderate Assistance/Device. Outcome: Progressing Goal: RH STG DECREASED RISK OF FALL WITH ASSISTANCE Description: STG Decreased Risk of Fall With moderate Assistance. Outcome: Progressing   Problem: RH PAIN MANAGEMENT Goal: RH STG PAIN MANAGED AT OR BELOW PT'S PAIN GOAL Description: Pain less than 2 Outcome: Progressing

## 2019-11-09 NOTE — Progress Notes (Signed)
Occupational Therapy Session Note  Patient Details  Name: Jeffery Reilly MRN: [id number] Date of Birth: 06/18/1945  Today's Date: 11/09/2019 OT Individual Time: 0700-0757 OT Individual Time Calculation (min): 57 min    Short Term Goals: Week 1:  OT Short Term Goal 1 (Week 1): Pt will complete toilet transfers with Min A sit<stand OT Short Term Goal 2 (Week 1): Pt will complete shower transfer with Min A using LRAD OT Short Term Goal 3 (Week 1): Pt will complete 2 grooming tasks while standing at the sink to improve standing balance and standing endurance  Skilled Therapeutic Interventions/Progress Updates:    1;1. Pt received in bed reportin no pain and agreeable to full ADL at shower level. Pt completes all mobility/transfers with MIN A fading to CGA with VC for looking at RUE on RW or grab bar d/t poor proprioception and inattention to RUE. Pt completes toileting standing for bladder void with RW over toilet. Pt trasnfers into shower and bathes with MIN A for stnaidng balance and MOD VC for moving on from LB/legs to peri area and UB. Pt dresses with set up for shirt seated on commode in bathroom and MIN A for pants requiring increased A from yesterday tothread RLE d/t higher seat and increased difficulty bending down to LEs. Pt self feeds at recliner with min VC for small bites and slow rate of consumption/checking for oral clearance prior to taking another bite. Exited session with pt seated in recliner, exit alarm on and call light tin reach  Therapy Documentation Precautions:  Precautions Precautions: Fall Precaution Comments: Mild Rt hemi Restrictions Weight Bearing Restrictions: No General:   Vital Signs:  Pain:   ADL: ADL Eating: Not assessed Grooming: Supervision/safety Where Assessed-Grooming: Edge of bed Upper Body Bathing: Supervision/safety Where Assessed-Upper Body Bathing: Edge of bed Lower Body Bathing: Maximal assistance Where Assessed-Lower Body Bathing: Edge of  bed Upper Body Dressing: Minimal assistance Where Assessed-Upper Body Dressing: Edge of bed Lower Body Dressing: Dependent Where Assessed-Lower Body Dressing: Edge of bed Toileting: Maximal assistance Where Assessed-Toileting: Teacher, adult education: Moderate assistance Toilet Transfer Method: Ambulating(with RW) Tub/Shower Transfer: Not assessed Vision   Perception    Praxis   Exercises:   Other Treatments:     Therapy/Group: Individual Therapy  Shon Hale 11/09/2019, 7:46 AM

## 2019-11-09 NOTE — Progress Notes (Signed)
Speech Language Pathology Daily Session Note  Patient Details  Name: Jeffery Reilly MRN: [id number] Date of Birth: 1946/04/07  Today's Date: 11/09/2019 SLP Individual Time: 1330-1400 SLP Individual Time Calculation (min): 30 min  Short Term Goals: Week 1: SLP Short Term Goal 1 (Week 1): Pt will consume current diet, demonstrating efficient mastication and oral clearance with Mod A cues for ues of compensatory swallow strategies. SLP Short Term Goal 2 (Week 1): Pt will consume trials of upgraded dysphagia 3 solids with efficient mastication and oral clearance X3 prior to advancement. SLP Short Term Goal 3 (Week 1): Pt will use strategies to increase intelligibility to 60% at the phrase level with Max A cues. SLP Short Term Goal 4 (Week 1): Pt will demonstrate ability to recall new and/or daily information with Max A for use of compensatory strategies and/or aids. SLP Short Term Goal 5 (Week 1): Pt will demonstrate ability to problem solve functional basic situaiton with Mod A verbal/visual cues. SLP Short Term Goal 6 (Week 1): Pt will identify acute impairments (1 physical and 1 cognitive impairment) impacting his daily functioning with Max A cues.  Skilled Therapeutic Interventions: Skilled treatment session focused on dysphagia and speech goals. SLP facilitated session by providing skilled observation with a snack of upgraded textures. Patient consumed Dys. 3 textures with thin liquids with mildly prolonged but efficient mastication without overt s/s of aspiration. Patient's family present and also reported they have been brining him biscuits and sandwiches from home without overt coughing noted. Recommend patient upgrade to Dys. 3 textures (baseline diet) and remain on full supervision. Patient's family also reported that the patient's speech is close to or at baseline and may even sound more intelligible at times. However, SLP provided Min verbal cues for use of a slow rate at the sentence level to  achieve ~75% intelligibility. Patient left upright in recliner with alarm on and family present. Continue with current plan of care.      Pain No/Denies Pain   Therapy/Group: Individual Therapy  Enrique Manganaro 11/09/2019, 3:08 PM

## 2019-11-09 NOTE — Progress Notes (Signed)
[facility] PHYSICAL MEDICINE & REHABILITATION PROGRESS NOTE   Subjective/Complaints: Jeffery Reilly has no complaints this morning.  Pressures low.   ROS: Limited due to speech, intelligibility.   Objective:   No results found. No results for input(s): WBC, HGB, HCT, PLT in the last 72 hours. No results for input(s): NA, K, CL, CO2, GLUCOSE, BUN, CREATININE, CALCIUM in the last 72 hours.  Intake/Output Summary (Last 24 hours) at 11/09/2019 0908 Last data filed at 11/09/2019 0518 Gross per 24 hour  Intake 600 ml  Output 1100 ml  Net -500 ml     Physical Exam: Vital Signs Blood pressure 100/60, pulse 66, temperature (!) 97.3 F (36.3 C), temperature source Oral, resp. rate 17, height 5\' 10"  (1.778 m), weight 90.7 kg, SpO2 100 %.  Physical Exam  Constitutional: No distress . Vital signs reviewed. Sitting up in chair comfortably.  HEENT: EOMI, oral membranes moist Neck: supple Cardiovascular: RRR without murmur. No JVD    Respiratory/Chest: CTA Bilaterally without wheezes or rales. Normal effort    GI/Abdomen: BS +, non-tender, non-distended Ext: no clubbing, cyanosis, or edema Psych: pleasant but a little anxious Musc: no swelling or pain Neurological: He is alert and oriented x 3. Very dysarthric.  Sensation intact to light touch x 4 extremities. Remains very Apraxia oral, verbal and limb apraxia ongoing RUE 4/5 with PD. RLE 3+ to 4/5. LUE and LLE 5/5. Decreased LT RUE and RLE.  Skin: Skin is warm and dry. chronic venous changes   Assessment/Plan: 1. Functional deficits secondary to R MCA stroke which require 3+ hours per day of interdisciplinary therapy in a comprehensive inpatient rehab setting.  Physiatrist is providing close team supervision and 24 hour management of active medical problems listed below.  Physiatrist and rehab team continue to assess barriers to discharge/monitor patient progress toward functional and medical goals  Care Tool:  Bathing    Body  parts bathed by patient: Right arm, Left arm, Chest, Abdomen, Front perineal area, Right upper leg, Left upper leg, Face, Buttocks, Right lower leg   Body parts bathed by helper: Left lower leg     Bathing assist Assist Level: Minimal Assistance - Patient > 75%     Upper Body Dressing/Undressing Upper body dressing   What is the patient wearing?: Pull over shirt    Upper body assist Assist Level: Contact Guard/Touching assist    Lower Body Dressing/Undressing Lower body dressing      What is the patient wearing?: Incontinence brief, Pants     Lower body assist Assist for lower body dressing: Minimal Assistance - Patient > 75%     Toileting Toileting    Toileting assist Assist for toileting: Maximal Assistance - Patient 25 - 49%     Transfers Chair/bed transfer  Transfers assist     Chair/bed transfer assist level: Minimal Assistance - Patient > 75%     Locomotion Ambulation   Ambulation assist      Assist level: Contact Guard/Touching assist Assistive device: Walker-rolling Max distance: 150'   Walk 10 feet activity   Assist     Assist level: Contact Guard/Touching assist Assistive device: Walker-rolling   Walk 50 feet activity   Assist    Assist level: Contact Guard/Touching assist Assistive device: Walker-rolling    Walk 150 feet activity   Assist    Assist level: Contact Guard/Touching assist Assistive device: Walker-rolling    Walk 10 feet on uneven surface  activity   Assist Walk 10 feet on uneven surfaces activity  did not occur: Safety/medical concerns(decreased balance, motor planning, and R foot clearance)         Wheelchair     Assist   Type of Wheelchair: Manual    Wheelchair assist level: Supervision/Verbal cueing, Moderate Assistance - Patient 50 - 74% Max wheelchair distance: 150'    Wheelchair 50 feet with 2 turns activity    Assist        Assist Level: Supervision/Verbal cueing   Wheelchair  150 feet activity     Assist      Assist Level: Moderate Assistance - Patient 50 - 74%   Blood pressure 100/60, pulse 66, temperature (!) 97.3 F (36.3 C), temperature source Oral, resp. rate 17, height 5\' 10"  (1.778 m), weight 90.7 kg, SpO2 100 %.  Medical Problem List and Plan: 1.  ?New left-sided weakness with dysarthria secondary to right MCA scattered and single punctate right cerebellar infarct with right M1 occlusion status post right M1 thrombectomy.   -Pt has history of L MCA stroke with chronic right-sided residual weakness             -patient may  shower             -ELOS/Goals: 2.5-3 weeks- Supervision  -Continue CIR 2.  Antithrombotics: -DVT/anticoagulation: continue with sq heparin.  -recent dopplers on 5/20 were clear             -antiplatelet therapy: Aspirin 81 mg daily and Plavix 75 mg daily 3. Pain Management: Tylenol as needed. Well controlled  4. Mood: Provide emotional support, has had period of anxiety.              -antipsychotic agents: N/A 5. Neuropsych: This patient is capable of making decisions on his own behalf. 6. Skin/Wound Care: Routine skin checks  -eucerin cream/vaseline for dry skin 7. Fluids/Electrolytes/Nutrition: encourage appropriate PO 8.  Dysphagia.  Follow-up speech therapy.  Presently on dysphagia #2 thin liquid diet.  -tolerating diet well, needs to take his time 9.  Hypertension.  Lisinopril 40 mg daily decreased to 20mg  on 5/23  -5/27 bp's well controlled, continue same regimen  -5/28: BP is low. Flowsheet reviewed. Decrease Lisinopril to 10mg .  10.  Seizure prophylaxis.  Keppra 500 mg twice daily.  EEG negative 11.  Hyperlipidemia.  Lipitor 12.  Morbid obesity?   BMI only 28.7 13.  New findings diabetes mellitus.  Hemoglobin A1c 7.4.  SSI.   CBGs have been moderately well controlled at present --no changes  -CM diet 14.  Tobacco abuse.  Counseling 15.  Medical noncompliance.  Counseling 16. Constipation   - senokot 2 tabs  daily and has sorbitol prn.   -moving bowels currently      LOS: 6 days A FACE TO FACE EVALUATION WAS PERFORMED  Jeffery Reilly 11/09/2019, 9:08 AM

## 2019-11-09 NOTE — Progress Notes (Signed)
Physical Therapy Session Note  Patient Details  Name: Jeffery Reilly MRN: [id number] Date of Birth: September 25, 1945  Today's Date: 11/09/2019 PT Individual Time: 6789-3810 AND 1100-1155 PT Individual Time Calculation (min): 25 min AND 55  Short Term Goals: Week 1:  PT Short Term Goal 1 (Week 1): Patient will perform basic transfers with CGA. PT Short Term Goal 2 (Week 1): Patient will ambulate >150 ft with CGA. PT Short Term Goal 3 (Week 1): Patient will perform dynamic balance activities >3 min with CGA. PT Short Term Goal 4 (Week 1): Patient will perform 4-6" steps with min A.  Skilled Therapeutic Interventions/Progress Updates:   Session 1:  Pt in recliner and agreeable to therapy, no c/o pain. Ambulated to/from ADL apartment w/ CGA using RW, no shoes or R foot-up brace to bring pt's attention to R foot drag. Max verbal and visual cues to increase step length and foot clearance on R. Sit<>stands from ADL couch w/ mod assist. Discussed surfaces he primarily sits on at home including lift chair and outdoor chair. He reports he does sit on his couch occasionally but it's not as low to the ground as the ADL apartment one is. Discussed avoiding lower surfaces 2/2 BLE weakness. Pt verbalized understanding and in agreement. Returned to room and ended session in recliner, all needs in reach.   Session 2:  Pt in recliner and agreeable to therapy, no c/o pain. Donned shoes, socks, and R foot-up brace w/ total assist for time management. Ambulated to/from therapy gym w/ RW and CGA. Max verbal and visual cues to increased R foot clearance, step length, and for R heel-first contact. Worked on R DF activation and pre-gait tasks in therapy gym w/ stepping over visual cue on floor for step length and then cone taps for foot clearance. Performed multiple bouts of 10 reps in standing w/ CGA-close supervision. 50+ reps in total. Verbal cues for increased R DF. Ambulated back to room w/ same cues as detailed above,  small improvements in R foot clearance. Ended session in recliner, all needs in reach. Wife present, updated her on his progress and educated her on use of R foot-up brace for increased safety w/ gait.  Therapy Documentation Precautions:  Precautions Precautions: Fall Precaution Comments: Mild Rt hemi Restrictions Weight Bearing Restrictions: No  Therapy/Group: Individual Therapy  Natale Barba Melton Krebs 11/09/2019, 9:29 AM

## 2019-11-10 ENCOUNTER — Inpatient Hospital Stay (HOSPITAL_COMMUNITY): Payer: Medicare Other | Admitting: Physical Therapy

## 2019-11-10 MED ORDER — LEVETIRACETAM 100 MG/ML PO SOLN
500.0000 mg | Freq: Two times a day (BID) | ORAL | Status: DC
  Administered 2019-11-10 – 2019-11-14 (×9): 500 mg via ORAL
  Filled 2019-11-10 (×10): qty 5

## 2019-11-10 NOTE — Plan of Care (Signed)
  Problem: Consults Goal: RH STROKE PATIENT EDUCATION Description: See Patient Education module for education specifics moderate assistance Outcome: Progressing Goal: Nutrition Consult-if indicated Outcome: Progressing Goal: Diabetes Guidelines if Diabetic/Glucose > 140 Description: If diabetic or lab glucose is > 140 mg/dl - Initiate Diabetes/Hyperglycemia Guidelines & Document Interventions  Outcome: Progressing   Problem: RH BLADDER ELIMINATION Goal: RH STG MANAGE BLADDER WITH ASSISTANCE Description: STG Manage Bladder With moderate Assistance Outcome: Progressing Goal: RH STG MANAGE BLADDER WITH MEDICATION WITH ASSISTANCE Description: STG Manage Bladder With Medication With moderateAssistance. Outcome: Progressing   Problem: RH SKIN INTEGRITY Goal: RH STG SKIN FREE OF INFECTION/BREAKDOWN Outcome: Progressing Goal: RH STG MAINTAIN SKIN INTEGRITY WITH ASSISTANCE Description: STG Maintain Skin Integrity With moderate Assistance. Outcome: Progressing   Problem: RH SAFETY Goal: RH STG ADHERE TO SAFETY PRECAUTIONS W/ASSISTANCE/DEVICE Description: STG Adhere to Safety Precautions With moderate Assistance/Device. Outcome: Progressing Goal: RH STG DECREASED RISK OF FALL WITH ASSISTANCE Description: STG Decreased Risk of Fall With moderate Assistance. Outcome: Progressing   Problem: RH COGNITION-NURSING Goal: RH STG USES MEMORY AIDS/STRATEGIES W/ASSIST TO PROBLEM SOLVE Description: STG Uses Memory Aids/Strategies With moderate Assistance to Problem Solve. Outcome: Progressing Goal: RH STG ANTICIPATES NEEDS/CALLS FOR ASSIST W/ASSIST/CUES Description: STG Anticipates Needs/Calls for Assist With Assistance/Cues. Outcome: Progressing   Problem: RH PAIN MANAGEMENT Goal: RH STG PAIN MANAGED AT OR BELOW PT'S PAIN GOAL Description: Pain less than 2 Outcome: Progressing   Problem: RH KNOWLEDGE DEFICIT Goal: RH STG INCREASE KNOWLEDGE OF DIABETES Description: Moderate  assistance Outcome: Progressing Goal: RH STG INCREASE KNOWLEDGE OF HYPERTENSION Description: Moderate assistance Outcome: Progressing Goal: RH STG INCREASE KNOWLEDGE OF DYSPHAGIA/FLUID INTAKE Description: Moderate assistance Outcome: Progressing Goal: RH STG INCREASE KNOWLEGDE OF HYPERLIPIDEMIA Description: Moderate assistance Outcome: Progressing Goal: RH STG INCREASE KNOWLEDGE OF STROKE PROPHYLAXIS Description: Moderate assistance Outcome: Progressing

## 2019-11-10 NOTE — Progress Notes (Signed)
Physical Therapy Session Note  Patient Details  Name: Jeffery Reilly MRN: [id number] Date of Birth: [birth year]  Today's Date: 11/10/2019 PT Individual Time: 8250-0370 AND 1405-1430 PT Individual Time Calculation (min): 70 min AND 25 min  Short Term Goals: Week 1:  PT Short Term Goal 1 (Week 1): Patient will perform basic transfers with CGA. PT Short Term Goal 2 (Week 1): Patient will ambulate >150 ft with CGA. PT Short Term Goal 3 (Week 1): Patient will perform dynamic balance activities >3 min with CGA. PT Short Term Goal 4 (Week 1): Patient will perform 4-6" steps with min A.  Skilled Therapeutic Interventions/Progress Updates:   Session 1:  Pt received in supine and agreeable to therapy, denies pain. Supine>sit w/ supervision and increased time, verbal cues to attend to task as pt very internally distracted this session. Donned pants w/ supervision and shoes w/ total assist. Ambulated to chair in front of sink w/ CGA using RW. Set-up to shave at sink from seated level w/ supervision. Verbal cues for precautions w/ razor and for thoroughness. Pt performed 75% of shaving himself, therapist assisting for other 25% for time management. Pt w/ 2-3 small cuts, all of which stopped bleeding w/o compression, pt denied discomfort/pain. Pt changed to new shirt w/ supervision and increased time, verbal cues for technique. Ambulated to/from ADL apartment w/ CGA-close supervision using RW, verbal and visual cues for increased heel strike, good carryover of RLE management from yesterday's session, pt w/ longer R step length. Discussed home bathroom set-up and if he has a tub bench. Pt states he has a shower chair and that he has to step over tub. Discussed tub bench option for safety and pt is agreeable to this, made primary OT aware. Returned to room. Ended session in recliner, all needs in reach.   Session 2:  Pt in supine and agreeable to therapy, no c/o pain. Supine>sit w/ supervision and increased time.  Attempted to have pt don socks w/o assist from therapist, unable to life foot off ground while pulling sock on at the same time despite verbal and tactile cues for technique. Pt also unable to cross 1 leg over the other to better reach foot. Therapist performed total assist for time management. Sit>stand w/o AD w/ CGA. Ambulated into hallway and back x200' w/ CGA. Much slower speed than w/ RW, but no more assistance needed. Pt very cautious, he states this is his baseline walking speed and gait pattern w/o AD. Returned to room and ended session in recliner, all needs in reach.   Therapy Documentation Precautions:  Precautions Precautions: Fall Precaution Comments: Mild Rt hemi Restrictions Weight Bearing Restrictions: No Vital Signs: Therapy Vitals BP: 103/65  Therapy/Group: Individual Therapy  Shalawn Wynder Melton Krebs 11/10/2019, 9:21 AM

## 2019-11-10 NOTE — Progress Notes (Signed)
Physical Therapy Weekly Progress Note  Patient Details  Name: Jeffery Reilly MRN: [id number] Date of Birth: 1945-12-28  Beginning of progress report period: Nov 04, 2019 End of progress report period: Nov 10, 2019  Patient has met 4 of 4 short term goals. Pt has made excellent progress towards LTGs. He is performing all OOB mobility w/ CGA including gait over 150' w/ RW and stair negotiation x4 steps. He benefits from R foot-up brace to assist w/ chronic DF weakness that he believes was going on prior to his most recent stroke.   Patient continues to demonstrate the following deficits muscle weakness, decreased cardiorespiratoy endurance, unbalanced muscle activation, decreased coordination and decreased motor planning, decreased attention, decreased awareness, decreased problem solving, decreased safety awareness and decreased memory and decreased standing balance, decreased postural control, hemiplegia and decreased balance strategies and therefore will continue to benefit from skilled PT intervention to increase functional independence with mobility.  Patient progressing toward long term goals..  Continue plan of care. Pt is very motivated during therapy sessions and has good support from wife and children at home.   PT Short Term Goals Week 1:  PT Short Term Goal 1 (Week 1): Patient will perform basic transfers with CGA. PT Short Term Goal 1 - Progress (Week 1): Met PT Short Term Goal 2 (Week 1): Patient will ambulate >150 ft with CGA. PT Short Term Goal 2 - Progress (Week 1): Met PT Short Term Goal 3 (Week 1): Patient will perform dynamic balance activities >3 min with CGA. PT Short Term Goal 3 - Progress (Week 1): Met PT Short Term Goal 4 (Week 1): Patient will perform 4-6" steps with min A. PT Short Term Goal 4 - Progress (Week 1): Met Week 2:  PT Short Term Goal 1 (Week 2): =LTGs due to ELOS  Jamilett Ferrante K Eathan Groman 11/10/2019, 12:33 PM

## 2019-11-10 NOTE — Progress Notes (Signed)
[place] [facility] PHYSICAL MEDICINE & REHABILITATION PROGRESS NOTE   Subjective/Complaints: Participates well with PT, denies dizziness  ROS: Denies CP, SOB, N/V/D  Objective:   No results found. No results for input(s): WBC, HGB, HCT, PLT in the last 72 hours. No results for input(s): NA, K, CL, CO2, GLUCOSE, BUN, CREATININE, CALCIUM in the last 72 hours.  Intake/Output Summary (Last 24 hours) at 11/10/2019 0900 Last data filed at 11/10/2019 0640 Gross per 24 hour  Intake 582 ml  Output 300 ml  Net 282 ml     Physical Exam: Vital Signs Blood pressure 103/65, pulse 71, temperature 98.5 F (36.9 C), resp. rate 19, height 5\' 10"  (1.778 m), weight 90.7 kg, SpO2 97 %.  Physical Exam  Constitutional: No distress . Vital signs reviewed. Sitting up in chair comfortably.  HEENT: EOMI, oral membranes moist Neck: supple Cardiovascular: RRR without murmur. No JVD    Respiratory/Chest: CTA Bilaterally without wheezes or rales. Normal effort    GI/Abdomen: BS +, non-tender, non-distended Ext: no clubbing, cyanosis, or edema Psych: pleasant but a little anxious Musc: no swelling or pain Neurological: He is alert and oriented x 3. Very dysarthric.  Sensation intact to light touch x 4 extremities. Remains very Apraxia oral, verbal and limb apraxia ongoing RUE 4/5 . RLE 3+ to 4/5. LUE and LLE 5/5. Decreased LT RUE and RLE.  Skin: Skin is warm and dry. chronic venous changes   Assessment/Plan: 1. Functional deficits secondary to R MCA stroke which require 3+ hours per day of interdisciplinary therapy in a comprehensive inpatient rehab setting.  Physiatrist is providing close team supervision and 24 hour management of active medical problems listed below.  Physiatrist and rehab team continue to assess barriers to discharge/monitor patient progress toward functional and medical goals  Care Tool:  Bathing    Body parts bathed by patient: Right arm, Left arm, Chest, Abdomen, Front perineal  area, Right upper leg, Left upper leg, Face, Buttocks, Right lower leg   Body parts bathed by helper: Left lower leg     Bathing assist Assist Level: Minimal Assistance - Patient > 75%     Upper Body Dressing/Undressing Upper body dressing   What is the patient wearing?: Pull over shirt    Upper body assist Assist Level: Contact Guard/Touching assist    Lower Body Dressing/Undressing Lower body dressing      What is the patient wearing?: Incontinence brief, Pants     Lower body assist Assist for lower body dressing: Minimal Assistance - Patient > 75%     Toileting Toileting    Toileting assist Assist for toileting: Maximal Assistance - Patient 25 - 49%     Transfers Chair/bed transfer  Transfers assist     Chair/bed transfer assist level: Minimal Assistance - Patient > 75%     Locomotion Ambulation   Ambulation assist      Assist level: Contact Guard/Touching assist Assistive device: Walker-rolling Max distance: 150'   Walk 10 feet activity   Assist     Assist level: Contact Guard/Touching assist Assistive device: Walker-rolling   Walk 50 feet activity   Assist    Assist level: Contact Guard/Touching assist Assistive device: Walker-rolling    Walk 150 feet activity   Assist    Assist level: Contact Guard/Touching assist Assistive device: Walker-rolling    Walk 10 feet on uneven surface  activity   Assist Walk 10 feet on uneven surfaces activity did not occur: Safety/medical concerns(decreased balance, motor planning, and R foot clearance)  Wheelchair     Assist   Type of Wheelchair: Chief of Staff assist level: Supervision/Verbal cueing, Moderate Assistance - Patient 50 - 74% Max wheelchair distance: 150'    Wheelchair 50 feet with 2 turns activity    Assist        Assist Level: Supervision/Verbal cueing   Wheelchair 150 feet activity     Assist      Assist Level: Moderate Assistance  - Patient 50 - 74%   Blood pressure 103/65, pulse 71, temperature 98.5 F (36.9 C), resp. rate 19, height 5\' 10"  (1.778 m), weight 90.7 kg, SpO2 97 %.  Medical Problem List and Plan: 1.   right MCA scattered and single punctate right cerebellar infarct with right M1 occlusion status post right M1 thrombectomy.   -Pt has history of L subcorical and pontine  with chronic right-sided residual weakness             -patient may  shower             -ELOS/Goals: 2.5-3 weeks- Supervision  -Continue CIR PT, OT, SLP  2.  Antithrombotics: -DVT/anticoagulation: continue with sq heparin.  -recent dopplers on 5/20 were clear             -antiplatelet therapy: Aspirin 81 mg daily and Plavix 75 mg daily 3. Pain Management: Tylenol as needed. Well controlled  4. Mood: Provide emotional support, has had period of anxiety.              -antipsychotic agents: N/A 5. Neuropsych: This patient is capable of making decisions on his own behalf. 6. Skin/Wound Care: Routine skin checks  -eucerin cream/vaseline for dry skin 7. Fluids/Electrolytes/Nutrition: encourage appropriate PO 8.  Dysphagia.  Follow-up speech therapy.  Presently on dysphagia #2 thin liquid diet.  -tolerating diet well, needs to take his time 9.  Hypertension.  Lisinopril 40 mg daily decreased to 20mg  on 5/23  -5/27 bp's well controlled, continue same regimen  -5/28: BP is low. Flowsheet reviewed. Decrease Lisinopril to 10mg .  10.  Seizure prophylaxis.  Keppra 500 mg twice daily.  EEG negative 11.  Hyperlipidemia.  Lipitor 12.  Morbid obesity?   BMI only 28.7 13.  New findings diabetes mellitus.  Hemoglobin A1c 7.4.  SSI.   CBGs have been moderately well controlled at present --no changes  -CM diet 14.  Tobacco abuse.  Counseling 15.  Medical noncompliance.  Counseling 16. Constipation   - senokot 2 tabs daily and has sorbitol prn.   -moving bowels currently      LOS: 7 days A FACE TO FACE EVALUATION WAS PERFORMED  05-15-1991 11/10/2019, 9:00 AM

## 2019-11-11 ENCOUNTER — Inpatient Hospital Stay (HOSPITAL_COMMUNITY): Payer: Medicare Other

## 2019-11-11 MED ORDER — AMMONIUM LACTATE 12 % EX LOTN
TOPICAL_LOTION | Freq: Two times a day (BID) | CUTANEOUS | Status: DC
  Administered 2019-11-13 – 2019-11-14 (×2): 1 via TOPICAL
  Filled 2019-11-11: qty 225

## 2019-11-11 NOTE — Plan of Care (Signed)
  Problem: Consults Goal: RH STROKE PATIENT EDUCATION Description: See Patient Education module for education specifics moderate assistance Outcome: Progressing Goal: Nutrition Consult-if indicated Outcome: Progressing Goal: Diabetes Guidelines if Diabetic/Glucose > 140 Description: If diabetic or lab glucose is > 140 mg/dl - Initiate Diabetes/Hyperglycemia Guidelines & Document Interventions  Outcome: Progressing   Problem: RH BOWEL ELIMINATION Goal: RH STG MANAGE BOWEL WITH ASSISTANCE Description: STG Manage Bowel with moderate Assistance. Outcome: Progressing Goal: RH STG MANAGE BOWEL W/MEDICATION W/ASSISTANCE Description: STG Manage Bowel with Medication with moderate Assistance. Outcome: Progressing   Problem: RH BLADDER ELIMINATION Goal: RH STG MANAGE BLADDER WITH ASSISTANCE Description: STG Manage Bladder With moderate Assistance Outcome: Progressing Goal: RH STG MANAGE BLADDER WITH MEDICATION WITH ASSISTANCE Description: STG Manage Bladder With Medication With moderateAssistance. Outcome: Progressing   Problem: RH SKIN INTEGRITY Goal: RH STG SKIN FREE OF INFECTION/BREAKDOWN Outcome: Progressing Goal: RH STG MAINTAIN SKIN INTEGRITY WITH ASSISTANCE Description: STG Maintain Skin Integrity With moderate Assistance. Outcome: Progressing   Problem: RH SAFETY Goal: RH STG ADHERE TO SAFETY PRECAUTIONS W/ASSISTANCE/DEVICE Description: STG Adhere to Safety Precautions With moderate Assistance/Device. Outcome: Progressing Goal: RH STG DECREASED RISK OF FALL WITH ASSISTANCE Description: STG Decreased Risk of Fall With moderate Assistance. Outcome: Progressing   Problem: RH COGNITION-NURSING Goal: RH STG USES MEMORY AIDS/STRATEGIES W/ASSIST TO PROBLEM SOLVE Description: STG Uses Memory Aids/Strategies With moderate Assistance to Problem Solve. Outcome: Progressing Goal: RH STG ANTICIPATES NEEDS/CALLS FOR ASSIST W/ASSIST/CUES Description: STG Anticipates Needs/Calls for  Assist With Assistance/Cues. Outcome: Progressing   Problem: RH PAIN MANAGEMENT Goal: RH STG PAIN MANAGED AT OR BELOW PT'S PAIN GOAL Description: Pain less than 2 Outcome: Progressing   Problem: RH KNOWLEDGE DEFICIT Goal: RH STG INCREASE KNOWLEDGE OF DIABETES Description: Moderate assistance Outcome: Progressing Goal: RH STG INCREASE KNOWLEDGE OF HYPERTENSION Description: Moderate assistance Outcome: Progressing Goal: RH STG INCREASE KNOWLEDGE OF DYSPHAGIA/FLUID INTAKE Description: Moderate assistance Outcome: Progressing Goal: RH STG INCREASE KNOWLEGDE OF HYPERLIPIDEMIA Description: Moderate assistance Outcome: Progressing Goal: RH STG INCREASE KNOWLEDGE OF STROKE PROPHYLAXIS Description: Moderate assistance Outcome: Progressing

## 2019-11-11 NOTE — Plan of Care (Signed)
  Problem: Consults Goal: RH STROKE PATIENT EDUCATION Description: See Patient Education module for education specifics moderate assistance Outcome: Progressing Goal: Nutrition Consult-if indicated Outcome: Progressing Goal: Diabetes Guidelines if Diabetic/Glucose > 140 Description: If diabetic or lab glucose is > 140 mg/dl - Initiate Diabetes/Hyperglycemia Guidelines & Document Interventions  Outcome: Progressing   Problem: RH BOWEL ELIMINATION Goal: RH STG MANAGE BOWEL WITH ASSISTANCE Description: STG Manage Bowel with moderate Assistance. Outcome: Progressing Goal: RH STG MANAGE BOWEL W/MEDICATION W/ASSISTANCE Description: STG Manage Bowel with Medication with moderate Assistance. Outcome: Progressing   Problem: RH BLADDER ELIMINATION Goal: RH STG MANAGE BLADDER WITH ASSISTANCE Description: STG Manage Bladder With moderate Assistance Outcome: Progressing Goal: RH STG MANAGE BLADDER WITH MEDICATION WITH ASSISTANCE Description: STG Manage Bladder With Medication With moderateAssistance. Outcome: Progressing   Problem: RH SKIN INTEGRITY Goal: RH STG SKIN FREE OF INFECTION/BREAKDOWN Outcome: Progressing Goal: RH STG MAINTAIN SKIN INTEGRITY WITH ASSISTANCE Description: STG Maintain Skin Integrity With moderate Assistance. Outcome: Progressing   Problem: RH SAFETY Goal: RH STG ADHERE TO SAFETY PRECAUTIONS W/ASSISTANCE/DEVICE Description: STG Adhere to Safety Precautions With moderate Assistance/Device. Outcome: Progressing Goal: RH STG DECREASED RISK OF FALL WITH ASSISTANCE Description: STG Decreased Risk of Fall With moderate Assistance. Outcome: Progressing   Problem: RH COGNITION-NURSING Goal: RH STG USES MEMORY AIDS/STRATEGIES W/ASSIST TO PROBLEM SOLVE Description: STG Uses Memory Aids/Strategies With moderate Assistance to Problem Solve. Outcome: Progressing Goal: RH STG ANTICIPATES NEEDS/CALLS FOR ASSIST W/ASSIST/CUES Description: STG Anticipates Needs/Calls for  Assist With Assistance/Cues. Outcome: Progressing   Problem: RH PAIN MANAGEMENT Goal: RH STG PAIN MANAGED AT OR BELOW PT'S PAIN GOAL Description: Pain less than 2 Outcome: Progressing   Problem: RH KNOWLEDGE DEFICIT Goal: RH STG INCREASE KNOWLEDGE OF DIABETES Description: Moderate assistance Outcome: Progressing Goal: RH STG INCREASE KNOWLEDGE OF HYPERTENSION Description: Moderate assistance Outcome: Progressing Goal: RH STG INCREASE KNOWLEDGE OF DYSPHAGIA/FLUID INTAKE Description: Moderate assistance Outcome: Progressing Goal: RH STG INCREASE KNOWLEGDE OF HYPERLIPIDEMIA Description: Moderate assistance Outcome: Progressing Goal: RH STG INCREASE KNOWLEDGE OF STROKE PROPHYLAXIS Description: Moderate assistance Outcome: Progressing   

## 2019-11-11 NOTE — Progress Notes (Signed)
Occupational Therapy Session Note  Patient Details  Name: Jeffery Reilly MRN: [id number] Date of Birth: [birth year]  Today's Date: 11/11/2019  Session 1: OT Individual Time: 0830-0900 OT Individual Time Calculation (min): 30 min   Session 2: OT Individual Time: 1115-1200 OT Individual Time Calculation (min): 45 min    Short Term Goals: Week 1:  OT Short Term Goal 1 (Week 1): Pt will complete toilet transfers with Min A sit<stand OT Short Term Goal 2 (Week 1): Pt will complete shower transfer with Min A using LRAD OT Short Term Goal 3 (Week 1): Pt will complete 2 grooming tasks while standing at the sink to improve standing balance and standing endurance  Skilled Therapeutic Interventions/Progress Updates:    Session 1: Pt received sitting up in the recliner with no c/o pain. Pt agreeable to change clothing. Pt donned pants with min A to stand, increased time/effort to don pants with cueing required for hemi technique. Pt donned new shirt with (S). Pt required cueing for upright posture upon standing. Pt used RW to ambulate to sink with support fading to CGA and min cueing for RW management. Pt completed oral hygiene and grooming tasks in standing with close (S) and cueing for positioning. Pt demonstrated improvement in spontaneous R UE use with min cueing still provided. Pt used RW to complete 50 ft of functional mobility with cueing required for RW positioning/managment and UE placement. Pt was left sitting up in the recliner with all needs met, chair alarm set.   Session 2: Pt received sitting up in the recliner with no c/o pain. Pt used RW and completed functional mobility to there tub room with CGA. Mod cueing required for increasing pace, stride length and RW management. Pt completed simulated tub transfer using a TTB with min cueing for instruction. Pt able to complete with min A overall. In the therapy gym the remainder of the session focused on R UE NMR. Pt completed throwing/cathcing  activity to challenge BUE coordination with min facilitation required overall and poor reaction time. Pt then used 1 in coins to practice pincer and modified tripod grasp. Pt required frequent intervention to minimize L hand taking over and to isolate pincer grasp. Min facilitation overall required. Pt returned to his room and was left sitting up with all needs met, chair alarm set.   Therapy Documentation Precautions:  Precautions Precautions: Fall Precaution Comments: Mild Rt hemi Restrictions Weight Bearing Restrictions: No Therapy/Group: Individual Therapy  Curtis Sites 11/11/2019, 6:36 AM

## 2019-11-11 NOTE — Progress Notes (Signed)
[facility] PHYSICAL MEDICINE & REHABILITATION PROGRESS NOTE   Subjective/Complaints:   ROS: Denies CP, SOB, N/V/D  Objective:   No results found. No results for input(s): WBC, HGB, HCT, PLT in the last 72 hours. No results for input(s): NA, K, CL, CO2, GLUCOSE, BUN, CREATININE, CALCIUM in the last 72 hours.  Intake/Output Summary (Last 24 hours) at 11/11/2019 0623 Last data filed at 11/11/2019 0427 Gross per 24 hour  Intake 572 ml  Output 750 ml  Net -178 ml     Physical Exam: Vital Signs Blood pressure 98/86, pulse (!) 59, temperature 98.7 F (37.1 C), resp. rate 18, height 5\' 10"  (1.778 m), weight 90.7 kg, SpO2 100 %.  Physical Exam   General: No acute distress Mood and affect are appropriate Heart: Regular rate and rhythm no rubs murmurs or extra sounds Lungs: Clear to auscultation, breathing unlabored, no rales or wheezes Abdomen: Positive bowel sounds, soft nontender to palpation, nondistended Extremities: No clubbing, cyanosis, or edema Skin: No evidence of breakdown, no evidence of rash  Musc: no swelling or pain Neurological: He is alert and oriented x 3. Very dysarthric.  Sensation intact to light touch x 4 extremities. Remains very Apraxia oral, verbal and limb apraxia ongoing RUE 4/5 . RLE 3+ to 4/5. LUE and LLE 5/5. Decreased LT RUE and RLE.  Skin: Skin is warm and dry. chronic venous changes   Assessment/Plan: 1. Functional deficits secondary to R MCA stroke which require 3+ hours per day of interdisciplinary therapy in a comprehensive inpatient rehab setting.  Physiatrist is providing close team supervision and 24 hour management of active medical problems listed below.  Physiatrist and rehab team continue to assess barriers to discharge/monitor patient progress toward functional and medical goals  Care Tool:  Bathing    Body parts bathed by patient: Right arm, Left arm, Chest, Abdomen, Front perineal area, Right upper leg, Left upper leg, Face,  Buttocks, Right lower leg   Body parts bathed by helper: Left lower leg     Bathing assist Assist Level: Minimal Assistance - Patient > 75%     Upper Body Dressing/Undressing Upper body dressing   What is the patient wearing?: Pull over shirt    Upper body assist Assist Level: Contact Guard/Touching assist    Lower Body Dressing/Undressing Lower body dressing      What is the patient wearing?: Incontinence brief, Pants     Lower body assist Assist for lower body dressing: Minimal Assistance - Patient > 75%     Toileting Toileting    Toileting assist Assist for toileting: Maximal Assistance - Patient 25 - 49%     Transfers Chair/bed transfer  Transfers assist     Chair/bed transfer assist level: Minimal Assistance - Patient > 75%     Locomotion Ambulation   Ambulation assist      Assist level: Contact Guard/Touching assist Assistive device: Walker-rolling Max distance: 150'   Walk 10 feet activity   Assist     Assist level: Contact Guard/Touching assist Assistive device: Walker-rolling   Walk 50 feet activity   Assist    Assist level: Contact Guard/Touching assist Assistive device: Walker-rolling    Walk 150 feet activity   Assist    Assist level: Contact Guard/Touching assist Assistive device: Walker-rolling    Walk 10 feet on uneven surface  activity   Assist Walk 10 feet on uneven surfaces activity did not occur: Safety/medical concerns(decreased balance, motor planning, and R foot clearance)  Wheelchair     Assist   Type of Wheelchair: Agricultural engineer assist level: Supervision/Verbal cueing, Moderate Assistance - Patient 50 - 74% Max wheelchair distance: 150'    Wheelchair 50 feet with 2 turns activity    Assist        Assist Level: Supervision/Verbal cueing   Wheelchair 150 feet activity     Assist      Assist Level: Moderate Assistance - Patient 50 - 74%   Blood pressure 98/86,  pulse (!) 59, temperature 98.7 F (37.1 C), resp. rate 18, height 5\' 10"  (1.778 m), weight 90.7 kg, SpO2 100 %.  Medical Problem List and Plan: 1.   right MCA scattered and single punctate right cerebellar infarct with right M1 occlusion status post right M1 thrombectomy.   -Pt has history of L subcorical and pontine  with chronic right-sided residual weakness             -patient may  shower             -ELOS/Goals: 2.5-3 weeks- Supervision  -Continue CIR PT, OT, SLP  2.  Antithrombotics: -DVT/anticoagulation: continue with sq heparin.  -recent dopplers on 5/20 were clear             -antiplatelet therapy: Aspirin 81 mg daily and Plavix 75 mg daily 3. Pain Management: Tylenol as needed. Well controlled  4. Mood: Provide emotional support, has had period of anxiety.              -antipsychotic agents: N/A 5. Neuropsych: This patient is capable of making decisions on his own behalf. 6. Skin/Wound Care: Routine skin checks  -eucerin cream/vaseline for dry skin- switch to Amlactin to help exfoilate 7. Fluids/Electrolytes/Nutrition: encourage appropriate PO 8.  Dysphagia.  Follow-up speech therapy.  Presently on dysphagia #2 thin liquid diet.  -tolerating diet well, needs to take his time 9.  Hypertension.  Lisinopril 40 mg daily decreased to 20mg  on 5/23  -5/27 bp's well controlled, continue same regimen  -5/28: BP is low. Flowsheet reviewed. Decrease Lisinopril to 10mg . Dose started 5/29 monitor  Vitals:   11/10/19 1951 11/11/19 0427  BP: 105/71 98/86  Pulse: 60 (!) 59  Resp: 16 18  Temp: 98.1 F (36.7 C) 98.7 F (37.1 C)  SpO2: 94% 100%   10.  Seizure prophylaxis.  Keppra 500 mg twice daily.  EEG negative 11.  Hyperlipidemia.  Lipitor 12.  Morbid obesity?   BMI only 28.7 13.  New findings diabetes mellitus.  Hemoglobin A1c 7.4.  SSI.   CBGs have been moderately well controlled at present --no changes  -CM diet 14.  Tobacco abuse.  Counseling 15.  Medical noncompliance.   Counseling 16. Constipation   - senokot 2 tabs daily and has sorbitol prn.   -moving bowels currently      LOS: 8 days A FACE TO Jacobus E Yulia Ulrich 11/11/2019, 6:23 AM

## 2019-11-12 ENCOUNTER — Inpatient Hospital Stay (HOSPITAL_COMMUNITY): Payer: Medicare Other

## 2019-11-12 ENCOUNTER — Inpatient Hospital Stay (HOSPITAL_COMMUNITY): Payer: Medicare Other | Admitting: Speech Pathology

## 2019-11-12 ENCOUNTER — Inpatient Hospital Stay (HOSPITAL_COMMUNITY): Payer: Medicare Other | Admitting: Physical Therapy

## 2019-11-12 NOTE — Progress Notes (Signed)
Physical Therapy Session Note  Patient Details  Name: Jeffery Reilly MRN: [id number] Date of Birth: [birth year]  Today's Date: 11/12/2019 PT Individual Time: 6834-1962 PT Individual Time Calculation (min): 55 min   Short Term Goals: Week 2:  PT Short Term Goal 1 (Week 2): =LTGs due to ELOS  Skilled Therapeutic Interventions/Progress Updates:   Pt in recliner and agreeable to therapy, no c/o pain. Sit>stand from recliner w/ supervision. Ambulated to/from therapy gym w/ close supervision-CGA using RW. No shoes or R foot-up brace to force pt's focus on R foot drag, verbal and visual cues for increased foot clearance and heel contact. Worked on dynamic gait w/o AD, ambulated through obstacle course w/ CGA. Course included weaving through cones and stepping over objects. Performed Berg Balance Scale as detailed below and explained significance of results to pt including increased fall risk w/ score <45/56. Worked on sit<>stands w/o UE push-up to work on balance strategies when stabilizing in stance, performed w/ min assist w/ verbal, manual, and tactile cues for anterior trunk lean and to push up w/ BLEs. Verbal and tactile cues to weight shift onto toes once in stance. Performed x11 reps in total, pt went from heavy min to light min assist towards end of blocked practice. Ambulated back to room w/ same assist and cues as detailed above. Ended session in recliner, all needs in reach.   Therapy Documentation Precautions:  Precautions Precautions: Fall Precaution Comments: Mild Rt hemi Restrictions Weight Bearing Restrictions: No Balance: Standardized Balance Assessment Standardized Balance Assessment: Berg Balance Test Berg Balance Test Sit to Stand: Able to stand  independently using hands Standing Unsupported: Able to stand 2 minutes with supervision Sitting with Back Unsupported but Feet Supported on Floor or Stool: Able to sit 2 minutes under supervision Stand to Sit: Controls descent by  using hands Transfers: Able to transfer safely, definite need of hands Standing Unsupported with Eyes Closed: Able to stand 10 seconds with supervision Standing Ubsupported with Feet Together: Able to place feet together independently but unable to hold for 30 seconds From Standing, Reach Forward with Outstretched Arm: Reaches forward but needs supervision From Standing Position, Pick up Object from Floor: Able to pick up shoe, needs supervision From Standing Position, Turn to Look Behind Over each Shoulder: Needs supervision when turning Turn 360 Degrees: Able to turn 360 degrees safely but slowly Standing Unsupported, Alternately Place Feet on Step/Stool: Able to complete >2 steps/needs minimal assist Standing Unsupported, One Foot in Front: Able to take small step independently and hold 30 seconds Standing on One Leg: Unable to try or needs assist to prevent fall Total Score: 30  Therapy/Group: Individual Therapy  Kathrene Sinopoli Melton [facility] 11/12/2019, 11:24 AM

## 2019-11-12 NOTE — Plan of Care (Signed)
  Problem: Consults Goal: RH STROKE PATIENT EDUCATION Description: See Patient Education module for education specifics moderate assistance Outcome: Progressing Goal: Nutrition Consult-if indicated Outcome: Progressing Goal: Diabetes Guidelines if Diabetic/Glucose > 140 Description: If diabetic or lab glucose is > 140 mg/dl - Initiate Diabetes/Hyperglycemia Guidelines & Document Interventions  Outcome: Progressing   Problem: RH BOWEL ELIMINATION Goal: RH STG MANAGE BOWEL WITH ASSISTANCE Description: STG Manage Bowel with moderate Assistance. Outcome: Progressing Goal: RH STG MANAGE BOWEL W/MEDICATION W/ASSISTANCE Description: STG Manage Bowel with Medication with moderate Assistance. Outcome: Progressing   Problem: RH BLADDER ELIMINATION Goal: RH STG MANAGE BLADDER WITH ASSISTANCE Description: STG Manage Bladder With moderate Assistance Outcome: Progressing Goal: RH STG MANAGE BLADDER WITH MEDICATION WITH ASSISTANCE Description: STG Manage Bladder With Medication With moderateAssistance. Outcome: Progressing   Problem: RH SKIN INTEGRITY Goal: RH STG SKIN FREE OF INFECTION/BREAKDOWN Outcome: Progressing Goal: RH STG MAINTAIN SKIN INTEGRITY WITH ASSISTANCE Description: STG Maintain Skin Integrity With moderate Assistance. Outcome: Progressing   Problem: RH SAFETY Goal: RH STG ADHERE TO SAFETY PRECAUTIONS W/ASSISTANCE/DEVICE Description: STG Adhere to Safety Precautions With moderate Assistance/Device. Outcome: Progressing Goal: RH STG DECREASED RISK OF FALL WITH ASSISTANCE Description: STG Decreased Risk of Fall With moderate Assistance. Outcome: Progressing   Problem: RH COGNITION-NURSING Goal: RH STG USES MEMORY AIDS/STRATEGIES W/ASSIST TO PROBLEM SOLVE Description: STG Uses Memory Aids/Strategies With moderate Assistance to Problem Solve. Outcome: Progressing Goal: RH STG ANTICIPATES NEEDS/CALLS FOR ASSIST W/ASSIST/CUES Description: STG Anticipates Needs/Calls for  Assist With Assistance/Cues. Outcome: Progressing   Problem: RH PAIN MANAGEMENT Goal: RH STG PAIN MANAGED AT OR BELOW PT'S PAIN GOAL Description: Pain less than 2 Outcome: Progressing   Problem: RH KNOWLEDGE DEFICIT Goal: RH STG INCREASE KNOWLEDGE OF DIABETES Description: Moderate assistance Outcome: Progressing Goal: RH STG INCREASE KNOWLEDGE OF HYPERTENSION Description: Moderate assistance Outcome: Progressing Goal: RH STG INCREASE KNOWLEDGE OF DYSPHAGIA/FLUID INTAKE Description: Moderate assistance Outcome: Progressing Goal: RH STG INCREASE KNOWLEGDE OF HYPERLIPIDEMIA Description: Moderate assistance Outcome: Progressing Goal: RH STG INCREASE KNOWLEDGE OF STROKE PROPHYLAXIS Description: Moderate assistance Outcome: Progressing   

## 2019-11-12 NOTE — Progress Notes (Signed)
Speech Language Pathology Weekly Progress and Session Note  Patient Details  Name: Jeffery Reilly MRN: [id number] Date of Birth: 23-Dec-1945  Beginning of progress report period: Nov 04, 2019 End of progress report period: Nov 12, 2019  Today's Date: 11/12/2019 SLP Individual Time: 0160-1093 SLP Individual Time Calculation (min): 28 min  Short Term Goals: Week 1: SLP Short Term Goal 1 (Week 1): Pt will consume current diet, demonstrating efficient mastication and oral clearance with Mod A cues for ues of compensatory swallow strategies. SLP Short Term Goal 1 - Progress (Week 1): Met SLP Short Term Goal 2 (Week 1): Pt will consume trials of upgraded dysphagia 3 solids with efficient mastication and oral clearance X3 prior to advancement. SLP Short Term Goal 2 - Progress (Week 1): Met SLP Short Term Goal 3 (Week 1): Pt will use strategies to increase intelligibility to 60% at the phrase level with Max A cues. SLP Short Term Goal 3 - Progress (Week 1): Met SLP Short Term Goal 4 (Week 1): Pt will demonstrate ability to recall new and/or daily information with Max A for use of compensatory strategies and/or aids. SLP Short Term Goal 4 - Progress (Week 1): Met SLP Short Term Goal 5 (Week 1): Pt will demonstrate ability to problem solve functional basic situaiton with Mod A verbal/visual cues. SLP Short Term Goal 5 - Progress (Week 1): Met SLP Short Term Goal 6 (Week 1): Pt will identify acute impairments (1 physical and 1 cognitive impairment) impacting his daily functioning with Max A cues. SLP Short Term Goal 6 - Progress (Week 1): Met    New Short Term Goals: Week 2: SLP Short Term Goal 1 (Week 2): STG=LTG due to remaining LOS  Weekly Progress Updates: Pt has made steady functional gains and met 6 out of 6 short term goals this reporting period. Pt is currently Mod assist for basic tasks due to cognitive impairments impacting his short term memory, basic problem solving, awareness and  attention. Pt is consuming an upgraded dysphagia 3 (mech soft) diet with thin liquids with overall Min A cues for use of safe swallow strategies; he does require full supervision during meals. Pt's speech intelligibility is close to baseline level of ineligibility and he is using strategies to increase intelligibility in functional communication exchanges with Min A cues. Pt and family education is ongoing. Pt would continue to benefit from skilled ST while inpatient in order to maximize functional independence and reduce burden of care prior to discharge. Anticipate that pt will need 24/7 supervision at discharge in addition to [place] follow up at next level of care.       Intensity: Minumum of 1-2 x/day, 30 to 90 minutes Frequency: 3 to 5 out of 7 days Duration/Length of Stay: 11/16/19 Treatment/Interventions: Cognitive remediation/compensation;Cueing hierarchy;Dysphagia/aspiration precaution training;Functional tasks;Patient/family education;Internal/external aids;Speech/Language facilitation   Daily Session  Skilled Therapeutic Interventions: Pt was seen for skilled ST targeting cognition. SLP facilitated session with opportunities for recall of functional information. Pt required overall Mod A verbal cues to identify target goals in ST sessions. He recalled 1 speech intelligibility strategy with Supervision A. Max A verbal cues and use of external aid required for recall of current medication functions, and for problem solving to demonstrate how to organize a 2X daily pill in BID pill box. A basic 3-step action card sequencing task was also introduced, during which pt required Max faded to Mod A verbal and visual cues for problem solving and error awareness. Pt left sitting in recliner with  alarm set and needs within reach. Continue per current plan of care.           Pain Pain Assessment Pain Scale: 0-10 Pain Score: 0-No pain  Therapy/Group: Individual Therapy  Arbutus Leas 11/12/2019, 7:00  AM

## 2019-11-12 NOTE — Progress Notes (Signed)
Occupational Therapy Session Note  Patient Details  Name: [name] MRN: [id number] Date of Birth: Mar 24, 1946  Today's Date: 11/12/2019 OT Individual Time: 5520-8022 OT Individual Time Calculation (min): 70 min   Session 2: OT Individual Time: 1300-1400 OT Individual Time Calculation (min): 60 min    Short Term Goals: Week 1:  OT Short Term Goal 1 (Week 1): Pt will complete toilet transfers with Min A sit<stand OT Short Term Goal 2 (Week 1): Pt will complete shower transfer with Min A using LRAD OT Short Term Goal 3 (Week 1): Pt will complete 2 grooming tasks while standing at the sink to improve standing balance and standing endurance  Skilled Therapeutic Interventions/Progress Updates:    Pt received sitting up in the recliner eating breakfast. No c/o pain. Pt passed off from NT supervising breakfast. Min cueing provided for bite size management but overall pt eating with good management of SLP implemented strategies. Pt agreeable to take shower. Pt required min A for power up from recliner. CGA for functional mobility into bathroom once up on feet. TTB turned out so pt could reach feet more easily. Pt able to complete all bathing seated with close (S). Min A to stand to wash buttocks in standing with cueing for use of R hand more frequently. Pt also requires cueing for full grasp of R UE, often using just thumb and 3rd finger to grasp. Extra time spent on foot care, min A to ensure thoroughness in drying and applying lotion. Pt returned to the recliner and took medicine from RN. Pt donned shirt with (S). Pt donned pants with CGA- great improvement! Pt was left sitting up in the recliner with all needs met.   Session 2:  Pt received sitting up in the recliner with no c/o pain. Pt finished eating lunch with good carryover of cueing from breakfast. Pt agreeable to session. Pt stood from recliner with CGA and used RW to complete 100 ft of functional mobility with CGA. In the ADL apt pt  completed reaching activity with functional household items using the RUE for NMR. Pt requires frequent cueing for spontaneous RUE use, with LUE taking over task frequently. Occasionally min facilitation required for 90 degree shoulder flexion. Pt took seated rest break between activities. Pt completed water pouring activity in standing to challenge RUE motor planning and grading of strength demands when filling up measuring cup and stabilizing when pouring out. By end of activity pt demonstrated great improvement in RUE functional grasp and in supination. Min cueing for RW management in the kitchen. Pt returned to his room and was left sitting up with all needs met.    Therapy Documentation Precautions:  Precautions Precautions: Fall Precaution Comments: Mild Rt hemi Restrictions Weight Bearing Restrictions: No   Therapy/Group: Individual Therapy  Curtis Sites 11/12/2019, 6:42 AM

## 2019-11-12 NOTE — Progress Notes (Signed)
[facility] PHYSICAL MEDICINE & REHABILITATION PROGRESS NOTE   Subjective/Complaints: Up in chair with OT. Just took morning meds. No new complaints  ROS: Patient denies fever, rash, sore throat, blurred vision, nausea, vomiting, diarrhea, cough, shortness of breath or chest pain, joint or back pain, headache, or mood change.   Objective:   No results found. No results for input(s): WBC, HGB, HCT, PLT in the last 72 hours. No results for input(s): NA, K, CL, CO2, GLUCOSE, BUN, CREATININE, CALCIUM in the last 72 hours.  Intake/Output Summary (Last 24 hours) at 11/12/2019 0924 Last data filed at 11/12/2019 0506 Gross per 24 hour  Intake 375 ml  Output 300 ml  Net 75 ml     Physical Exam: Vital Signs Blood pressure 104/60, pulse 60, temperature 97.8 F (36.6 C), temperature source Oral, resp. rate 16, height 5\' 10"  (1.778 m), weight 90.7 kg, SpO2 100 %.  Physical Exam   Constitutional: No distress . Vital signs reviewed. HEENT: EOMI, oral membranes moist Neck: supple Cardiovascular: RRR without murmur. No JVD    Respiratory/Chest: CTA Bilaterally without wheezes or rales. Normal effort    GI/Abdomen: BS +, non-tender, non-distended Ext: no clubbing, cyanosis, or edema Psych: pleasant and cooperative Musc: no swelling or pain Neurological: He is alert and oriented x 3. Very dysarthric.  Sensation intact to light touch x 4 extremities. Ongoing apraxia. RUE 4/5 . RLE 3+ to 4/5. LUE and LLE 5/5. Decreased LT RUE and RLE.  Skin: Skin is warm and dry. chronic venous changes BLE   Assessment/Plan: 1. Functional deficits secondary to R MCA stroke which require 3+ hours per day of interdisciplinary therapy in a comprehensive inpatient rehab setting.  Physiatrist is providing close team supervision and 24 hour management of active medical problems listed below.  Physiatrist and rehab team continue to assess barriers to discharge/monitor patient progress toward functional and medical  goals  Care Tool:  Bathing    Body parts bathed by patient: Right arm, Left arm, Chest, Abdomen, Front perineal area, Right upper leg, Left upper leg, Face, Buttocks, Right lower leg, Left lower leg   Body parts bathed by helper: Left lower leg     Bathing assist Assist Level: Minimal Assistance - Patient > 75%     Upper Body Dressing/Undressing Upper body dressing   What is the patient wearing?: Pull over shirt    Upper body assist Assist Level: Supervision/Verbal cueing    Lower Body Dressing/Undressing Lower body dressing      What is the patient wearing?: Incontinence brief, Pants     Lower body assist Assist for lower body dressing: Minimal Assistance - Patient > 75%     Toileting Toileting    Toileting assist Assist for toileting: Maximal Assistance - Patient 25 - 49%     Transfers Chair/bed transfer  Transfers assist     Chair/bed transfer assist level: Contact Guard/Touching assist     Locomotion Ambulation   Ambulation assist      Assist level: Contact Guard/Touching assist Assistive device: Walker-rolling Max distance: 150'   Walk 10 feet activity   Assist     Assist level: Contact Guard/Touching assist Assistive device: Walker-rolling   Walk 50 feet activity   Assist    Assist level: Contact Guard/Touching assist Assistive device: Walker-rolling    Walk 150 feet activity   Assist    Assist level: Contact Guard/Touching assist Assistive device: Walker-rolling    Walk 10 feet on uneven surface  activity   Assist Walk  10 feet on uneven surfaces activity did not occur: Safety/medical concerns(decreased balance, motor planning, and R foot clearance)         Wheelchair     Assist   Type of Wheelchair: Manual    Wheelchair assist level: Supervision/Verbal cueing, Moderate Assistance - Patient 50 - 74% Max wheelchair distance: 150'    Wheelchair 50 feet with 2 turns activity    Assist        Assist  Level: Supervision/Verbal cueing   Wheelchair 150 feet activity     Assist      Assist Level: Moderate Assistance - Patient 50 - 74%   Blood pressure 104/60, pulse 60, temperature 97.8 F (36.6 C), temperature source Oral, resp. rate 16, height 5\' 10"  (1.778 m), weight 90.7 kg, SpO2 100 %.  Medical Problem List and Plan: 1.   right MCA scattered and single punctate right cerebellar infarct with right M1 occlusion status post right M1 thrombectomy.   -Pt has history of L subcorical and pontine  with chronic right-sided residual weakness             -patient may  shower             -ELOS/Goals: 2.5-3 weeks- Supervision  -Continue CIR PT, OT, SLP  2.  Antithrombotics: -DVT/anticoagulation: continue with sq heparin.  -recent dopplers on 5/20 were clear             -antiplatelet therapy: Aspirin 81 mg daily and Plavix 75 mg daily 3. Pain Management: Tylenol as needed. Well controlled  4. Mood: Provide emotional support, has had period of anxiety.              -antipsychotic agents: N/A 5. Neuropsych: This patient is capable of making decisions on his own behalf. 6. Skin/Wound Care: Routine skin checks  -eucerin cream/vaseline for dry skin- switched to Amlactin to help exfoilate 7. Fluids/Electrolytes/Nutrition: encourage appropriate PO 8.  Dysphagia.  Follow-up speech therapy.  Presently on dysphagia #2 thin liquid diet.  -tolerating diet well, needs to take his time 9.  Hypertension.  Lisinopril 40 mg daily decreased to 20mg  on 5/23  -5/27 bp's well controlled, continue same regimen  -5/31: bp remains pretty soft. Hold lisinopril Vitals:   11/11/19 1937 11/12/19 0500  BP: 100/82 104/60  Pulse: (!) 58 60  Resp: 16 16  Temp: 98.4 F (36.9 C) 97.8 F (36.6 C)  SpO2: 100% 100%   10.  Seizure prophylaxis.  Keppra 500 mg twice daily.  EEG negative 11.  Hyperlipidemia.  Lipitor 12.  Morbid obesity?   BMI only 28.7 13.  New findings diabetes mellitus.  Hemoglobin A1c 7.4.  SSI.    CBGs have been reasonably controlled  t --no changes  -CM diet 14.  Tobacco abuse.  Counseling 15.  Medical noncompliance.  Counseling 16. Constipation   - senokot 2 tabs daily and has sorbitol prn.   -moving bowels currently 5/30      LOS: 9 days A FACE TO FACE EVALUATION WAS PERFORMED  11/14/19 11/12/2019, 9:24 AM

## 2019-11-13 ENCOUNTER — Inpatient Hospital Stay (HOSPITAL_COMMUNITY): Payer: Medicare Other | Admitting: Physical Therapy

## 2019-11-13 ENCOUNTER — Inpatient Hospital Stay (HOSPITAL_COMMUNITY): Payer: Medicare Other

## 2019-11-13 LAB — BASIC METABOLIC PANEL
Anion gap: 8 (ref 5–15)
BUN: 26 mg/dL — ABNORMAL HIGH (ref 8–23)
CO2: 26 mmol/L (ref 22–32)
Calcium: 9.2 mg/dL (ref 8.9–10.3)
Chloride: 104 mmol/L (ref 98–111)
Creatinine, Ser: 1.13 mg/dL (ref 0.61–1.24)
GFR calc Af Amer: >60 mL/min (ref >60)
GFR calc non Af Amer: >60 mL/min (ref >60)
Glucose, Bld: 141 mg/dL — ABNORMAL HIGH (ref 70–99)
Potassium: 4.8 mmol/L (ref 3.5–5.1)
Sodium: 138 mmol/L (ref 135–145)

## 2019-11-13 LAB — CBC
HCT: 41.5 % (ref 39.0–52.0)
Hemoglobin: 12.9 g/dL — ABNORMAL LOW (ref 13.0–17.0)
MCH: 27.3 pg (ref 26.0–34.0)
MCHC: 31.1 g/dL (ref 30.0–36.0)
MCV: 87.9 fL (ref 80.0–100.0)
Platelets: 236 10*3/uL (ref 150–400)
RBC: 4.72 MIL/uL (ref 4.22–5.81)
RDW: 13.5 % (ref 11.5–15.5)
WBC: 5.1 10*3/uL (ref 4.0–10.5)
nRBC: 0 % (ref 0.0–0.2)

## 2019-11-13 MED ORDER — DEXTROMETHORPHAN-QUINIDINE 20-10 MG PO CAPS
1.0000 | ORAL_CAPSULE | Freq: Two times a day (BID) | ORAL | Status: DC
  Administered 2019-11-13 – 2019-11-16 (×7): 1 via ORAL
  Filled 2019-11-13 (×7): qty 1

## 2019-11-13 NOTE — Patient Care Conference (Signed)
Inpatient RehabilitationTeam Conference and Plan of Care Update Date: 11/13/2019   Time: 2:43 PM    Patient Name: Jeffery Reilly      Medical Record Number: [id number]  Date of Birth: 23-May-1946 Sex: Male         Room/Bed: 4M05C/4M05C-01 Payor Info: Payor: MEDICARE / Plan: MEDICARE PART A AND B / Product Type: *No Product type* /    Admit Date/Time:  11/03/2019  4:37 PM  Primary Diagnosis:  Right middle cerebral artery stroke [facility])  Patient Active Problem List   Diagnosis Date Noted  . Right middle cerebral artery stroke (HCC) 11/03/2019  . Stroke [facility]) 10/30/2019    Expected Discharge Date: Expected Discharge Date: 11/16/19  Team Members Present: Physician leading conference: Dr. Faith Rogue Care Coodinator Present: Cecile Sheerer, LCSWA;Other (comment)(Mikeisha Lemonds Marlyne Beards, RN, BSN, CRRN) Nurse Present: Adam Phenix, LPN PT Present: Carlynn Purl, PT OT Present: Jake Shark, OT SLP Present: Colin Benton, SLP PPS Coordinator present : Edson Snowball, Park Breed, SLP     Current Status/Progress Goal Weekly Team Focus  Bowel/Bladder   Continent of bowel and bladder  Remain continent of bowel and bladder. bm 11/12/19  Assess q shift, Bladder scan and straight cath if no void in 8 hours.   Swallow/Nutrition/ Hydration   Dys. 3 textures with thin liquids, Min A for use of swallowing strategies  Supervision  increased use of swallowing strategies   ADL's   (S) UB ADLs, CGA LB ADLs, min-CGA transfers, RUE improvements in coordination, able to use functionally with cueing and min facilitation  Supervision overall  RUE NMR, d/c planning, ADL transfers, ADL retraining   Mobility   CGA overall, gait >150' w/ or w/o AD, 30/56 Berg  supervision overall  gait, balance, endurance/global strengthening, family education and discharge planning   Communication   Mod A  Min A  increased use of speech intelligibility strategies   Safety/Cognition/ Behavioral Observations  Min A for basic   Min A  basic problem solving, attention and awareness   Pain   No complaints of pain  Remain pain free  Assess q shift and administer prn pain meds when needed   Skin   Dry, flaky. Skin intact.  Maintain skin integrity. Apply lotion to reduce dryness and flakiness       Rehab Goals Patient on target to meet rehab goals: Yes *See Care Plan and progress notes for long and short-term goals.     Barriers to Discharge  Current Status/Progress Possible Resolutions Date Resolved   Nursing                  PT                    OT Home environment access/layout    Pt making good improvements overall. Very motivated to participate in therapy. On track for d/c.           SLP                Care Coordinator Decreased caregiver support;Lack of/limited family support              Discharge Planning/Teaching Needs:  D/c to home with his wife who will provide 24/7 supervision.  Family education as recommended by therapy; Family edu on 6/2 10am-12pm with pt wife and daughter   Team Discussion:  Emotional with all disciplines. Nursing reports low BP's and MD is aware. Family education is schedule for 6/2.  Revisions to Treatment Plan: n/a  Medical Summary Current Status: bp soft, meds held. PBA, eating ok. Weekly Focus/Goal: PBA, bp mgt  Barriers to Discharge: Medical stability   Possible Resolutions to Barriers: lisinopril held, trial of nuedexta   Continued Need for Acute Rehabilitation Level of Care: The patient requires daily medical management by a physician with specialized training in physical medicine and rehabilitation for the following reasons: Direction of a multidisciplinary physical rehabilitation program to maximize functional independence : Yes Medical management of patient stability for increased activity during participation in an intensive rehabilitation regime.: Yes Analysis of laboratory values and/or radiology reports with any subsequent need for medication  adjustment and/or medical intervention. : Yes   I attest that I was present, lead the team conference, and concur with the assessment and plan of the team.   Cristi Loron 11/13/2019, 2:43 PM

## 2019-11-13 NOTE — Plan of Care (Signed)
°  Problem: Consults Goal: RH STROKE PATIENT EDUCATION Description: See Patient Education module for education specifics moderate assistance Outcome: Progressing Goal: Nutrition Consult-if indicated Outcome: Progressing Goal: Diabetes Guidelines if Diabetic/Glucose > 140 Description: If diabetic or lab glucose is > 140 mg/dl - Initiate Diabetes/Hyperglycemia Guidelines & Document Interventions  Outcome: Progressing   Problem: RH BOWEL ELIMINATION Goal: RH STG MANAGE BOWEL WITH ASSISTANCE Description: STG Manage Bowel with moderate Assistance. Outcome: Progressing Goal: RH STG MANAGE BOWEL W/MEDICATION W/ASSISTANCE Description: STG Manage Bowel with Medication with moderate Assistance. Outcome: Progressing   Problem: RH BLADDER ELIMINATION Goal: RH STG MANAGE BLADDER WITH ASSISTANCE Description: STG Manage Bladder With moderate Assistance Outcome: Progressing Goal: RH STG MANAGE BLADDER WITH MEDICATION WITH ASSISTANCE Description: STG Manage Bladder With Medication With moderateAssistance. Outcome: Progressing   Problem: RH SKIN INTEGRITY Goal: RH STG SKIN FREE OF INFECTION/BREAKDOWN Outcome: Progressing Goal: RH STG MAINTAIN SKIN INTEGRITY WITH ASSISTANCE Description: STG Maintain Skin Integrity With moderate Assistance. Outcome: Progressing   Problem: RH SAFETY Goal: RH STG ADHERE TO SAFETY PRECAUTIONS W/ASSISTANCE/DEVICE Description: STG Adhere to Safety Precautions With moderate Assistance/Device. Outcome: Progressing Goal: RH STG DECREASED RISK OF FALL WITH ASSISTANCE Description: STG Decreased Risk of Fall With moderate Assistance. Outcome: Progressing   Problem: RH COGNITION-NURSING Goal: RH STG USES MEMORY AIDS/STRATEGIES W/ASSIST TO PROBLEM SOLVE Description: STG Uses Memory Aids/Strategies With moderate Assistance to Problem Solve. Outcome: Progressing Goal: RH STG ANTICIPATES NEEDS/CALLS FOR ASSIST W/ASSIST/CUES Description: STG Anticipates Needs/Calls for  Assist With Assistance/Cues. Outcome: Progressing   Problem: RH PAIN MANAGEMENT Goal: RH STG PAIN MANAGED AT OR BELOW PT'S PAIN GOAL Description: Pain less than 2 Outcome: Progressing   Problem: RH KNOWLEDGE DEFICIT Goal: RH STG INCREASE KNOWLEDGE OF DIABETES Description: Moderate assistance Outcome: Progressing Goal: RH STG INCREASE KNOWLEDGE OF HYPERTENSION Description: Moderate assistance Outcome: Progressing Goal: RH STG INCREASE KNOWLEDGE OF DYSPHAGIA/FLUID INTAKE Description: Moderate assistance Outcome: Progressing Goal: RH STG INCREASE KNOWLEGDE OF HYPERLIPIDEMIA Description: Moderate assistance Outcome: Progressing Goal: RH STG INCREASE KNOWLEDGE OF STROKE PROPHYLAXIS Description: Moderate assistance Outcome: Progressing   

## 2019-11-13 NOTE — Progress Notes (Signed)
Jeffery Reilly PHYSICAL MEDICINE & REHABILITATION PROGRESS NOTE   Subjective/Complaints: Up with PT walking down hall. PT notes frequent episodes of tears/emotions by pt consistent with PBA. Pt had an episode or two while we visited this morning also  ROS: Patient denies fever, rash, sore throat, blurred vision, nausea, vomiting, diarrhea, cough, shortness of breath or chest pain, joint or back pain, headache, .    Objective:   No results found. No results for input(s): WBC, HGB, HCT, PLT in the last 72 hours. No results for input(s): NA, K, CL, CO2, GLUCOSE, BUN, CREATININE, CALCIUM in the last 72 hours.  Intake/Output Summary (Last 24 hours) at 11/13/2019 0913 Last data filed at 11/13/2019 0800 Gross per 24 hour  Intake 684 ml  Output 300 ml  Net 384 ml     Physical Exam: Vital Signs Blood pressure (!) 89/59, pulse (!) 59, temperature 97.8 F (36.6 C), resp. rate 20, height 5\' 10"  (1.778 m), weight 90.7 kg, SpO2 100 %.  Physical Exam   Constitutional: No distress . Vital signs reviewed. HEENT: EOMI, oral membranes moist Neck: supple Cardiovascular: RRR without murmur. No JVD    Respiratory/Chest: CTA Bilaterally without wheezes or rales. Normal effort    GI/Abdomen: BS +, non-tender, non-distended Ext: no clubbing, cyanosis, or edema Psych: pleasant but frequently emotional/teaful. Musc: no swelling or pain Neurological: He is alert and oriented x 3. Very dysarthric. Drooling while ambulating. Had some awareness of drooling. Shuffling gait Sensation intact to light touch x 4 extremities. Ongoing apraxia. RUE 4/5 . RLE 3+ to 4/5. LUE and LLE 5/5. Decreased LT RUE and RLE.  Skin: Skin is warm and dry. chronic venous changes BLE   Assessment/Plan: 1. Functional deficits secondary to R MCA stroke which require 3+ hours per day of interdisciplinary therapy in a comprehensive inpatient rehab setting.  Physiatrist is providing close team supervision and 24 hour management of active  medical problems listed below.  Physiatrist and rehab team continue to assess barriers to discharge/monitor patient progress toward functional and medical goals  Care Tool:  Bathing    Body parts bathed by patient: Right arm, Left arm, Chest, Abdomen, Front perineal area, Right upper leg, Left upper leg, Face, Buttocks, Right lower leg, Left lower leg   Body parts bathed by helper: Left lower leg     Bathing assist Assist Level: Minimal Assistance - Patient > 75%     Upper Body Dressing/Undressing Upper body dressing   What is the patient wearing?: Pull over shirt    Upper body assist Assist Level: Supervision/Verbal cueing    Lower Body Dressing/Undressing Lower body dressing      What is the patient wearing?: Incontinence brief, Pants     Lower body assist Assist for lower body dressing: Minimal Assistance - Patient > 75%     Toileting Toileting    Toileting assist Assist for toileting: Maximal Assistance - Patient 25 - 49%     Transfers Chair/bed transfer  Transfers assist     Chair/bed transfer assist level: Contact Guard/Touching assist     Locomotion Ambulation   Ambulation assist      Assist level: Contact Guard/Touching assist Assistive device: No Device Max distance: 150'   Walk 10 feet activity   Assist     Assist level: Contact Guard/Touching assist Assistive device: No Device   Walk 50 feet activity   Assist    Assist level: Contact Guard/Touching assist Assistive device: No Device    Walk 150 feet activity  Assist    Assist level: Contact Guard/Touching assist Assistive device: No Device    Walk 10 feet on uneven surface  activity   Assist Walk 10 feet on uneven surfaces activity did not occur: Safety/medical concerns(decreased balance, motor planning, and R foot clearance)         Wheelchair     Assist   Type of Wheelchair: Manual    Wheelchair assist level: Supervision/Verbal cueing, Moderate  Assistance - Patient 50 - 74% Max wheelchair distance: 150'    Wheelchair 50 feet with 2 turns activity    Assist        Assist Level: Supervision/Verbal cueing   Wheelchair 150 feet activity     Assist      Assist Level: Moderate Assistance - Patient 50 - 74%   Blood pressure (!) 89/59, pulse (!) 59, temperature 97.8 F (36.6 C), resp. rate 20, height 5\' 10"  (1.778 m), weight 90.7 kg, SpO2 100 %.  Medical Problem List and Plan: 1.   right MCA scattered and single punctate right cerebellar infarct with right M1 occlusion status post right M1 thrombectomy.   -Pt has history of L subcorical and pontine  with chronic right-sided residual weakness             -patient may  shower             -ELOS/Goals: 11/16/19- Supervision, team conference today  -Continue CIR PT, OT, SLP  2.  Antithrombotics: -DVT/anticoagulation: continue with sq heparin.  -recent dopplers on 5/20 were clear             -antiplatelet therapy: Aspirin 81 mg daily and Plavix 75 mg daily 3. Pain Management: Tylenol as needed. Well controlled  4. Mood: Provide emotional support, has had period of anxiety.              -antipsychotic agents: N/A  -begin trial of nuedexta for pseudobulbar affect 5. Neuropsych: This patient is capable of making decisions on his own behalf. 6. Skin/Wound Care: Routine skin checks  -eucerin cream/vaseline for dry skin- switched to Amlactin to help exfoilate 7. Fluids/Electrolytes/Nutrition: encourage appropriate PO  -check labs today 8.  Dysphagia.  Follow-up speech therapy.  Presently on dysphagia #2 thin liquid diet.  -tolerating diet well, needs to take his time 9.  Hypertension.  Lisinopril 40 mg daily decreased to 20mg  on 5/23  -5/27 bp's well controlled, continue same regimen  -6/1, lisinopril now held. bp's still soft   -encourage fluids   -TEDS   -recheck labs today Vitals:   11/12/19 1952 11/13/19 0403  BP: (!) 91/57 (!) 89/59  Pulse: 62 (!) 59  Resp:     Temp: 98.4 F (36.9 C) 97.8 F (36.6 C)  SpO2: 99% 100%   10.  Seizure prophylaxis.  Keppra 500 mg twice daily.  EEG negative 11.  Hyperlipidemia.  Lipitor 12.  Morbid obesity?   BMI only 28.7 13.  New findings diabetes mellitus.  Hemoglobin A1c 7.4.  SSI.   CBGs have been reasonably controlled 6/1  -CM diet 14.  Tobacco abuse.  Counseling 15.  Medical noncompliance.  Counseling 16. Constipation   - senokot 2 tabs daily and has sorbitol prn.   -moving bowels currently       LOS: 10 days A FACE TO FACE EVALUATION WAS PERFORMED  11/14/19 11/13/2019, 9:13 AM

## 2019-11-13 NOTE — Progress Notes (Signed)
Physical Therapy Session Note  Patient Details  Name: Jeffery Reilly MRN: [id number] Date of Birth: [birth year]  Today's Date: 11/13/2019 PT Individual Time: 0800-0858 AND 1455-1520 PT Individual Time Calculation (min): 58 min AND 25 min  Short Term Goals: Week 2:  PT Short Term Goal 1 (Week 2): =LTGs due to ELOS  Skilled Therapeutic Interventions/Progress Updates:   Session 1:  Pt received in supine and agreeable to therapy, no c/o pain. Supervision bed mobility and CGA sit>stand from low bed surface w/ verbal cues for weight shifting technique. Ambulated to/from therapy gym w/ CGA w/o AD and w/o R foot-up brace and shoes to emphasize increasing R foot clearance and step length. Verbal cues to increase gait speed. Practiced stair negotiation w/ B rails, as per home set-up, negotiated up/down x12 w/ supervision and verbal cues to attend to RUE on rail. Worked on dynamic standing balance remainder of session w/ emphasis on lateral weight shifting and single leg balance while performing airex pad toe taps. Performed R and L LEs 2x5 reps each w/ min assist fading to CGA for balance. Pt weight shifting well laterally, increased difficulty maintaining balance once either extremity is in single limb support, no significant difference between extremities. Pt suddenly became unresponsive, dropped his drink in his hand, then very tearful. After a few seconds, pt nodded that he was ok just very emotional. He denied pain and when asked if anything triggered this episode from an emotional standpoint, he nodded his head "no". Pt w/ multiple episodes of psuedobulbar affect around this therapist, however they are always triggered by an emotional thought or event, this event appeared to not be triggered by anything in particular. Pt agreeable to walk back to room. Ambulated back to room w/ same cues and assistance as detailed above, ended session in recliner and all needs in reach. Made MD aware of episode and pt remained  tearful on way back to room and once back in recliner. Max encouragement from therapist that he was safe and this was normal for someone after a stroke. Pt's frustration w/ emotional outburst appeared to heighten the affect. After multiple minutes, he did return to a calmer resting state and he denied any pain and appeared in no more distress. Made RN aware of status.   Session 2:  Pt in recliner and agreeable to therapy, no c/o pain. Ambulated to/from therapy gym w/ supervision w/ RW, verbal cues to increase foot clearance w/ RLE. Practiced ambulating over uneven surfaces including ramp and mulch, both w/ supervision, and over curb w/ RW w/ CGA. Educated pt on importance of waiting until [facility] therapist clears him to ambulate over ground at home to feed his hogs. Pt verbalized understanding. Practiced car transfer w/ supervision as well to SUV height. Ambulated back to room, ended session in recliner and all needs in reach.   Therapy Documentation Precautions:  Precautions Precautions: Fall Precaution Comments: Mild R hemi, baseline Restrictions Weight Bearing Restrictions: No  Therapy/Group: Individual Therapy  Ever Gustafson K Mckyle Solanki 11/13/2019, 9:01 AM

## 2019-11-13 NOTE — Progress Notes (Signed)
Occupational Therapy Session Note  Patient Details  Name: Jeffery Reilly MRN: [id number] Date of Birth: 23-Apr-1946  Today's Date: 11/13/2019 OT Individual Time: 1751-0258 OT Individual Time Calculation (min): 69 min   Session 2: OT Individual Time: 5277-8242 OT Individual Time Calculation (min): 40 min   Short Term Goals: Week 1:  OT Short Term Goal 1 (Week 1): Pt will complete toilet transfers with Min A sit<stand OT Short Term Goal 2 (Week 1): Pt will complete shower transfer with Min A using LRAD OT Short Term Goal 3 (Week 1): Pt will complete 2 grooming tasks while standing at the sink to improve standing balance and standing endurance  Skilled Therapeutic Interventions/Progress Updates:    Pt received sitting in recliner with no c/o pain, agreeable to session. Pt used RW to complete 150 ft of functional mobility with CGA-(S) at very slow pace. Reviewed upcoming d/c and conference with pt. Pt stood at edge of therapy mat and completed BUE weightbearing with focus on RUE stabilization and engagement of core and latissimus dorsi muscles. Attempted push up but pt unable to motor plan despite demo and instruction. Pt transitioned into quadroped on mat with min A, slow and effortful. Pt completed functional reaching with alternating R and L to challenge core stabilization and shoulder flexion. Pt required min guard for balance stabilization. Pt took rest break in sidelying on mat. Pt transitioned back into quadroped and completed alternating leg lift to challenge glute strength with core stabilization. Pt required min A to maintain balance and tactile cueing to follow motor directions. Pt used RW to return to room to obtain w/c to go outside for increasing mood. Pt very appreciative and reports he spends a lot of time outdoors at baseline. Pt was returned to his room where he completed standing level toileting with CGA. Pt was left sitting up in the recliner with all needs met, chair alarm set.    Session 2:  Pt received supine with no c/o pain. Pt agreeable to take shower. Pt completed sit > stand from EOB with CGA. Pt used RW to transfer into bathroom, min cueing for clothing management prior to sitting on TTB. Pt demonstrating good safety awareness and using appropriate grab bars before sitting. Pt washed UB and LB seated with (S). CGA required to stand 2/2 slippery floor to wash buttocks. Cueing for upright posture. Pt transitioned to EOB from shower with CGA. Pt applied lotion independently. Set up assist for donning shirt. CGA for donning pants. Pt able to don socks with min A. Pt left sitting up in recliner with all needs met.  Therapy Documentation Precautions:  Precautions Precautions: Fall Precaution Comments: Mild Rt hemi Restrictions Weight Bearing Restrictions: No   Therapy/Group: Individual Therapy  Curtis Sites 11/13/2019, 6:49 AM

## 2019-11-13 NOTE — Progress Notes (Signed)
Physical Therapy Discharge Summary  Patient Details  Name: Jeffery Reilly MRN: [id number] Date of Birth: 28-Jan-1946  Patient has met 11 of 11 long term goals due to improved activity tolerance, improved balance, improved postural control, ability to compensate for deficits, functional use of  right lower extremity and left lower extremity, improved attention, improved awareness and improved coordination.  Patient to discharge at an ambulatory level Supervision.      Patient's care partner is independent to provide the necessary physical assistance at discharge.  Reasons goals not met: All PT goals met   Recommendation:  Patient will benefit from ongoing skilled PT services in home health setting to continue to advance safe functional mobility, address ongoing impairments in endurance, global strengthening, RLE NMR, and balance, and minimize fall risk.  Equipment: No equipment provided  Reasons for discharge: treatment goals met and discharge from hospital  Patient/family agrees with progress made and goals achieved: Yes  PT Discharge Precautions/Restrictions Precautions Precautions: Fall Precaution Comments: Mild R hemi, baseline Restrictions Weight Bearing Restrictions: No Vital Signs Therapy Vitals Temp: 97.8 F (36.6 C) Pulse Rate: (!) 59 BP: (!) 89/59 Patient Position (if appropriate): Lying Oxygen Therapy SpO2: 100 % O2 Device: Room Air Vision/Perception  Perception Perception: Impaired Comments: Mild R inattention, appears baseline 2/2 previous stroke Praxis Praxis: Impaired Praxis Impairment Details: Motor planning Praxis-Other Comments: Improved since evaluation  Sensation Sensation Light Touch: Appears Intact Coordination Gross Motor Movements are Fluid and Coordinated: No Heel Shin Test: RLE unable 2/2 weakness, decreased ROM Motor  Motor Motor: Hemiplegia;Motor apraxia Motor - Discharge Observations: Mild R hemi, motor apraxia R side  Mobility Bed  Mobility Bed Mobility: Rolling Right;Rolling Left;Supine to Sit;Sit to Supine Rolling Right: Independent Rolling Left: Independent Supine to Sit: Independent Sit to Supine: Independent Transfers Transfers: Sit to Stand;Stand to Sit;Stand Pivot Transfers Sit to Stand: Supervision/Verbal cueing Stand to Sit: Supervision/Verbal cueing Stand Pivot Transfers: Supervision/Verbal cueing Stand Pivot Transfer Details: Verbal cues for precautions/safety Transfer (Assistive device): None Locomotion  Gait Ambulation: Yes Gait Assistance: Supervision/Verbal cueing Gait Distance (Feet): 250 Feet Assistive device: None(supervision w/ or w/o AD) Gait Assistance Details: Verbal cues for gait pattern Gait Assistance Details: verbal cues for R foot clearance, increased R step length Gait Gait: Yes Gait Pattern: Impaired Gait Pattern: Decreased dorsiflexion - right;Poor foot clearance - right;Shuffle;Trunk flexed Gait velocity: decreased Stairs / Additional Locomotion Stairs: Yes Stairs Assistance: Supervision/Verbal cueing Stair Management Technique: Two rails Number of Stairs: 12 Height of Stairs: 6 Wheelchair Mobility Wheelchair Mobility: No(pt primary ambulator)  Trunk/Postural Assessment  Cervical Assessment Cervical Assessment: Exceptions to WFL(forward head) Thoracic Assessment Thoracic Assessment: Exceptions to WFL(rounded shoulders) Lumbar Assessment Lumbar Assessment: Exceptions to WFL(posterior pelvic tilt) Postural Control Postural Control: Deficits on evaluation(delayed/insufficient, improved since evaluation)  Balance Balance Balance Assessed: Yes Static Sitting Balance Static Sitting - Level of Assistance: 7: Independent Dynamic Sitting Balance Dynamic Sitting - Level of Assistance: 7: Independent Static Standing Balance Static Standing - Level of Assistance: 5: Stand by assistance Dynamic Standing Balance Dynamic Standing - Level of Assistance: 5: Stand by assistance   Berg Balance Scale Score on 11/12/2019: 30/56 Extremity Assessment  RLE Assessment RLE Assessment: Exceptions to [facility] General Strength Comments: grossly 4+/5 proximal to distal except ankle DF 4-/5 LLE Assessment LLE Assessment: Within Functional Limits Active Range of Motion (AROM) Comments: WFL for all functional mobility, decreased hip flexion in sitting, tight hamstrings and heel cords General Strength Comments: Grossly in sitting 4+/5to  5/5 throughout    Amy K  Tally 11/13/2019, 7:33 AM   Barrie Folk PT, DPT  11/15/19 12:09 PM

## 2019-11-13 NOTE — Plan of Care (Signed)
  Problem: Consults Goal: RH STROKE PATIENT EDUCATION Description: See Patient Education module for education specifics moderate assistance Outcome: Progressing Goal: Nutrition Consult-if indicated Outcome: Progressing Goal: Diabetes Guidelines if Diabetic/Glucose > 140 Description: If diabetic or lab glucose is > 140 mg/dl - Initiate Diabetes/Hyperglycemia Guidelines & Document Interventions  Outcome: Progressing   Problem: RH BOWEL ELIMINATION Goal: RH STG MANAGE BOWEL WITH ASSISTANCE Description: STG Manage Bowel with moderate Assistance. Outcome: Progressing Goal: RH STG MANAGE BOWEL W/MEDICATION W/ASSISTANCE Description: STG Manage Bowel with Medication with moderate Assistance. Outcome: Progressing   Problem: RH BLADDER ELIMINATION Goal: RH STG MANAGE BLADDER WITH ASSISTANCE Description: STG Manage Bladder With moderate Assistance Outcome: Progressing Goal: RH STG MANAGE BLADDER WITH MEDICATION WITH ASSISTANCE Description: STG Manage Bladder With Medication With moderateAssistance. Outcome: Progressing   Problem: RH SKIN INTEGRITY Goal: RH STG SKIN FREE OF INFECTION/BREAKDOWN Outcome: Progressing Goal: RH STG MAINTAIN SKIN INTEGRITY WITH ASSISTANCE Description: STG Maintain Skin Integrity With moderate Assistance. Outcome: Progressing   Problem: RH SAFETY Goal: RH STG ADHERE TO SAFETY PRECAUTIONS W/ASSISTANCE/DEVICE Description: STG Adhere to Safety Precautions With moderate Assistance/Device. Outcome: Progressing Goal: RH STG DECREASED RISK OF FALL WITH ASSISTANCE Description: STG Decreased Risk of Fall With moderate Assistance. Outcome: Progressing   Problem: RH COGNITION-NURSING Goal: RH STG USES MEMORY AIDS/STRATEGIES W/ASSIST TO PROBLEM SOLVE Description: STG Uses Memory Aids/Strategies With moderate Assistance to Problem Solve. Outcome: Progressing Goal: RH STG ANTICIPATES NEEDS/CALLS FOR ASSIST W/ASSIST/CUES Description: STG Anticipates Needs/Calls for  Assist With Assistance/Cues. Outcome: Progressing   Problem: RH PAIN MANAGEMENT Goal: RH STG PAIN MANAGED AT OR BELOW PT'S PAIN GOAL Description: Pain less than 2 Outcome: Progressing   Problem: RH KNOWLEDGE DEFICIT Goal: RH STG INCREASE KNOWLEDGE OF DIABETES Description: Moderate assistance Outcome: Progressing Goal: RH STG INCREASE KNOWLEDGE OF HYPERTENSION Description: Moderate assistance Outcome: Progressing Goal: RH STG INCREASE KNOWLEDGE OF DYSPHAGIA/FLUID INTAKE Description: Moderate assistance Outcome: Progressing Goal: RH STG INCREASE KNOWLEGDE OF HYPERLIPIDEMIA Description: Moderate assistance Outcome: Progressing Goal: RH STG INCREASE KNOWLEDGE OF STROKE PROPHYLAXIS Description: Moderate assistance Outcome: Progressing   

## 2019-11-14 ENCOUNTER — Inpatient Hospital Stay (HOSPITAL_COMMUNITY): Payer: Medicare Other | Admitting: Physical Therapy

## 2019-11-14 ENCOUNTER — Encounter (HOSPITAL_COMMUNITY): Payer: Medicare Other

## 2019-11-14 ENCOUNTER — Ambulatory Visit (HOSPITAL_COMMUNITY): Payer: Medicare Other | Admitting: Physical Therapy

## 2019-11-14 ENCOUNTER — Inpatient Hospital Stay (HOSPITAL_COMMUNITY): Payer: Medicare Other | Admitting: Speech Pathology

## 2019-11-14 NOTE — Progress Notes (Signed)
Physical Therapy Session Note  Patient Details  Name: Jeffery Reilly MRN: [id number] Date of Birth: Aug 04, 1945  Today's Date: 11/14/2019 PT Individual Time: 1133-1211 AND 1415-1455 PT Individual Time Calculation (min): 38 min and 40 min   Short Term Goals: Week 2:  PT Short Term Goal 1 (Week 2): =LTGs due to ELOS  Skilled Therapeutic Interventions/Progress Updates:   Pt received sitting in WC and agreeable to PT.  PT assisted pt to don brace and Bil shoes with min assist due to poor fitting. Asked family present to purchase bigger size once at home, but unsure of plans for follow through. Family present for entire session for education. Car transfer training with supervision assist overall and cues for safety and improved anterior weight shift to achive standing. Stair management training with BUEx 4 steps. Cues step to gait pattern with poor  [place] over, no LOB, but needed constant cues for RLE positioning fully on on step.  Gait training with RW x 148f with supervision assist and min cues for step length/height on the R. Cues for family to provide appropriate cues with gait in dynamic environments and reduce fall risk. Patient returned to room and left sitting in recliner with call bell in reach and all needs met.    Session 2.  Pt received sitting in recliner and agreeable to PT. Pt reports urngent need to urinate. Ambulatory transfer to toilet with CGA for safety. Pt able to void at toilet, with poor aim, resulting in soiled clothing. Ambulatory transfer to recliner with supervision A - min A using RW. PT obtained clean brief and pants. Sit<>stand to doff and don lower body clothing with CGA-supervision assist and increased time. Pt Assisted pt to don shoes with moderate assist for time management and repositioned Foot up brace to improved effectiveness of DF at ankle. Gait training with RW x 1533fwith supervision assist and min cues for step length RLE. Pt returned to room and performed **  transfer to bed with RW and supervision assist. Sit>supine completed with supervision assist, and left supine in bed with call bell in reach and all needs met.          Therapy Documentation Precautions:  Precautions Precautions: Fall Precaution Comments: Mild R hemi, baseline Restrictions Weight Bearing Restrictions: No   Pain: Pain Assessment Pain Scale: 0-10 Pain Score: 0-No pain   Therapy/Group: Individual Therapy  AuLorie Phenix/07/2019, 12:31 PM

## 2019-11-14 NOTE — Discharge Summary (Signed)
Physician Discharge Summary  Patient ID: Jeffery Reilly MRN: [id number] DOB/AGE: January 07, 1946 74 y.o.  Admit date: 11/03/2019 Discharge date: 11/16/2019  Discharge Diagnoses:  Principal Problem:   Right middle cerebral artery stroke [facility]) DVT prophylaxis Dysphagia Hypertension Mood stabilization Seizure prophylaxis Hyperlipidemia Morbid obesity New findings of diabetes mellitus Tobacco abuse Constipation Medical noncompliance   Discharged Condition: Stable  Significant Diagnostic Studies: CT Angio Head W or Wo Contrast  Result Date: 10/30/2019 CLINICAL DATA:  Code stroke follow-up EXAM: CT ANGIOGRAPHY HEAD AND NECK TECHNIQUE: Multidetector CT imaging of the head and neck was performed using the standard protocol during bolus administration of intravenous contrast. Multiplanar CT image reconstructions and MIPs were obtained to evaluate the vascular anatomy. Carotid stenosis measurements (when applicable) are obtained utilizing NASCET criteria, using the distal internal carotid diameter as the denominator. CONTRAST:  75mL OMNIPAQUE IOHEXOL 350 MG/ML SOLN COMPARISON:  Earlier same day FINDINGS: CT HEAD FINDINGS Brain: There is no acute intracranial hemorrhage, mass effect, or edema. No new loss of gray-white differentiation. Chronic infarctions and chronic microvascular ischemic changes as described previously. Vascular: No new finding Skull: No new finding Sinuses: No new finding Orbits: No new finding Review of the MIP images confirms the above findings CTA NECK FINDINGS Aortic arch: Great vessel origins are patent. There is direct origin of the left vertebral artery from the arch. Right carotid system: Patent. No measurable stenosis at the ICA origin. Retropharyngeal course of the ICA. Left carotid system: Patent. No measurable stenosis at the ICA origin. Retropharyngeal course of the ICA. Vertebral arteries: Patent and codominant. Skeleton: Multilevel degenerative changes of the cervical spine.  Other neck: No mass or adenopathy. Upper chest: No apical lung mass. Review of the MIP images confirms the above findings CTA HEAD FINDINGS Anterior circulation: Intracranial internal carotid arteries are patent. There is occlusion of the distal right M1 MCA. Diminished flow within the more distal right MCA territory. Anterior and left middle cerebral arteries are patent. Posterior circulation: Intracranial vertebral arteries are patent. Basilar artery is patent with focal short segment moderate stenosis including the level of left AICA origin. Posterior cerebral arteries are patent. There is a patent right posterior communicating artery. Venous sinuses: Not well evaluated on this study. Review of the MIP images confirms the above findings IMPRESSION: Distal right M1 MCA occlusion. Partial reconstitution of more distal right MCA territory. No acute intracranial hemorrhage.  ASPECT score remains 10. No hemodynamically significant stenosis in the neck. These results were called by telephone at the time of interpretation on 10/30/2019 at 4:28 pm to provider [facility] , who verbally acknowledged these results. Electronically Signed   By: Guadlupe Spanish M.D.   On: 10/30/2019 16:32   CT Angio Neck W and/or Wo Contrast  Result Date: 10/30/2019 CLINICAL DATA:  Code stroke follow-up EXAM: CT ANGIOGRAPHY HEAD AND NECK TECHNIQUE: Multidetector CT imaging of the head and neck was performed using the standard protocol during bolus administration of intravenous contrast. Multiplanar CT image reconstructions and MIPs were obtained to evaluate the vascular anatomy. Carotid stenosis measurements (when applicable) are obtained utilizing NASCET criteria, using the distal internal carotid diameter as the denominator. CONTRAST:  75mL OMNIPAQUE IOHEXOL 350 MG/ML SOLN COMPARISON:  Earlier same day FINDINGS: CT HEAD FINDINGS Brain: There is no acute intracranial hemorrhage, mass effect, or edema. No new loss of gray-white  differentiation. Chronic infarctions and chronic microvascular ischemic changes as described previously. Vascular: No new finding Skull: No new finding Sinuses: No new finding Orbits: No new finding Review  of the MIP images confirms the above findings CTA NECK FINDINGS Aortic arch: Great vessel origins are patent. There is direct origin of the left vertebral artery from the arch. Right carotid system: Patent. No measurable stenosis at the ICA origin. Retropharyngeal course of the ICA. Left carotid system: Patent. No measurable stenosis at the ICA origin. Retropharyngeal course of the ICA. Vertebral arteries: Patent and codominant. Skeleton: Multilevel degenerative changes of the cervical spine. Other neck: No mass or adenopathy. Upper chest: No apical lung mass. Review of the MIP images confirms the above findings CTA HEAD FINDINGS Anterior circulation: Intracranial internal carotid arteries are patent. There is occlusion of the distal right M1 MCA. Diminished flow within the more distal right MCA territory. Anterior and left middle cerebral arteries are patent. Posterior circulation: Intracranial vertebral arteries are patent. Basilar artery is patent with focal short segment moderate stenosis including the level of left AICA origin. Posterior cerebral arteries are patent. There is a patent right posterior communicating artery. Venous sinuses: Not well evaluated on this study. Review of the MIP images confirms the above findings IMPRESSION: Distal right M1 MCA occlusion. Partial reconstitution of more distal right MCA territory. No acute intracranial hemorrhage.  ASPECT score remains 10. No hemodynamically significant stenosis in the neck. These results were called by telephone at the time of interpretation on 10/30/2019 at 4:28 pm to provider [facility] , who verbally acknowledged these results. Electronically Signed   By: Guadlupe Spanish M.D.   On: 10/30/2019 16:32   MR ANGIO HEAD WO CONTRAST  Result Date:  10/31/2019 CLINICAL DATA:  Stroke follow-up. EXAM: MRI HEAD WITHOUT CONTRAST MRA HEAD WITHOUT CONTRAST TECHNIQUE: Multiplanar, multiecho pulse sequences of the brain and surrounding structures were obtained without intravenous contrast. Angiographic images of the head were obtained using MRA technique without contrast. COMPARISON:  Head CT Oct 30, 2019 FINDINGS: The study is degraded by motion. MRI HEAD FINDINGS Brain: Scattered foci of restricted diffusion is seen throughout the right MCA territory with a cluster at the temporal occipital region, consistent with acute/subacute infarcts. A focus of restricted diffusion is also seen right cerebellar hemisphere. Scattered and confluent foci of T2 hyperintensity are seen within the white matter of cerebral hemispheres, nonspecific, most likely related to chronic small vessel ischemia. Remote infarcts are seen in the bilateral corona radiata, bilateral basal ganglia and pons. Foci of susceptibility artifact are seen in the bilateral parietal regions, pons and right cerebellar hemisphere, most consistent with hemosiderin deposits. No acute hemorrhage, hydrocephalus, extra-axial collection or mass lesion. Vascular: Normal flow voids. Skull and upper cervical spine: Reversal of the cervical curvature. There is diffuse decreased T1 signal within the visualized upper cervical spine, may represent red marrow reconversion versus marrow replacement. Correlate clinically. Sinuses/Orbits: Mucosal thickening throughout the paranasal sinuses with fluid level within the left sphenoid sinus. The orbits are grossly unremarkable. Other: None. MRA HEAD FINDINGS Visualized upper cervical and intracranial internal carotid arteries have normal flow related enhancement without evidence of occlusion or stenosis. Status post stenting of the mid/distal M1/MCA segment. Susceptibility artifact precludes evaluation of state patency. However, normal flow related enhancement is seen in most of the  M2 branches, with the exception of one middle division branch which has attenuated flow related enhancement (series 3, image 109). The bilateral ACA and left MCA vascular tree have normal flow related enhancement. The intracranial segment of the bilateral vertebral arteries, the basilar artery and bilateral posterior cerebral arteries have normal course and caliber and flow related enhancement without evidence  of stenosis or occlusion. IMPRESSION: 1. Scattered small foci of restricted diffusion throughout the right MCA territory with a cluster at the temporal occipital region, consistent with acute/subacute infarcts. 2. A focus of restricted diffusion is also seen in the right cerebellar hemisphere. 3. Advanced chronic small vessel ischemia. Remote infarcts in the bilateral corona radiata, bilateral basal ganglia and pons. 4. Status post stenting of the mid/distal M1/MCA segment. Normal flow related enhancement is seen in most of the M2 branches, with the exception of one middle division branch which has attenuated flow related enhancement. 5. Diffuse decreased T1 signal within the visualized upper cervical spine, may represent red marrow reconversion versus marrow replacement. Correlate clinically. Electronically Signed   By: Baldemar LenisKatyucia  De Macedo Rodrigues M.D.   On: 10/31/2019 15:36   MR BRAIN WO CONTRAST  Result Date: 10/31/2019 CLINICAL DATA:  Stroke follow-up. EXAM: MRI HEAD WITHOUT CONTRAST MRA HEAD WITHOUT CONTRAST TECHNIQUE: Multiplanar, multiecho pulse sequences of the brain and surrounding structures were obtained without intravenous contrast. Angiographic images of the head were obtained using MRA technique without contrast. COMPARISON:  Head CT Oct 30, 2019 FINDINGS: The study is degraded by motion. MRI HEAD FINDINGS Brain: Scattered foci of restricted diffusion is seen throughout the right MCA territory with a cluster at the temporal occipital region, consistent with acute/subacute infarcts. A focus  of restricted diffusion is also seen right cerebellar hemisphere. Scattered and confluent foci of T2 hyperintensity are seen within the white matter of cerebral hemispheres, nonspecific, most likely related to chronic small vessel ischemia. Remote infarcts are seen in the bilateral corona radiata, bilateral basal ganglia and pons. Foci of susceptibility artifact are seen in the bilateral parietal regions, pons and right cerebellar hemisphere, most consistent with hemosiderin deposits. No acute hemorrhage, hydrocephalus, extra-axial collection or mass lesion. Vascular: Normal flow voids. Skull and upper cervical spine: Reversal of the cervical curvature. There is diffuse decreased T1 signal within the visualized upper cervical spine, may represent red marrow reconversion versus marrow replacement. Correlate clinically. Sinuses/Orbits: Mucosal thickening throughout the paranasal sinuses with fluid level within the left sphenoid sinus. The orbits are grossly unremarkable. Other: None. MRA HEAD FINDINGS Visualized upper cervical and intracranial internal carotid arteries have normal flow related enhancement without evidence of occlusion or stenosis. Status post stenting of the mid/distal M1/MCA segment. Susceptibility artifact precludes evaluation of state patency. However, normal flow related enhancement is seen in most of the M2 branches, with the exception of one middle division branch which has attenuated flow related enhancement (series 3, image 109). The bilateral ACA and left MCA vascular tree have normal flow related enhancement. The intracranial segment of the bilateral vertebral arteries, the basilar artery and bilateral posterior cerebral arteries have normal course and caliber and flow related enhancement without evidence of stenosis or occlusion. IMPRESSION: 1. Scattered small foci of restricted diffusion throughout the right MCA territory with a cluster at the temporal occipital region, consistent with  acute/subacute infarcts. 2. A focus of restricted diffusion is also seen in the right cerebellar hemisphere. 3. Advanced chronic small vessel ischemia. Remote infarcts in the bilateral corona radiata, bilateral basal ganglia and pons. 4. Status post stenting of the mid/distal M1/MCA segment. Normal flow related enhancement is seen in most of the M2 branches, with the exception of one middle division branch which has attenuated flow related enhancement. 5. Diffuse decreased T1 signal within the visualized upper cervical spine, may represent red marrow reconversion versus marrow replacement. Correlate clinically. Electronically Signed   By: Seymour BarsKatyucia  De  Melchor Amour M.D.   On: 10/31/2019 15:36   IR CT Head Ltd  Result Date: 10/30/2019 INDICATION: 74 year old male with acute stroke, right MCA M1 occlusion EXAM: ULTRASOUND GUIDED ACCESS RIGHT COMMON FEMORAL ARTERY CERVICAL AND CEREBRAL ANGIOGRAM MECHANICAL THROMBECTOMY RIGHT MCA/M1 RESCUE STENT OF RIGHT MCA INTRACRANIAL ATHEROSCLEROSIS ANGIO-SEAL FOR CLOSURE COMPARISON:  CT imaging same day MEDICATIONS: 3 mg intra arterial integral and, 650 mg aspirin OG, 300 mg Plavix OG ANESTHESIA/SEDATION: The anesthesia team was present to provide general endotracheal tube anesthesia and for patient monitoring during the procedure. Intubation was performed in negative pressure [place] in neuro IR holding. Interventional neuro radiology nursing staff was also present. CONTRAST:  170 cc Omnipaque 300 FLUOROSCOPY TIME:  Fluoroscopy Time: 40 minutes 42 seconds (3,316 mGy). COMPLICATIONS: None TECHNIQUE: Informed written consent was obtained from the patient's family after a thorough discussion of the procedural risks, benefits and alternatives. Specific risks discussed include: Bleeding, infection, contrast reaction, kidney injury/failure, need for further procedure/surgery, arterial injury or dissection, embolization to new territory, intracranial hemorrhage (10-15% risk),  neurologic deterioration, cardiopulmonary collapse, death. All questions were addressed. Maximal Sterile Barrier Technique was utilized including during the procedure including caps, mask, sterile gowns, sterile gloves, sterile drape, hand hygiene and skin antiseptic. A timeout was performed prior to the initiation of the procedure. The anesthesia team was present to provide general endotracheal tube anesthesia and for patient monitoring during the procedure. Interventional neuro radiology nursing staff was also present. FINDINGS: Initial Findings: Right internal carotid artery: Tortuosity of the cervical segment. Vertical and petrous segment patent with normal course caliber contour. Cavernous segment patent. Clinoid segment patent. Antegrade flow of the ophthalmic artery. Ophthalmic segment patent. Terminus patent. Right MCA: Proximal M1 segment is patent. There is a tapered cuff-off of the mid segment of the M1, in the region the mid orbit. Proximal temporal branch remains patent. TICI 0 flow through the M1 segment. There late filling with collaterals via the right anterior cerebral artery and the right posterior cerebral artery via leptomeningeal collateral vessels. Patent anterior communicating artery with the temporal region supplied by the PCA. Right ACA: A 1 segment patent. A 2 segment perfuses the right territory. Patent anterior communicating artery with significant cross-filling of the left anterior cerebral artery. Completion Findings: During the mechanical thrombectomy, the initial 2 passes resulted in TICI 2B flow, with partial filling of greater than 50% of the right-sided hemispheric MCA branches. The cortical branches after the first 2 passes are filling both from leptomeningeal collaterals as well as through the attenuated M1 segment. The final pass with the EMBO trapped device uncovered atherosclerotic plaque at the distal M1 segment. This atherosclerotic plaque was then treated with drug-eluting  balloon mounted stent. Right MCA: TICI 3 flow was restored through the right hemisphere. The stent was successful in restoring flow through the disease segment of the M1 Right common carotid artery:  Normal course caliber and contour. Right external carotid artery: Patent with antegrade flow. Flat panel CT demonstrates minimal staining within the supra ganglionic right MCA territory with no subarachnoid hemorrhage or large parenchymal hemorrhage. PROCEDURE: Patient was brought to the [facility] suite, and identity was confirmed. Patient was prepped and draped in the usual sterile fashion. Ultrasound survey of the right inguinal region was performed with images stored and sent to PACs. 11 blade scalpel was used to make a small incision. Blunt dissection was performed with US guidance. A micropuncture needle was used access the right common femoral artery under ultrasound. With excellent arterial blood flow  returned, an .018 micro wire was passed through the needle, observed to enter the abdominal aorta under fluoroscopy. The needle was removed, and a micropuncture sheath was placed over the wire. The inner dilator and wire were removed, and an 035 wire was advanced under fluoroscopy into the abdominal aorta. The sheath was removed and a 25cm 93F straight vascular sheath was placed. The dilator was removed and the sheath was flushed. Limited angiogram was performed. Sheath was attached to pressurized and heparinized saline bag for constant forward flow. A coaxial system was then advanced over the 035 wire. This included a 110 cm Zoom 088 catheter with coaxial 125cm Berenstein diagnostic catheter. This was advanced to the proximal descending thoracic aorta. Wire was then removed. Double flush of the catheter was performed. Catheter was then used to select the innominate artery and the common carotid artery. The catheter combination was then navigated into the cervical ICA segment. The Berenstein catheter and the Glidewire were  removed. Angiogram was performed. Road map function was used once the occluded vessel was identified. Copious back flush was performed and the 088 catheter was attached to heparinized and pressurized saline bag for forward flow. A second coaxial system was then advanced through the balloon catheter, which included the selected intermediate catheter, microcatheter, and microwire. In this scenario, the set up included a 137 cm 071 Zoom catheter, a Trevo Provue18 microcatheter, and 014 synchro soft wire. This system was advanced through the guide catheter under the road-map function, with adequate back-flush at the rotating hemostatic valve at that back end of the balloon guide. Microcatheter and the intermediate catheter system were advanced through the terminal ICA and MCA to the level of the occlusion. The micro wire in the microcatheter were then removed. These 071 Zoom catheter was then advanced to the face of the occlusion for the first pass attempt, adapt technique. Catheter was withdrawn while at the same time the 088 distal access catheter was advanced through the carotid siphon to the terminus. Angiogram confirmed persistent occlusion. The 071 catheter was then readvanced through the distal access catheter with a coaxial microwire and microcatheter. Microcatheter and the microwire were then gently navigated through the occlusion into angular branch of the MCA. Microcatheter was advanced through the occlusion. Wire was removed. Blood was then aspirated through the hub of the microcatheter, and a gentle contrast injection was performed confirming intraluminal position. A rotating hemostatic valve was then attached to the back end of the microcatheter, and a pressurized and heparinized saline bag was attached to the catheter. 4 x 40 solitaire device was then selected. Back flush was achieved at the rotating hemostatic valve, and then the device was gently advanced through the microcatheter to the distal end. The  retriever was then unsheathed by withdrawing the microcatheter under fluoroscopy. Once the retriever was completely unsheathed, the microcatheter was carefully stripped from the delivery device. Control angiogram was performed from the intermediate catheter. A 3 minute time interval was observed. Solitaire stent was then withdrawn to the 071 catheter under fluoroscopy. Once the retriever was "corked" within the tip of the intermediate catheter, both were removed from the system. Free aspiration was confirmed at the hub of the 088 distal access catheter, with free blood return confirmed. Control angiogram was performed. Partial occlusion remained with TICI 2b flow at this point. We then advanced the zoom catheter with the microwire and microcatheter combination for a third pass attempt, using the adapt technique. 071 catheter was gently advanced to the face of the  clot, and aspiration was performed. Once the catheter was withdrawn into the base 088 distal guide catheter, aspiration was performed at the hub of the 088 catheter. Angiogram was performed. Partial occlusion remained with TICI 2b flow at this point. We then proceeded with a fourth pass. The 071 catheter was then readvanced through the distal access catheter with a coaxial microwire and microcatheter. Microcatheter and the microwire were then gently navigated to the face of the occlusion. The synchro soft wire would not advance through the occlusion. A headliner J wire 012 was then used through the microcatheter, successful in passing through the occlusion into the angular branch. Microcatheter was advanced through the occlusion. Wire was removed. Blood was then aspirated through the hub of the microcatheter, and a gentle contrast injection was performed confirming intraluminal position. A rotating hemostatic valve was then attached to the back end of the microcatheter, and a pressurized and heparinized saline bag was attached to the catheter. 5 mm by 37 mm  EMBO trapped was then selected. Back flush was achieved at the rotating hemostatic valve, and then the device was gently advanced through the microcatheter to the distal end. The retriever was then unsheathed by withdrawing the microcatheter under fluoroscopy. Once the retriever was completely unsheathed, the microcatheter was carefully stripped from the delivery device. Control angiogram was performed from the intermediate catheter. This control angiogram confirmed flow through the EMBO trap, with a lesion at the site of the occlusion, representing native intracranial atherosclerosis. While the stent tree vert remained open for proximally 5 minutes, we had the anesthesia team pass and OG tube. We administered 650 mg of aspirin. Estimation of the diameter length of the lesion were performed. We selected a drug-eluting balloon mounted stent, 2.25 mm x 15 mm. After approximately 5 minutes, the stent tree vert was removed. Angiogram was performed confirming TICI TB flow, relatively unchanged. We then navigated the microcatheter and the microwire/synchro wire back through the 088 distal access catheter. This combination was successful in crossing the stenosis/lesion in the distal M1. The catheter micro wire were placed into the angular branch and the wire was removed. Gentle injection confirmed location. A transcend wire was then placed through the microcatheter and the microcatheter was removed. A baseline angiogram was performed. We then deployed a balloon mounted drug-eluting stent. We selected a Onyx 2.53mm x 35mm stent. This was deployed with 7 atmosphere inflation. Balloon was deflated and withdrawn. Repeat angiogram was performed. TICI 3 was achieved. Wires and catheters were removed. The distal access catheter was withdrawn to the cervical ICA and repeat angiogram was performed of the cervical segment. The 088 catheter was then withdrawn. The skin at the puncture site was then cleaned with Chlorhexidine. The 8  French sheath was removed and an 55F angioseal was deployed. Flat panel CT was performed. Patient tolerated the procedure well and remained hemodynamically stable throughout. No complications were encountered and no significant blood loss encountered. IMPRESSION: Status post ultrasound guided access right common femoral artery for cervical and cerebral angiogram, mechanical thrombectomy of right M1 occlusion, and rescue stenting of native intracranial atherosclerotic permissive lesion with 2.25 mm x 15 mm drug-eluting stent, restoring TICI 3 flow. Angio-Seal for hemostasis. Signed, Yvone Neu. Reyne Dumas, RPVI [facility] Radiology PLAN: The patient will remain intubated. ICU status Target systolic blood pressure of 120-140 Right hip straight time 6 hours Frequent neurovascular checks Repeat neurologic imaging with CT and/MRI at the discretion of neurology team Dual anti-platelet therapy. Electronically Signed  By: Gilmer Mor D.O.   On: 10/30/2019 20:58   IR US Guide Vasc Access Right  Result Date: 10/30/2019 INDICATION: 74 year old male with acute stroke, right MCA M1 occlusion EXAM: ULTRASOUND GUIDED ACCESS RIGHT COMMON FEMORAL ARTERY CERVICAL AND CEREBRAL ANGIOGRAM MECHANICAL THROMBECTOMY RIGHT MCA/M1 RESCUE STENT OF RIGHT MCA INTRACRANIAL ATHEROSCLEROSIS ANGIO-SEAL FOR CLOSURE COMPARISON:  CT imaging same day MEDICATIONS: 3 mg intra arterial integral and, 650 mg aspirin OG, 300 mg Plavix OG ANESTHESIA/SEDATION: The anesthesia team was present to provide general endotracheal tube anesthesia and for patient monitoring during the procedure. Intubation was performed in negative pressure [place] in neuro IR holding. Interventional neuro radiology nursing staff was also present. CONTRAST:  170 cc Omnipaque 300 FLUOROSCOPY TIME:  Fluoroscopy Time: 40 minutes 42 seconds (3,316 mGy). COMPLICATIONS: None TECHNIQUE: Informed written consent was obtained from the patient's  family after a thorough discussion of the procedural risks, benefits and alternatives. Specific risks discussed include: Bleeding, infection, contrast reaction, kidney injury/failure, need for further procedure/surgery, arterial injury or dissection, embolization to new territory, intracranial hemorrhage (10-15% risk), neurologic deterioration, cardiopulmonary collapse, death. All questions were addressed. Maximal Sterile Barrier Technique was utilized including during the procedure including caps, mask, sterile gowns, sterile gloves, sterile drape, hand hygiene and skin antiseptic. A timeout was performed prior to the initiation of the procedure. The anesthesia team was present to provide general endotracheal tube anesthesia and for patient monitoring during the procedure. Interventional neuro radiology nursing staff was also present. FINDINGS: Initial Findings: Right internal carotid artery: Tortuosity of the cervical segment. Vertical and petrous segment patent with normal course caliber contour. Cavernous segment patent. Clinoid segment patent. Antegrade flow of the ophthalmic artery. Ophthalmic segment patent. Terminus patent. Right MCA: Proximal M1 segment is patent. There is a tapered cuff-off of the mid segment of the M1, in the region the mid orbit. Proximal temporal branch remains patent. TICI 0 flow through the M1 segment. There late filling with collaterals via the right anterior cerebral artery and the right posterior cerebral artery via leptomeningeal collateral vessels. Patent anterior communicating artery with the temporal region supplied by the PCA. Right ACA: A 1 segment patent. A 2 segment perfuses the right territory. Patent anterior communicating artery with significant cross-filling of the left anterior cerebral artery. Completion Findings: During the mechanical thrombectomy, the initial 2 passes resulted in TICI 2B flow, with partial filling of greater than 50% of the right-sided hemispheric  MCA branches. The cortical branches after the first 2 passes are filling both from leptomeningeal collaterals as well as through the attenuated M1 segment. The final pass with the EMBO trapped device uncovered atherosclerotic plaque at the distal M1 segment. This atherosclerotic plaque was then treated with drug-eluting balloon mounted stent. Right MCA: TICI 3 flow was restored through the right hemisphere. The stent was successful in restoring flow through the disease segment of the M1 Right common carotid artery:  Normal course caliber and contour. Right external carotid artery: Patent with antegrade flow. Flat panel CT demonstrates minimal staining within the supra ganglionic right MCA territory with no subarachnoid hemorrhage or large parenchymal hemorrhage. PROCEDURE: Patient was brought to the [facility] suite, and identity was confirmed. Patient was prepped and draped in the usual sterile fashion. Ultrasound survey of the right inguinal region was performed with images stored and sent to PACs. 11 blade scalpel was used to make a small incision. Blunt dissection was performed with US guidance. A micropuncture needle was used access the right common femoral artery under ultrasound. With  excellent arterial blood flow returned, an .018 micro wire was passed through the needle, observed to enter the abdominal aorta under fluoroscopy. The needle was removed, and a micropuncture sheath was placed over the wire. The inner dilator and wire were removed, and an 035 wire was advanced under fluoroscopy into the abdominal aorta. The sheath was removed and a 25cm 21F straight vascular sheath was placed. The dilator was removed and the sheath was flushed. Limited angiogram was performed. Sheath was attached to pressurized and heparinized saline bag for constant forward flow. A coaxial system was then advanced over the 035 wire. This included a 110 cm Zoom 088 catheter with coaxial 125cm Berenstein diagnostic catheter. This was  advanced to the proximal descending thoracic aorta. Wire was then removed. Double flush of the catheter was performed. Catheter was then used to select the innominate artery and the common carotid artery. The catheter combination was then navigated into the cervical ICA segment. The Berenstein catheter and the Glidewire were removed. Angiogram was performed. Road map function was used once the occluded vessel was identified. Copious back flush was performed and the 088 catheter was attached to heparinized and pressurized saline bag for forward flow. A second coaxial system was then advanced through the balloon catheter, which included the selected intermediate catheter, microcatheter, and microwire. In this scenario, the set up included a 137 cm 071 Zoom catheter, a Trevo Provue18 microcatheter, and 014 synchro soft wire. This system was advanced through the guide catheter under the road-map function, with adequate back-flush at the rotating hemostatic valve at that back end of the balloon guide. Microcatheter and the intermediate catheter system were advanced through the terminal ICA and MCA to the level of the occlusion. The micro wire in the microcatheter were then removed. These 071 Zoom catheter was then advanced to the face of the occlusion for the first pass attempt, adapt technique. Catheter was withdrawn while at the same time the 088 distal access catheter was advanced through the carotid siphon to the terminus. Angiogram confirmed persistent occlusion. The 071 catheter was then readvanced through the distal access catheter with a coaxial microwire and microcatheter. Microcatheter and the microwire were then gently navigated through the occlusion into angular branch of the MCA. Microcatheter was advanced through the occlusion. Wire was removed. Blood was then aspirated through the hub of the microcatheter, and a gentle contrast injection was performed confirming intraluminal position. A rotating hemostatic  valve was then attached to the back end of the microcatheter, and a pressurized and heparinized saline bag was attached to the catheter. 4 x 40 solitaire device was then selected. Back flush was achieved at the rotating hemostatic valve, and then the device was gently advanced through the microcatheter to the distal end. The retriever was then unsheathed by withdrawing the microcatheter under fluoroscopy. Once the retriever was completely unsheathed, the microcatheter was carefully stripped from the delivery device. Control angiogram was performed from the intermediate catheter. A 3 minute time interval was observed. Solitaire stent was then withdrawn to the 071 catheter under fluoroscopy. Once the retriever was "corked" within the tip of the intermediate catheter, both were removed from the system. Free aspiration was confirmed at the hub of the 088 distal access catheter, with free blood return confirmed. Control angiogram was performed. Partial occlusion remained with TICI 2b flow at this point. We then advanced the zoom catheter with the microwire and microcatheter combination for a third pass attempt, using the adapt technique. 071 catheter was gently advanced to  the face of the clot, and aspiration was performed. Once the catheter was withdrawn into the base 088 distal guide catheter, aspiration was performed at the hub of the 088 catheter. Angiogram was performed. Partial occlusion remained with TICI 2b flow at this point. We then proceeded with a fourth pass. The 071 catheter was then readvanced through the distal access catheter with a coaxial microwire and microcatheter. Microcatheter and the microwire were then gently navigated to the face of the occlusion. The synchro soft wire would not advance through the occlusion. A headliner J wire 012 was then used through the microcatheter, successful in passing through the occlusion into the angular branch. Microcatheter was advanced through the occlusion. Wire  was removed. Blood was then aspirated through the hub of the microcatheter, and a gentle contrast injection was performed confirming intraluminal position. A rotating hemostatic valve was then attached to the back end of the microcatheter, and a pressurized and heparinized saline bag was attached to the catheter. 5 mm by 37 mm EMBO trapped was then selected. Back flush was achieved at the rotating hemostatic valve, and then the device was gently advanced through the microcatheter to the distal end. The retriever was then unsheathed by withdrawing the microcatheter under fluoroscopy. Once the retriever was completely unsheathed, the microcatheter was carefully stripped from the delivery device. Control angiogram was performed from the intermediate catheter. This control angiogram confirmed flow through the EMBO trap, with a lesion at the site of the occlusion, representing native intracranial atherosclerosis. While the stent tree vert remained open for proximally 5 minutes, we had the anesthesia team pass and OG tube. We administered 650 mg of aspirin. Estimation of the diameter length of the lesion were performed. We selected a drug-eluting balloon mounted stent, 2.25 mm x 15 mm. After approximately 5 minutes, the stent tree vert was removed. Angiogram was performed confirming TICI TB flow, relatively unchanged. We then navigated the microcatheter and the microwire/synchro wire back through the 088 distal access catheter. This combination was successful in crossing the stenosis/lesion in the distal M1. The catheter micro wire were placed into the angular branch and the wire was removed. Gentle injection confirmed location. A transcend wire was then placed through the microcatheter and the microcatheter was removed. A baseline angiogram was performed. We then deployed a balloon mounted drug-eluting stent. We selected a Onyx 2.51mm x 15mm stent. This was deployed with 7 atmosphere inflation. Balloon was deflated and  withdrawn. Repeat angiogram was performed. TICI 3 was achieved. Wires and catheters were removed. The distal access catheter was withdrawn to the cervical ICA and repeat angiogram was performed of the cervical segment. The 088 catheter was then withdrawn. The skin at the puncture site was then cleaned with Chlorhexidine. The 8 French sheath was removed and an 26F angioseal was deployed. Flat panel CT was performed. Patient tolerated the procedure well and remained hemodynamically stable throughout. No complications were encountered and no significant blood loss encountered. IMPRESSION: Status post ultrasound guided access right common femoral artery for cervical and cerebral angiogram, mechanical thrombectomy of right M1 occlusion, and rescue stenting of native intracranial atherosclerotic permissive lesion with 2.25 mm x 15 mm drug-eluting stent, restoring TICI 3 flow. Angio-Seal for hemostasis. Signed, Yvone Neu. Reyne Dumas, RPVI [facility] Radiology PLAN: The patient will remain intubated. ICU status Target systolic blood pressure of 120-140 Right hip straight time 6 hours Frequent neurovascular checks Repeat neurologic imaging with CT and/MRI at the discretion of neurology team Dual  anti-platelet therapy. Electronically Signed   By: Gilmer Mor D.O.   On: 10/30/2019 20:58   DG CHEST PORT 1 VIEW  Result Date: 10/31/2019 CLINICAL DATA:  CVA. EXAM: PORTABLE CHEST 1 VIEW COMPARISON:  None. FINDINGS: The endotracheal tube is 5.5 cm above the carina. The NG tube is coursing down the esophagus and into the stomach. The cardiac silhouette, mediastinal and hilar contours are within normal limits given the AP projection and portable technique. No acute pulmonary findings. No pleural effusions or pneumothorax. The bony thorax is intact. IMPRESSION: No acute cardiopulmonary findings. Electronically Signed   By: Rudie Meyer M.D.   On: 10/31/2019 06:35   DG Swallowing  Func-Speech Pathology  Result Date: 11/02/2019 Objective Swallowing Evaluation: Type of Study: MBS-Modified Barium Swallow Study  Patient Details Name: Omero Kowal MRN: [id number] Date of Birth: 1945-09-11 Today's Date: 11/02/2019 Time: SLP Start Time (ACUTE ONLY): 1300 -SLP Stop Time (ACUTE ONLY): 1330 SLP Time Calculation (min) (ACUTE ONLY): 30 min Past Medical History: Past Medical History: Diagnosis Date . Stroke [facility])  Past Surgical History: Past Surgical History: Procedure Laterality Date . ABDOMINAL SURGERY   . IR CT HEAD LTD  10/30/2019 . IR INTRA CRAN STENT  10/30/2019 . IR PERCUTANEOUS ART THROMBECTOMY/INFUSION INTRACRANIAL INC DIAG ANGIO  10/30/2019 . IR US GUIDE VASC ACCESS RIGHT  10/30/2019 . RADIOLOGY WITH ANESTHESIA N/A 10/30/2019  Procedure: IR WITH ANESTHESIA;  Surgeon: Gilmer Mor, DO;  Location: [facility] OR;  Service: Radiology;  Laterality: N/A; HPI: 74 y.o. male past medical history of mild residual right hemiparesis from a left cerebral hemispheric stroke  Patient presented to [facility] for confusion/aphasia. Pt with aphasia, R gaze deviation and L weakness with possible neglect. CTA head/neck demonstrating R MCA occlusion. Pt underwent R MCA stenting on 5/18, intubated until 5/19.  MRI: Scattered small foci of restricted diffusion throughout the right MCA territory with a cluster at the temporal occipital region, consistent with acute/subacute infarcts. Remote infarcts in the bilateral corona radiata, bilateral basal ganglia and pons.  Subjective: Alert, cooperative Assessment / Plan / Recommendation CHL IP CLINICAL IMPRESSIONS 11/02/2019 Clinical Impression Pt presents with a moderate oral phase dysphagia and a normal pharyngeal phase with excellent pharyngeal clearance and no penetration/aspiration, even when taxed with mixed solid/liquid consistencies.  Oral phase was marked by significant incoordination and poor bolus incohesion, anterior loss, residue and piecemeal swallowing.  Recommend  advancing diet to dysphagia 2 with thin liquids; supervise to cue for pocketing/residue in oral cavity and anterior spillage.  Pills may be given whole in puree.  Pt is potentially D/Cing to CIR next date.  SLP Visit Diagnosis Dysphagia, oral phase (R13.11) Attention and concentration deficit following -- Frontal lobe and executive function deficit following -- Impact on safety and function --   CHL IP TREATMENT RECOMMENDATION 11/02/2019 Treatment Recommendations Therapy as outlined in treatment plan below   Prognosis 11/02/2019 Prognosis for Safe Diet Advancement Good Barriers to Reach Goals -- Barriers/Prognosis Comment -- CHL IP DIET RECOMMENDATION 11/02/2019 SLP Diet Recommendations Thin liquid;Dysphagia 2 (Fine chop) solids Liquid Administration via Cup;Straw Medication Administration Whole meds with puree Compensations Slow rate;Small sips/bites Postural Changes Remain semi-upright after after feeds/meals (Comment)   CHL IP OTHER RECOMMENDATIONS 11/02/2019 Recommended Consults -- Oral Care Recommendations Oral care BID Other Recommendations Have oral suction available   CHL IP FOLLOW UP RECOMMENDATIONS 11/02/2019 Follow up Recommendations Inpatient Rehab   CHL IP FREQUENCY AND DURATION 11/02/2019 Speech Therapy Frequency (ACUTE ONLY) min 2x/week Treatment Duration 1 week  CHL IP ORAL PHASE 11/02/2019 Oral Phase Impaired Oral - Pudding Teaspoon -- Oral - Pudding Cup -- Oral - Honey Teaspoon -- Oral - Honey Cup -- Oral - Nectar Teaspoon -- Oral - Nectar Cup -- Oral - Nectar Straw -- Oral - Thin Teaspoon -- Oral - Thin Cup -- Oral - Thin Straw Right anterior bolus loss;Impaired mastication;Weak lingual manipulation;Lingual pumping;Right pocketing in lateral sulci;Pocketing in anterior sulcus;Piecemeal swallowing;Delayed oral transit;Decreased bolus cohesion Oral - Puree Impaired mastication;Weak lingual manipulation;Lingual pumping;Right pocketing in lateral sulci;Pocketing in anterior sulcus;Piecemeal  swallowing;Delayed oral transit;Decreased bolus cohesion Oral - Mech Soft -- Oral - Regular Impaired mastication;Weak lingual manipulation;Lingual pumping;Right pocketing in lateral sulci;Pocketing in anterior sulcus;Piecemeal swallowing;Delayed oral transit;Decreased bolus cohesion Oral - Multi-Consistency -- Oral - Pill -- Oral Phase - Comment --  CHL IP PHARYNGEAL PHASE 11/02/2019 Pharyngeal Phase WFL Pharyngeal- Pudding Teaspoon -- Pharyngeal -- Pharyngeal- Pudding Cup -- Pharyngeal -- Pharyngeal- Honey Teaspoon -- Pharyngeal -- Pharyngeal- Honey Cup -- Pharyngeal -- Pharyngeal- Nectar Teaspoon -- Pharyngeal -- Pharyngeal- Nectar Cup -- Pharyngeal -- Pharyngeal- Nectar Straw -- Pharyngeal -- Pharyngeal- Thin Teaspoon -- Pharyngeal -- Pharyngeal- Thin Cup -- Pharyngeal -- Pharyngeal- Thin Straw -- Pharyngeal -- Pharyngeal- Puree -- Pharyngeal -- Pharyngeal- Mechanical Soft -- Pharyngeal -- Pharyngeal- Regular -- Pharyngeal -- Pharyngeal- Multi-consistency -- Pharyngeal -- Pharyngeal- Pill -- Pharyngeal -- Pharyngeal Comment --  No flowsheet data found. Blenda Mounts Laurice 11/02/2019, 4:32 PM              EEG adult  Result Date: 10/31/2019 Charlsie Quest, MD     10/31/2019  5:30 PM Patient Name: Rudie Sermons MRN: [id number] Epilepsy Attending: Charlsie Quest Referring Physician/Provider: Dr Marvel Plan Date: 10/31/2019 Duration: 23.40 mins Patient history: 74 y.o. male with history of L MCA stroke with resultant R sided weakness and HTN, presented with transient recurrent episodes of aphasia and automatisms of R arm with increased tone in all extremities. EEG to evaluate for seizure. Level of alertness: Awake AEDs during EEG study: Propofol Technical aspects: This EEG study was done with scalp electrodes positioned according to the 10-20 International system of electrode placement. Electrical activity was acquired at a sampling rate of 500Hz  and reviewed with a high frequency filter of 70Hz  and a low  frequency filter of 1Hz . EEG data were recorded continuously and digitally stored. Description:  No clear posterior dominant rhythm was seen. EEG showed continuous generalized 5-8 Hz theta-alpha activity.  Hyperventilation and photic stimulation were not performed.   ABNORMALITY -Continuous slow, generalized IMPRESSION: This study is suggestive of mild diffuse encephalopathy, nonspecific etiology. No seizures or epileptiform discharges were seen throughout the recording.   ECHOCARDIOGRAM COMPLETE  Result Date: 11/01/2019    ECHOCARDIOGRAM REPORT   Patient Name:   JOVANN LUSE Date of Exam: 11/01/2019 Medical Rec #:  11/03/2019     Height:       70.0 in Accession #:    Heide Guile    Weight:       216.3 lb Date of Birth:  [birth year]      BSA:          2.158 m Patient Age:    74 years      BP:           134/65 mmHg Patient Gender: M             HR:           74 bpm. Exam Location:  Inpatient Procedure: 2D Echo,  Cardiac Doppler and Color Doppler Indications:    Stroke 434.91 / I163.9  History:        Patient has no prior history of Echocardiogram examinations.                 Risk Factors:Former Smoker.  Sonographer:    Elmarie Shiley Dance Referring Phys: [id number] DAVID R SMITH IMPRESSIONS  1. Left ventricular ejection fraction, by estimation, is 60 to 65%. The left ventricle has normal function. The left ventricle has no regional wall motion abnormalities. Left ventricular diastolic parameters are consistent with Grade I diastolic dysfunction (impaired relaxation).  2. Right ventricular systolic function is normal. The right ventricular size is normal. Tricuspid regurgitation signal is inadequate for assessing PA pressure.  3. The mitral valve is normal in structure. No evidence of mitral valve regurgitation. No evidence of mitral stenosis.  4. The aortic valve is normal in structure. Aortic valve regurgitation is not visualized. No aortic stenosis is present.  5. Aortic dilatation noted. There is mild  dilatation of the aortic root measuring 42 mm.  6. The inferior vena cava is normal in size with greater than 50% respiratory variability, suggesting right atrial pressure of 3 mmHg. Conclusion(s)/Recommendation(s): No intracardiac source of embolism detected on this transthoracic study. A transesophageal echocardiogram is recommended to exclude cardiac source of embolism if clinically indicated. FINDINGS  Left Ventricle: Left ventricular ejection fraction, by estimation, is 60 to 65%. The left ventricle has normal function. The left ventricle has no regional wall motion abnormalities. The left ventricular internal cavity size was normal in size. There is  no left ventricular hypertrophy. Left ventricular diastolic parameters are consistent with Grade I diastolic dysfunction (impaired relaxation). Right Ventricle: The right ventricular size is normal. No increase in right ventricular wall thickness. Right ventricular systolic function is normal. Tricuspid regurgitation signal is inadequate for assessing PA pressure. Left Atrium: Left atrial size was normal in size. Right Atrium: Right atrial size was normal in size. Pericardium: There is no evidence of pericardial effusion. Mitral Valve: The mitral valve is normal in structure. Normal mobility of the mitral valve leaflets. No evidence of mitral valve regurgitation. No evidence of mitral valve stenosis. Tricuspid Valve: The tricuspid valve is normal in structure. Tricuspid valve regurgitation is not demonstrated. No evidence of tricuspid stenosis. Aortic Valve: The aortic valve is normal in structure. Aortic valve regurgitation is not visualized. No aortic stenosis is present. Pulmonic Valve: The pulmonic valve was normal in structure. Pulmonic valve regurgitation is not visualized. No evidence of pulmonic stenosis. Aorta: Aortic dilatation noted. There is mild dilatation of the aortic root measuring 42 mm. Venous: The inferior vena cava is normal in size with  greater than 50% respiratory variability, suggesting right atrial pressure of 3 mmHg. IAS/Shunts: No atrial level shunt detected by color flow Doppler.  LEFT VENTRICLE PLAX 2D LVIDd:         4.90 cm  Diastology LVIDs:         3.40 cm  LV e' lateral:   8.59 cm/s LV PW:         1.10 cm  LV E/e' lateral: 10.9 LV IVS:        1.00 cm  LV e' medial:    6.42 cm/s LVOT diam:     2.00 cm  LV E/e' medial:  14.6 LV SV:         73 LV SV Index:   34 LVOT Area:     3.14 cm  RIGHT VENTRICLE  IVC RV Basal diam:  2.10 cm     IVC diam: 1.50 cm RV S prime:     12.10 cm/s TAPSE (M-mode): 1.8 cm LEFT ATRIUM             Index LA diam:        3.50 cm 1.62 cm/m LA Vol (A2C):   43.6 ml 20.21 ml/m LA Vol (A4C):   22.2 ml 10.26 ml/m LA Biplane Vol: 37.3 ml 17.29 ml/m  AORTIC VALVE LVOT Vmax:   116.00 cm/s LVOT Vmean:  67.200 cm/s LVOT VTI:    0.233 m  AORTA Ao Root diam: 4.20 cm Ao Asc diam:  3.50 cm MITRAL VALVE MV Area (PHT): 3.31 cm     SHUNTS MV Decel Time: 229 msec     Systemic VTI:  0.23 m MV E velocity: 93.60 cm/s   Systemic Diam: 2.00 cm MV A velocity: 107.00 cm/s MV E/A ratio:  0.87 Weston Brass MD Electronically signed by Weston Brass MD Signature Date/Time: 11/01/2019/3:13:10 PM    Final    IR PERCUTANEOUS ART THROMBECTOMY/INFUSION INTRACRANIAL INC DIAG ANGIO  Result Date: 10/30/2019 INDICATION: 74 year old male with acute stroke, right MCA M1 occlusion EXAM: ULTRASOUND GUIDED ACCESS RIGHT COMMON FEMORAL ARTERY CERVICAL AND CEREBRAL ANGIOGRAM MECHANICAL THROMBECTOMY RIGHT MCA/M1 RESCUE STENT OF RIGHT MCA INTRACRANIAL ATHEROSCLEROSIS ANGIO-SEAL FOR CLOSURE COMPARISON:  CT imaging same day MEDICATIONS: 3 mg intra arterial integral and, 650 mg aspirin OG, 300 mg Plavix OG ANESTHESIA/SEDATION: The anesthesia team was present to provide general endotracheal tube anesthesia and for patient monitoring during the procedure. Intubation was performed in negative pressure [place] in neuro IR holding. Interventional neuro  radiology nursing staff was also present. CONTRAST:  170 cc Omnipaque 300 FLUOROSCOPY TIME:  Fluoroscopy Time: 40 minutes 42 seconds (3,316 mGy). COMPLICATIONS: None TECHNIQUE: Informed written consent was obtained from the patient's family after a thorough discussion of the procedural risks, benefits and alternatives. Specific risks discussed include: Bleeding, infection, contrast reaction, kidney injury/failure, need for further procedure/surgery, arterial injury or dissection, embolization to new territory, intracranial hemorrhage (10-15% risk), neurologic deterioration, cardiopulmonary collapse, death. All questions were addressed. Maximal Sterile Barrier Technique was utilized including during the procedure including caps, mask, sterile gowns, sterile gloves, sterile drape, hand hygiene and skin antiseptic. A timeout was performed prior to the initiation of the procedure. The anesthesia team was present to provide general endotracheal tube anesthesia and for patient monitoring during the procedure. Interventional neuro radiology nursing staff was also present. FINDINGS: Initial Findings: Right internal carotid artery: Tortuosity of the cervical segment. Vertical and petrous segment patent with normal course caliber contour. Cavernous segment patent. Clinoid segment patent. Antegrade flow of the ophthalmic artery. Ophthalmic segment patent. Terminus patent. Right MCA: Proximal M1 segment is patent. There is a tapered cuff-off of the mid segment of the M1, in the region the mid orbit. Proximal temporal branch remains patent. TICI 0 flow through the M1 segment. There late filling with collaterals via the right anterior cerebral artery and the right posterior cerebral artery via leptomeningeal collateral vessels. Patent anterior communicating artery with the temporal region supplied by the PCA. Right ACA: A 1 segment patent. A 2 segment perfuses the right territory. Patent anterior communicating artery with  significant cross-filling of the left anterior cerebral artery. Completion Findings: During the mechanical thrombectomy, the initial 2 passes resulted in TICI 2B flow, with partial filling of greater than 50% of the right-sided hemispheric MCA branches. The cortical branches after the first 2 passes are filling  both from leptomeningeal collaterals as well as through the attenuated M1 segment. The final pass with the EMBO trapped device uncovered atherosclerotic plaque at the distal M1 segment. This atherosclerotic plaque was then treated with drug-eluting balloon mounted stent. Right MCA: TICI 3 flow was restored through the right hemisphere. The stent was successful in restoring flow through the disease segment of the M1 Right common carotid artery:  Normal course caliber and contour. Right external carotid artery: Patent with antegrade flow. Flat panel CT demonstrates minimal staining within the supra ganglionic right MCA territory with no subarachnoid hemorrhage or large parenchymal hemorrhage. PROCEDURE: Patient was brought to the [facility] suite, and identity was confirmed. Patient was prepped and draped in the usual sterile fashion. Ultrasound survey of the right inguinal region was performed with images stored and sent to PACs. 11 blade scalpel was used to make a small incision. Blunt dissection was performed with US guidance. A micropuncture needle was used access the right common femoral artery under ultrasound. With excellent arterial blood flow returned, an .018 micro wire was passed through the needle, observed to enter the abdominal aorta under fluoroscopy. The needle was removed, and a micropuncture sheath was placed over the wire. The inner dilator and wire were removed, and an 035 wire was advanced under fluoroscopy into the abdominal aorta. The sheath was removed and a 25cm 59F straight vascular sheath was placed. The dilator was removed and the sheath was flushed. Limited angiogram was performed. Sheath  was attached to pressurized and heparinized saline bag for constant forward flow. A coaxial system was then advanced over the 035 wire. This included a 110 cm Zoom 088 catheter with coaxial 125cm Berenstein diagnostic catheter. This was advanced to the proximal descending thoracic aorta. Wire was then removed. Double flush of the catheter was performed. Catheter was then used to select the innominate artery and the common carotid artery. The catheter combination was then navigated into the cervical ICA segment. The Berenstein catheter and the Glidewire were removed. Angiogram was performed. Road map function was used once the occluded vessel was identified. Copious back flush was performed and the 088 catheter was attached to heparinized and pressurized saline bag for forward flow. A second coaxial system was then advanced through the balloon catheter, which included the selected intermediate catheter, microcatheter, and microwire. In this scenario, the set up included a 137 cm 071 Zoom catheter, a Trevo Provue18 microcatheter, and 014 synchro soft wire. This system was advanced through the guide catheter under the road-map function, with adequate back-flush at the rotating hemostatic valve at that back end of the balloon guide. Microcatheter and the intermediate catheter system were advanced through the terminal ICA and MCA to the level of the occlusion. The micro wire in the microcatheter were then removed. These 071 Zoom catheter was then advanced to the face of the occlusion for the first pass attempt, adapt technique. Catheter was withdrawn while at the same time the 088 distal access catheter was advanced through the carotid siphon to the terminus. Angiogram confirmed persistent occlusion. The 071 catheter was then readvanced through the distal access catheter with a coaxial microwire and microcatheter. Microcatheter and the microwire were then gently navigated through the occlusion into angular branch of the  MCA. Microcatheter was advanced through the occlusion. Wire was removed. Blood was then aspirated through the hub of the microcatheter, and a gentle contrast injection was performed confirming intraluminal position. A rotating hemostatic valve was then attached to the back end of the microcatheter,  and a pressurized and heparinized saline bag was attached to the catheter. 4 x 40 solitaire device was then selected. Back flush was achieved at the rotating hemostatic valve, and then the device was gently advanced through the microcatheter to the distal end. The retriever was then unsheathed by withdrawing the microcatheter under fluoroscopy. Once the retriever was completely unsheathed, the microcatheter was carefully stripped from the delivery device. Control angiogram was performed from the intermediate catheter. A 3 minute time interval was observed. Solitaire stent was then withdrawn to the 071 catheter under fluoroscopy. Once the retriever was "corked" within the tip of the intermediate catheter, both were removed from the system. Free aspiration was confirmed at the hub of the 088 distal access catheter, with free blood return confirmed. Control angiogram was performed. Partial occlusion remained with TICI 2b flow at this point. We then advanced the zoom catheter with the microwire and microcatheter combination for a third pass attempt, using the adapt technique. 071 catheter was gently advanced to the face of the clot, and aspiration was performed. Once the catheter was withdrawn into the base 088 distal guide catheter, aspiration was performed at the hub of the 088 catheter. Angiogram was performed. Partial occlusion remained with TICI 2b flow at this point. We then proceeded with a fourth pass. The 071 catheter was then readvanced through the distal access catheter with a coaxial microwire and microcatheter. Microcatheter and the microwire were then gently navigated to the face of the occlusion. The synchro  soft wire would not advance through the occlusion. A headliner J wire 012 was then used through the microcatheter, successful in passing through the occlusion into the angular branch. Microcatheter was advanced through the occlusion. Wire was removed. Blood was then aspirated through the hub of the microcatheter, and a gentle contrast injection was performed confirming intraluminal position. A rotating hemostatic valve was then attached to the back end of the microcatheter, and a pressurized and heparinized saline bag was attached to the catheter. 5 mm by 37 mm EMBO trapped was then selected. Back flush was achieved at the rotating hemostatic valve, and then the device was gently advanced through the microcatheter to the distal end. The retriever was then unsheathed by withdrawing the microcatheter under fluoroscopy. Once the retriever was completely unsheathed, the microcatheter was carefully stripped from the delivery device. Control angiogram was performed from the intermediate catheter. This control angiogram confirmed flow through the EMBO trap, with a lesion at the site of the occlusion, representing native intracranial atherosclerosis. While the stent tree vert remained open for proximally 5 minutes, we had the anesthesia team pass and OG tube. We administered 650 mg of aspirin. Estimation of the diameter length of the lesion were performed. We selected a drug-eluting balloon mounted stent, 2.25 mm x 15 mm. After approximately 5 minutes, the stent tree vert was removed. Angiogram was performed confirming TICI TB flow, relatively unchanged. We then navigated the microcatheter and the microwire/synchro wire back through the 088 distal access catheter. This combination was successful in crossing the stenosis/lesion in the distal M1. The catheter micro wire were placed into the angular branch and the wire was removed. Gentle injection confirmed location. A transcend wire was then placed through the microcatheter  and the microcatheter was removed. A baseline angiogram was performed. We then deployed a balloon mounted drug-eluting stent. We selected a Onyx 2.63mm x 15mm stent. This was deployed with 7 atmosphere inflation. Balloon was deflated and withdrawn. Repeat angiogram was performed. TICI 3 was achieved.  Wires and catheters were removed. The distal access catheter was withdrawn to the cervical ICA and repeat angiogram was performed of the cervical segment. The 088 catheter was then withdrawn. The skin at the puncture site was then cleaned with Chlorhexidine. The 8 French sheath was removed and an 30F angioseal was deployed. Flat panel CT was performed. Patient tolerated the procedure well and remained hemodynamically stable throughout. No complications were encountered and no significant blood loss encountered. IMPRESSION: Status post ultrasound guided access right common femoral artery for cervical and cerebral angiogram, mechanical thrombectomy of right M1 occlusion, and rescue stenting of native intracranial atherosclerotic permissive lesion with 2.25 mm x 15 mm drug-eluting stent, restoring TICI 3 flow. Angio-Seal for hemostasis. Signed, Yvone Neu. Reyne Dumas, RPVI [facility] Radiology PLAN: The patient will remain intubated. ICU status Target systolic blood pressure of 120-140 Right hip straight time 6 hours Frequent neurovascular checks Repeat neurologic imaging with CT and/MRI at the discretion of neurology team Dual anti-platelet therapy. Electronically Signed   By: Gilmer Mor D.O.   On: 10/30/2019 20:58   CT HEAD CODE STROKE WO CONTRAST`  Addendum Date: 10/30/2019   ADDENDUM REPORT: 10/30/2019 15:38 ADDENDUM: Study discussed by telephone with Dr. Chesley Noon on 10/30/2019 at 1532 hours. Electronically Signed   By: Odessa Fleming M.D.   On: 10/30/2019 15:38   Result Date: 10/30/2019 CLINICAL DATA:  Code stroke. 74 year old male last known well 1100 hours. Right  gaze deviation and aphasia. EXAM: CT HEAD WITHOUT CONTRAST TECHNIQUE: Contiguous axial images were obtained from the base of the skull through the vertex without intravenous contrast. COMPARISON:  Head CT 03/21/2018. FINDINGS: Brain: Stable cerebral volume. No midline shift, mass effect, or evidence of intracranial mass lesion. No ventriculomegaly. No acute intracranial hemorrhage identified. Advanced chronic small vessel ischemia in the bilateral corona radiata and deep gray nuclei, with Patchy and confluent bilateral hypodensity more so on the left. Some associated ex vacuo enlargement of the left lateral ventricle. But gray-white matter differentiation appears stable since 2019. No cortically based acute infarct identified. No cortical encephalomalacia identified. Vascular: Mild Calcified atherosclerosis at the skull base. No suspicious intracranial vascular hyperdensity. Skull: Stable, negative. Sinuses/Orbits: Chronic bubbly opacity in the left sphenoid. Other Visualized paranasal sinuses and mastoids are stable and well pneumatized. Other: Rightward gaze deviation. No acute scalp soft tissue finding. ASPECTS [facility] Stroke Program Early CT Score) Total score (0-10 with 10 being normal): 10 (chronic encephalomalacia). IMPRESSION: 1. Advanced chronic small vessel disease appears stable by CT since 2019. 2. No acute cortically based infarct or acute intracranial hemorrhage identified. ASPECTS 10. Electronically Signed: By: Odessa Fleming M.D. On: 10/30/2019 15:25   VAS [place] LOWER EXTREMITY VENOUS (DVT)  Result Date: 11/01/2019  Lower Venous DVTStudy Indications: Stroke.  Comparison Study: no prior Performing Technologist: Blanch Media RVS  Examination Guidelines: A complete evaluation includes B-mode imaging, spectral Doppler, color Doppler, and power Doppler as needed of all accessible portions of each vessel. Bilateral testing is considered an integral part of a complete examination. Limited examinations for  reoccurring indications may be performed as noted. The reflux portion of the exam is performed with the patient in reverse Trendelenburg.  +---------+---------------+---------+-----------+----------+--------------+ RIGHT    CompressibilityPhasicitySpontaneityPropertiesThrombus Aging +---------+---------------+---------+-----------+----------+--------------+ CFV      Full           Yes      Yes                                 +---------+---------------+---------+-----------+----------+--------------+  SFJ      Full                                                        +---------+---------------+---------+-----------+----------+--------------+ FV Prox  Full                                                        +---------+---------------+---------+-----------+----------+--------------+ FV Mid   Full                                                        +---------+---------------+---------+-----------+----------+--------------+ FV DistalFull                                                        +---------+---------------+---------+-----------+----------+--------------+ PFV      Full                                                        +---------+---------------+---------+-----------+----------+--------------+ POP      Full           Yes      Yes                                 +---------+---------------+---------+-----------+----------+--------------+ PTV      Full                                                        +---------+---------------+---------+-----------+----------+--------------+ PERO     Full                                                        +---------+---------------+---------+-----------+----------+--------------+   +---------+---------------+---------+-----------+----------+--------------+ LEFT     CompressibilityPhasicitySpontaneityPropertiesThrombus Aging  +---------+---------------+---------+-----------+----------+--------------+ CFV      Full           Yes      Yes                                 +---------+---------------+---------+-----------+----------+--------------+ SFJ      Full                                                        +---------+---------------+---------+-----------+----------+--------------+  FV Prox  Full                                                        +---------+---------------+---------+-----------+----------+--------------+ FV Mid   Full                                                        +---------+---------------+---------+-----------+----------+--------------+ FV DistalFull                                                        +---------+---------------+---------+-----------+----------+--------------+ PFV      Full                                                        +---------+---------------+---------+-----------+----------+--------------+ POP      Full           Yes      Yes                                 +---------+---------------+---------+-----------+----------+--------------+ PTV      Full                                                        +---------+---------------+---------+-----------+----------+--------------+ PERO     Full                                                        +---------+---------------+---------+-----------+----------+--------------+     Summary: BILATERAL: - No evidence of deep vein thrombosis seen in the lower extremities, bilaterally. -   *See table(s) above for measurements and observations. Electronically signed by Sherald Hess MD on 11/01/2019 at 5:24:02 PM.    Final     Labs:  Basic Metabolic Panel: Recent Labs  Lab 11/13/19 0947 11/15/19 0510  NA 138 135  K 4.8 4.5  CL 104 103  CO2 26 25  GLUCOSE 141* 124*  BUN 26* 20  CREATININE 1.13 0.97  CALCIUM 9.2 9.0    CBC: Recent Labs  Lab 11/13/19 0947   WBC 5.1  HGB 12.9*  HCT 41.5  MCV 87.9  PLT 236    CBG: No results for input(s): GLUCAP in the last 168 hours.  Family history.  Mother with hypertension Father with hypertension.  Denies any diabetes mellitus colon cancer esophageal cancer or rectal cancer  Brief HPI:   Giancarlos Berendt is a 74 y.o. right-handed male with history of CVA with residual right-sided weakness,  morbid obesity with BMI 31.03, tobacco abuse on no prescription medications and reported medical noncompliance.  Per chart review lives with spouse independent prior to admission but rather sedentary.  1 level home 4 steps to entry.  Presented 10/30/2019 to [facility] with aphasia as well as altered mental status left side weakness.  Cranial CT scan showed advanced chronic small vessel disease no changes.  CT angiogram of head and neck distal right M1 MCA occlusion.  No hemodynamically significant stenosis in the neck.  Patient underwent emergent thrombectomy of right MCA M1 occlusion with rescue stenting per interventional radiology.  A follow-up MRI showed scattered foci of restricted diffusion throughout the right MCA territory with a cluster at the temporal occipital region consistent with acute subacute infarction.  Echocardiogram with ejection fraction of 65% grade 1 diastolic dysfunction.  There was report of possible seizure with increased body tone treated with Ativan and Keppra currently maintained on Keppra.  EEG negative for seizure but was suggestive of mild diffuse encephalopathy.  Admission chemistries unremarkable except glucose 131 urine drug screen negative SARS coronavirus negative.  Maintain on aspirin Plavix for 3 to 6 months due to stent placement per neurology services.  Subcutaneous heparin for DVT prophylaxis venous Doppler studies negative.  Maintain on a dysphagia #2 thin liquid diet.  Therapy evaluations completed and patient was admitted for a comprehensive rehab program.   Hospital  Course: Rogen Porte was admitted to rehab 11/03/2019 for inpatient therapies to consist of PT, ST and OT at least three hours five days a week. Past admission physiatrist, therapy team and rehab RN have worked together to provide customized collaborative inpatient rehab.  Pertaining to patient's right MCA scattered and a single punctate right cerebellar infarction with right M1 occlusion underwent M1 thrombectomy.  Patient also with history of left subcortical and pontine infarct with residual right-sided weakness.  Remained on aspirin and Plavix therapy per neurology services and would follow-up with also interventional radiology.  Subcutaneous heparin for DVT prophylaxis venous Doppler studies negative.  Patient with bouts of anxiety suspect pseudobulbar affect placed on trial of Nuedexta with emotional support provided.  His diet was slowly advanced to a mechanical soft.  He remained on Lipitor for hyperlipidemia.  Keppra for seizure prophylaxis 500 mg twice daily EEG negative.  Blood pressure was currently controlled on no antihypertensive medication noted to be somewhat permissive he would need follow-up outpatient with PCP.  There was findings of mildly elevated hemoglobin A1c of 7.4 sliding-scale insulin his blood sugars were overall controlled 107-105- 118 and again would need close follow-up with PCP.  He did have a history of tobacco abuse received counts regards to cessation of nicotine products.  Morbid obesity BMI 28.7 dietary follow-up.  There was some documentation of medical noncompliance again patient received counsel regards to maintaining medical regimen.   Blood pressures were monitored on TID basis and controlled  Diabetes has been monitored with ac/hs CBG checks and SSI was use prn for tighter BS control.   He/ is continent of bowel and bladder.  He/ has made gains during rehab stay and is attending therapies  He/ will continue to receive follow up therapies   after  discharge  Rehab course: During patient's stay in rehab weekly team conferences were held to monitor patient's progress, set goals and discuss barriers to discharge. At admission, patient required moderate assist sit to stand minimal assist ambulate 40 feet rolling walker.  Max assist upper body bathing max is lower body  bathing moderate assist upper body dressing max is lower body dressing  Physical exam.  Blood pressure 140/70 pulse 80 temperature 98 respirations 18 oxygen saturation 98% room air Constitutional.  No acute distress HEENT Head.  Normocephalic and atraumatic Eyes.  Pupils round and reactive to light no discharge without nystagmus Neck.  Supple nontender no JVD without thyromegaly Cardiac regular rate rhythm without any extra sounds or murmur heard Abdomen.  Soft nontender positive bowel sounds without rebound Respiratory effort normal no respiratory distress without wheeze Musculoskeletal.  Normal range of motion Upper extremities biceps 4 -/5 triceps 4 -/5 wrist extension 4/5, grip 4/5, finger abduction 3+/5 bilateral Lower extremities hip flexors 3 -/5 knee extension 3+/5 dorsiflexion on the right 2 -/5 and left 3/5, plantar flexion 4 -/5 Apraxia noted with strength exam.  Patient dysarthric follow commands.  He/  has had improvement in activity tolerance, balance, postural control as well as ability to compensate for deficits. He/ has had improvement in functional use RUE/LUE  and RLE/LLE as well as improvement in awareness.  Patient required supervision bed mobility contact-guard assist sit to stand from low bed surface with verbal cues.  Ambulates to and from therapy gym with contact-guard assist no assistive device right foot up brace and shoes to emphasize increasing right foot clearance.  Practice stair negotiation bilateral rails as per home set up negotiated up and down supervision verbal cues.  He can ambulate up to 150 feet for functional mobility contact-guard.   Completed functional reaching and alternating right and left to challenge core stabilization and shoulder flexion.  Completed sit to stand from edge of bed with contact-guard.  Use rolling walker transfer to bathroom minimal cues for clothing management.  He was able to communicate his needs.  Full family teaching completed plan discharge to home       Disposition: Discharge to home    Diet: Mechanical soft  Special Instructions: No driving smoking or alcohol  Follow-up with PCP in regards to hypertension, diabetes mellitus and overall medical care  Medications at discharge 1.  Tylenol as needed 2.  Aspirin 81 mg p.o. daily 3.  Lipitor 80 mg p.o. daily 4.  Plavix and 5 mg p.o. daily 5.  Nuedexta 20-10 mg 1 capsule p.o. twice daily 6.  Keppra 500 mg p.o. twice daily 7.  Multivitamin daily 8.  Senokot 2 tablets p.o. daily hold for loose stools  30-35 minutes were spent completing discharge summary and discharge planning    Discharge Instructions    Ambulatory referral to Neurology   Complete by: As directed    An appointment is requested in approximately 4 weeks right MCA infarction   Ambulatory referral to Physical Medicine Rehab   Complete by: As directed    Moderate complexity follow-up 1 to 2 weeks right cerebellar infarction      Follow-up Information    Corrie Mckusick, DO Follow up.   Specialties: Interventional Radiology, Radiology Why: Call for appointment Contact information: [address] [place] [address] [phone]        Izora Ribas, MD Follow up.   Specialty: Physical Medicine and Rehabilitation Why: Office to call for appointment Contact information: [address] [place] [address] [phone]           Signed: Lavon Paganini Economy 11/16/2019, 5:10 AM

## 2019-11-14 NOTE — Progress Notes (Signed)
Nutrition Follow-up  DOCUMENTATION CODES:   Not applicable  INTERVENTION:  -Continue Ensure Enlive po BID, each supplement provides 350 kcal and 20 grams of protein -Continue MVI daily -Continue to encourage po intake   NUTRITION DIAGNOSIS:   Increased nutrient needs related to other (see comment)(therapies) as evidenced by estimated needs.  Ongoing  GOAL:   Patient will meet greater than or equal to 90% of their needs  Progressing  MONITOR:   PO intake, Supplement acceptance, Diet advancement, Labs, Weight trends, Skin  REASON FOR ASSESSMENT:   Malnutrition Screening Tool    ASSESSMENT:   74 year old male with PMH of CVA with residual right-sided weakness, tobacco abuse. Presented 10/30/19 with aphasia as well as AMS and left-sided weakness as well as dysarthria. Cranial CT scan showed advanced chronic small vessel disease no changes. CT angiogram of head and neck distal right M1 MCA occlusion. Pt underwent emergent thrombectomy of right MCA M1 occlusion with rescue stenting. A follow-up MRI showed scattered foci of restricted diffusion throughout the right MCA territory with a cluster at the temporal occipital region consistent with acute/subacute infarction. Admitted to CIR on 5/22.  Noted anticipated discharge of 11/16/19.  Pt reports appetite is good and that he typically drinks his Ensures well. Will continue with current plan of care.  PO Intake: 50-100% x last 8 recorded meals (73% average meal intake)  UOP: x24 hours I/O: +758ml since admit  Labs reviewed. Medications reviewed and include: Ensure Enlive BID, MVI, senokot   NUTRITION - FOCUSED PHYSICAL EXAM:    Most Recent Value  Orbital Region  No depletion  Upper Arm Region  No depletion  Thoracic and Lumbar Region  No depletion  Buccal Region  No depletion  Temple Region  Mild depletion  Clavicle Bone Region  Moderate depletion  Clavicle and Acromion Bone Region  No depletion  Scapular Bone  Region  No depletion  Dorsal Hand  No depletion  Patellar Region  No depletion  Anterior Thigh Region  No depletion  Posterior Calf Region  No depletion  Edema (RD Assessment)  Mild  Hair  Reviewed  Eyes  Reviewed  Mouth  Reviewed  Skin  Reviewed  Nails  Reviewed       Diet Order:   Diet Order            DIET DYS 3 Room service appropriate? Yes; Fluid consistency: Thin  Diet effective now              EDUCATION NEEDS:   No education needs have been identified at this time  Skin:  Skin Assessment: Reviewed RN Assessment(MASD to buttocks)  Last BM:  11/06/19 medium type 3  Height:   Ht Readings from Last 1 Encounters:  11/03/19 5\' 10"  (1.778 m)    Weight:   Wt Readings from Last 1 Encounters:  11/05/19 90.7 kg    Ideal Body Weight:  75.5 kg  BMI:  Body mass index is 28.69 kg/m.  Estimated Nutritional Needs:   Kcal:  2000-2200  Protein:  100-115 grams  Fluid:  >/= 2.0 L    11/07/19, MS, RD, LDN RD pager number and weekend/on-call pager number located in [place].

## 2019-11-14 NOTE — Discharge Instructions (Signed)
Inpatient Rehab Discharge Instructions  Jeffery Reilly Discharge date and time: No discharge date for patient encounter.   Activities/Precautions/ Functional Status: Activity: activity as tolerated Diet: diabetic diet Wound Care: none needed Functional status:  ___ No restrictions     ___ Walk up steps independently ___ 24/7 supervision/assistance   ___ Walk up steps with assistance ___ Intermittent supervision/assistance  ___ Bathe/dress independently ___ Walk with walker     _x__ Bathe/dress with assistance ___ Walk Independently    ___ Shower independently ___ Walk with assistance    ___ Shower with assistance ___ No alcohol     ___ Return to work/school ________  COMMUNITY REFERRALS UPON DISCHARGE:    Home Health:   PT     OT     ST                      Agency: [facility] Phone:  [phone] *Please expect follow-up within 2-3 days from discharge to schedule your home visit. If you have not received follow-up be sure to contact the agency directly.*   Medical Equipment/Items Ordered: tub transfer bench (to be shipped to the home as out of stock)                                                 Agency/Supplier: [facility] [id number]  GENERAL COMMUNITY RESOURCES FOR PATIENT/FAMILY: New patient appointment/hosptial follow-up:  Monday, June 14th at 9:45 with Jeffery Reilly [address] [place], [place] [address] [phone] *Please contact the office if this appointment needs to be changed*   Special Instructions: No driving smoking or alcohol  Continue aspirin and Plavix for 3 to 6 months is based on neurology recommendations STROKE/TIA DISCHARGE INSTRUCTIONS SMOKING Cigarette smoking nearly doubles your risk of having a stroke & is the single most alterable risk factor  If you smoke or have smoked in the last 12 months, you are advised to quit smoking for your health.  Most of the excess cardiovascular risk related to smoking disappears within a year of  stopping.  Ask you doctor about anti-smoking medications  [place] Quit Line: 1-800-QUIT NOW  Free Smoking Cessation Classes [phone]  CHOLESTEROL Know your levels; limit fat & cholesterol in your diet  Lipid Panel     Component Value Date/Time   CHOL 249 (H) 10/31/2019 0551   TRIG 240 (H) 11/01/2019 0500   HDL 43 10/31/2019 0551   CHOLHDL 5.8 10/31/2019 0551   VLDL 63 (H) 10/31/2019 0551   LDLCALC 143 (H) 10/31/2019 0551      Many patients benefit from treatment even if their cholesterol is at goal.  Goal: Total Cholesterol (CHOL) less than 160  Goal:  Triglycerides (TRIG) less than 150  Goal:  HDL greater than 40  Goal:  LDL (LDLCALC) less than 100   BLOOD PRESSURE American Stroke Association blood pressure target is less that 120/80 mm/Hg  Your discharge blood pressure is:  BP: (!) 108/59  Monitor your blood pressure  Limit your salt and alcohol intake  Many individuals will require more than one medication for high blood pressure  DIABETES (A1c is a blood sugar average for last 3 months) Goal HGBA1c is under 7% (HBGA1c is blood sugar average for last 3 months)  Diabetes:     Lab Results  Component Value Date   HGBA1C 7.4 (  H) 10/31/2019     Your HGBA1c can be lowered with medications, healthy diet, and exercise.  Check your blood sugar as directed by your physician  Call your physician if you experience unexplained or low blood sugars.  PHYSICAL ACTIVITY/REHABILITATION Goal is 30 minutes at least 4 days per week  Activity: Increase activity slowly, Therapies: Physical Therapy: Home Health Return to work:   Activity decreases your risk of heart attack and stroke and makes your heart stronger.  It helps control your weight and blood pressure; helps you relax and can improve your mood.  Participate in a regular exercise program.  Talk with your doctor about the best form of exercise for you (dancing, walking, swimming, cycling).  DIET/WEIGHT Goal is to  maintain a healthy weight  Your discharge diet is:  Diet Order            DIET DYS 2 Room service appropriate? Yes; Fluid consistency: Thin  Diet effective now              liquids Your height is:  Height: 5\' 10"  (177.8 cm) Your current weight is: Weight: 91.5 kg Your Body Mass Index (BMI) is:  BMI (Calculated): 28.94  Following the type of diet specifically designed for you will help prevent another stroke.  Your goal weight range is:    Your goal Body Mass Index (BMI) is 19-24.  Healthy food habits can help reduce 3 risk factors for stroke:  High cholesterol, hypertension, and excess weight.  RESOURCES Stroke/Support Group:  Call [phone]   STROKE EDUCATION PROVIDED/REVIEWED AND GIVEN TO PATIENT Stroke warning signs and symptoms How to activate emergency medical system (call 911). Medications prescribed at discharge. Need for follow-up after discharge. Personal risk factors for stroke. Pneumonia vaccine given:  Flu vaccine given:  My questions have been answered, the writing is legible, and I understand these instructions.  I will adhere to these goals & educational materials that have been provided to me after my discharge from the hospital.      My questions have been answered and I understand these instructions. I will adhere to these goals and the provided educational materials after my discharge from the hospital.  Patient/Caregiver Signature _______________________________ Date __________  Clinician Signature _______________________________________ Date __________  Please bring this form and your medication list with you to all your follow-up doctor's appointments.

## 2019-11-14 NOTE — Progress Notes (Addendum)
Patient ID: Jeffery Reilly, male   DOB: 30-Jul-1945, 74 y.o.   MRN: [id number]  SW met with pt in room to discuss recommendation for TTB. Pt amenable to having this item. SW left message for pt wife Lynn Ito [phone]) informing on ordering TTB for pt with [facility].   DME order sent to [facility] via [facility].   *TTB to be shipped to the home as out of stock. Pt wife aware.   Loralee Pacas, MSW, [place] Office: [phone] Cell: [phone] Fax: [phone]

## 2019-11-14 NOTE — Progress Notes (Signed)
Speech Language Pathology Daily Session Note  Patient Details  Name: Nyzir Dubois MRN: [id number] Date of Birth: [birth year]  Today's Date: 11/14/2019 SLP Individual Time: 7921-7837 SLP Individual Time Calculation (min): 40 min  Short Term Goals: Week 2: SLP Short Term Goal 1 (Week 2): STG=LTG due to remaining LOS  Skilled Therapeutic Interventions: Skilled treatment session focused on cognitive goals and completion of family education. SLP facilitated session by providing education regarding patient's current swallowing function, diet recommendations and compensatory strategies. Education was also provided regarding patient's current cognitive functioning and strategies to utilize at home to maximize problem solving and overall safety including the use of a pill organizer. Patient organized a BID pill box with overall Min verbal cues and task was utilized to demonstrate appropriate cueing for error awareness. All verbalized understanding and handouts were given to reinforce information.  Patient left upright in the recliner with family present. Continue with current plan of care.      Pain Pain Assessment Pain Scale: 0-10 Pain Score: 0-No pain  Therapy/Group: Individual Therapy  Senora Lacson 11/14/2019, 1:15 PM

## 2019-11-14 NOTE — Progress Notes (Signed)
[place] PHYSICAL MEDICINE & REHABILITATION PROGRESS NOTE   Subjective/Complaints: Slept well last night. No new issues this morning. Just waking up when I came in.  ROS: Patient denies fever, rash, sore throat, blurred vision, nausea, vomiting, diarrhea, cough, shortness of breath or chest pain, joint or back pain, headache     Objective:   No results found. Recent Labs    11/13/19 0947  WBC 5.1  HGB 12.9*  HCT 41.5  PLT 236   Recent Labs    11/13/19 0947  NA 138  K 4.8  CL 104  CO2 26  GLUCOSE 141*  BUN 26*  CREATININE 1.13  CALCIUM 9.2    Intake/Output Summary (Last 24 hours) at 11/14/2019 0739 Last data filed at 11/14/2019 0431 Gross per 24 hour  Intake 960 ml  Output 500 ml  Net 460 ml     Physical Exam: Vital Signs Blood pressure 123/62, pulse (!) 54, temperature 98.2 F (36.8 C), temperature source Oral, resp. rate 18, height 5\' 10"  (1.778 m), weight 90.7 kg, SpO2 98 %.  Physical Exam   Constitutional: No distress . Vital signs reviewed. HEENT: EOMI, oral membranes moist Neck: supple Cardiovascular: RRR without murmur. No JVD    Respiratory/Chest: CTA Bilaterally without wheezes or rales. Normal effort    GI/Abdomen: BS +, non-tender, non-distended Ext: no clubbing, cyanosis, or edema Psych: flat this morning Musc: no swelling or pain Neurological: He is alert and oriented x 3. Remains dysarthric. Sensation intact to light touch x 4 extremities. Ongoing apraxic speech, motor. RUE 4/5 . RLE 3+ to 4/5. LUE and LLE 5/5. Decreased LT RUE and RLE.  Skin: Skin is warm and dry. chronic venous changes BLE   Assessment/Plan: 1. Functional deficits secondary to R MCA stroke which require 3+ hours per day of interdisciplinary therapy in a comprehensive inpatient rehab setting.  Physiatrist is providing close team supervision and 24 hour management of active medical problems listed below.  Physiatrist and rehab team continue to assess barriers to  discharge/monitor patient progress toward functional and medical goals  Care Tool:  Bathing    Body parts bathed by patient: Right arm, Left arm, Chest, Abdomen, Front perineal area, Right upper leg, Left upper leg, Face, Buttocks, Right lower leg, Left lower leg   Body parts bathed by helper: Left lower leg     Bathing assist Assist Level: Minimal Assistance - Patient > 75%     Upper Body Dressing/Undressing Upper body dressing   What is the patient wearing?: Pull over shirt    Upper body assist Assist Level: Supervision/Verbal cueing    Lower Body Dressing/Undressing Lower body dressing      What is the patient wearing?: Incontinence brief, Pants     Lower body assist Assist for lower body dressing: Minimal Assistance - Patient > 75%     Toileting Toileting    Toileting assist Assist for toileting: Maximal Assistance - Patient 25 - 49%     Transfers Chair/bed transfer  Transfers assist     Chair/bed transfer assist level: Contact Guard/Touching assist     Locomotion Ambulation   Ambulation assist      Assist level: Contact Guard/Touching assist Assistive device: No Device Max distance: 150'   Walk 10 feet activity   Assist     Assist level: Contact Guard/Touching assist Assistive device: No Device   Walk 50 feet activity   Assist    Assist level: Contact Guard/Touching assist Assistive device: No Device    Walk  150 feet activity   Assist    Assist level: Contact Guard/Touching assist Assistive device: No Device    Walk 10 feet on uneven surface  activity   Assist Walk 10 feet on uneven surfaces activity did not occur: Safety/medical concerns(decreased balance, motor planning, and R foot clearance)   Assist level: Supervision/Verbal cueing Assistive device: Aeronautical engineer   Type of Wheelchair: Manual    Wheelchair assist level: Supervision/Verbal cueing, Moderate Assistance - Patient 50 -  74% Max wheelchair distance: 150'    Wheelchair 50 feet with 2 turns activity    Assist        Assist Level: Supervision/Verbal cueing   Wheelchair 150 feet activity     Assist      Assist Level: Moderate Assistance - Patient 50 - 74%   Blood pressure 123/62, pulse (!) 54, temperature 98.2 F (36.8 C), temperature source Oral, resp. rate 18, height 5\' 10"  (1.778 m), weight 90.7 kg, SpO2 98 %.  Medical Problem List and Plan: 1.   right MCA scattered and single punctate right cerebellar infarct with right M1 occlusion status post right M1 thrombectomy.   -Pt has history of L subcorical and pontine  with chronic right-sided residual weakness             -patient may  shower             -ELOS/Goals: 11/16/19- Supervision  -Continue CIR PT, OT, SLP  2.  Antithrombotics: -DVT/anticoagulation: continue with sq heparin.  -recent dopplers on 5/20 were clear             -antiplatelet therapy: Aspirin 81 mg daily and Plavix 75 mg daily 3. Pain Management: Tylenol as needed. Well controlled at present 4. Mood: Provide emotional support, has had period of anxiety.              -antipsychotic agents: N/A  -continue trial of nuedexta for pseudobulbar affect (6/1) 5. Neuropsych: This patient is capable of making decisions on his own behalf. 6. Skin/Wound Care: Routine skin checks  -eucerin cream/vaseline for dry skin- switched to Amlactin to help exfoilate 7. Fluids/Electrolytes/Nutrition: encourage appropriate PO  -bun increased, see below 8.  Dysphagia.  Follow-up speech therapy.  Presently on dysphagia #2 thin liquid diet.  -tolerating diet well, needs to take his time 9.  Hypertension.  Lisinopril 40 mg daily decreased to 20mg  on 5/23  -5/27 bp's well controlled, continue same regimen  -6/1, lisinopril now held. bp's still soft   -encourage fluids   -TEDS   -push fluids, recheck bmet in AM Vitals:   11/13/19 1949 11/14/19 0520  BP: 116/85 123/62  Pulse: 64 (!) 54  Resp:  19 18  Temp: 98.4 F (36.9 C) 98.2 F (36.8 C)  SpO2: 100% 98%   10.  Seizure prophylaxis.  Keppra 500 mg twice daily.  EEG negative 11.  Hyperlipidemia.  Lipitor 12.  Morbid obesity?   BMI only 28.7 13.  New findings diabetes mellitus.  Hemoglobin A1c 7.4.  SSI.   CBGs have been reasonably controlled 6/2  -CM diet 14.  Tobacco abuse.  Counseling 15.  Medical noncompliance.  Counseling 16. Constipation   - senokot 2 tabs daily and has sorbitol prn.   -moving bowels currently       LOS: 11 days A FACE TO FACE EVALUATION WAS PERFORMED  Meredith Staggers 11/14/2019, 7:39 AM

## 2019-11-14 NOTE — Progress Notes (Signed)
Physical Therapy Session Note  Patient Details  Name: Jeffery Reilly MRN: [id number] Date of Birth: 11/27/1945  Today's Date: 11/14/2019 PT Individual Time: 0802-0843 PT Individual Time Calculation (min): 41 min   Short Term Goals: Week 2:  PT Short Term Goal 1 (Week 2): =LTGs due to ELOS  Skilled Therapeutic Interventions/Progress Updates:    Patient received up in recliner, pleasant and willing to participate in session today. Able to complete all functional transfers with RW and gait approximately 227f with min guard/RW with cues for R foot clearance and step through technique; very slow gait pace but steady with RW and good R foot clearance noted even without foot up brace. Tolerated riding Nustep for 8 minutes with BLEs only with cues for SPM but only really able to achieve 16-21SPM today. Otherwise worked on fMedical illustratorincluding narrow BOS with head turns and tandem stance in front of recliner. Left up in recliner with all needs met, seat belt alarm active.   Therapy Documentation Precautions:  Precautions Precautions: Fall Precaution Comments: Mild R hemi, baseline Restrictions Weight Bearing Restrictions: No Pain: Pain Assessment Pain Scale: 0-10 Pain Score: 0-No pain    Therapy/Group: Individual Therapy   KWindell Norfolk DPT, PN1   Supplemental Physical Therapist [place]   Pager [phone] Rehab Office [phone]   11/14/2019, 12:15 PM

## 2019-11-14 NOTE — Progress Notes (Signed)
Occupational Therapy Weekly Progress Note  Patient Details  Name: Jeffery Reilly MRN: [id number] Date of Birth: [birth year]  Beginning of progress report period: Nov 04, 2019 End of progress report period: November 14, 2019  Today's Date: 11/14/2019 OT Individual Time: 1000-1045 OT Individual Time Calculation (min): 45 min    Patient has met 3 of 3 short term goals.  Pt continues to make excellent progress toward his OT goals. He currently requires CGA for ADLs overall and for ADL transfers. Family education completed this date. Pt on track for d/c 6/4.   Patient continues to demonstrate the following deficits: muscle weakness, decreased coordination and decreased standing balance and decreased balance strategies and therefore will continue to benefit from skilled OT intervention to enhance overall performance with BADL and Reduce care partner burden.  Patient progressing toward long term goals..  Continue plan of care.  OT Short Term Goals Week 1:  OT Short Term Goal 1 (Week 1): Pt will complete toilet transfers with Min A sit<stand OT Short Term Goal 1 - Progress (Week 1): Met OT Short Term Goal 2 (Week 1): Pt will complete shower transfer with Min A using LRAD OT Short Term Goal 2 - Progress (Week 1): Met OT Short Term Goal 3 (Week 1): Pt will complete 2 grooming tasks while standing at the sink to improve standing balance and standing endurance OT Short Term Goal 3 - Progress (Week 1): Met Week 2:  OT Short Term Goal 1 (Week 2): STG = LTG d/t ELOS  Skilled Therapeutic Interventions/Progress Updates:    Pt received sitting in w/c with no c/o pain, agreeable to session. Family not initially present so pt completed functional mobility with RW to the therapy gym. Pt completed functional reaching with the RUE in standing, using pincer grasp to remove playing cards from velcro board at eye level. Pt required min cueing for RUE use, instead of L. Pt's family arrived to unit and session was moved to  the ADL apt. Education provided re overall pt function with ADLs. Demonstration provided re use of TTB and pt able to return demo with (S). Stressed importance of (S) for pt safety. Pt returned to room and was provided with written instructions re theraputty use as well as other [facility] exercises. Discussed pseudobulbar affect with family, with wife reporting this is somewhat baseline. Pt was left sitting up in the recliner with all needs met, family present.   Therapy Documentation Precautions:  Precautions Precautions: Fall Precaution Comments: Mild R hemi, baseline Restrictions Weight Bearing Restrictions: No   Therapy/Group: Individual Therapy  Curtis Sites 11/14/2019, 7:01 AM

## 2019-11-15 ENCOUNTER — Inpatient Hospital Stay (HOSPITAL_COMMUNITY): Payer: Medicare Other | Admitting: Physical Therapy

## 2019-11-15 ENCOUNTER — Inpatient Hospital Stay (HOSPITAL_COMMUNITY): Payer: Medicare Other

## 2019-11-15 ENCOUNTER — Inpatient Hospital Stay (HOSPITAL_COMMUNITY): Payer: Medicare Other | Admitting: Speech Pathology

## 2019-11-15 LAB — BASIC METABOLIC PANEL
Anion gap: 7 (ref 5–15)
BUN: 20 mg/dL (ref 8–23)
CO2: 25 mmol/L (ref 22–32)
Calcium: 9 mg/dL (ref 8.9–10.3)
Chloride: 103 mmol/L (ref 98–111)
Creatinine, Ser: 0.97 mg/dL (ref 0.61–1.24)
GFR calc Af Amer: >60 mL/min (ref >60)
GFR calc non Af Amer: >60 mL/min (ref >60)
Glucose, Bld: 124 mg/dL — ABNORMAL HIGH (ref 70–99)
Potassium: 4.5 mmol/L (ref 3.5–5.1)
Sodium: 135 mmol/L (ref 135–145)

## 2019-11-15 MED ORDER — ASPIRIN 81 MG PO CHEW: 81.0000 mg | CHEWABLE_TABLET | Freq: Every day | ORAL | Status: AC

## 2019-11-15 MED ORDER — CLOPIDOGREL BISULFATE 75 MG PO TABS: 75.0000 mg | ORAL_TABLET | Freq: Every day | ORAL | 1 refills | Status: AC

## 2019-11-15 MED ORDER — ATORVASTATIN CALCIUM 80 MG PO TABS: 80.0000 mg | ORAL_TABLET | Freq: Every day | ORAL | 0 refills | Status: AC

## 2019-11-15 MED ORDER — ACETAMINOPHEN 325 MG PO TABS: 650.0000 mg | ORAL_TABLET | ORAL | Status: AC | PRN

## 2019-11-15 MED ORDER — LEVETIRACETAM 500 MG PO TABS: 500.0000 mg | ORAL_TABLET | Freq: Two times a day (BID) | ORAL | 0 refills | Status: AC

## 2019-11-15 MED ORDER — LEVETIRACETAM 500 MG PO TABS
500.0000 mg | ORAL_TABLET | Freq: Two times a day (BID) | ORAL | Status: DC
  Administered 2019-11-15 – 2019-11-16 (×3): 500 mg via ORAL
  Filled 2019-11-15 (×3): qty 1

## 2019-11-15 MED ORDER — DEXTROMETHORPHAN-QUINIDINE 20-10 MG PO CAPS: 1.0000 | ORAL_CAPSULE | Freq: Two times a day (BID) | ORAL | 0 refills | Status: DC

## 2019-11-15 MED ORDER — ADULT MULTIVITAMIN W/MINERALS CH: 1.0000 | ORAL_TABLET | Freq: Every day | ORAL | Status: AC

## 2019-11-15 MED FILL — levETIRAcetam 500 MG TABS: 500 | 30 days supply | Qty: 60 | Fill #0

## 2019-11-15 MED FILL — ATORVASTATIN CALCIUM 80 MG: 80 | 30 days supply | Qty: 30 | Fill #0

## 2019-11-15 MED FILL — CLOPIDOGREL 75 MG TABLET: 75 | 30 days supply | Qty: 30 | Fill #0

## 2019-11-15 NOTE — Progress Notes (Signed)
Speech Language Pathology Discharge Summary  Patient Details  Name: Jeffery Reilly MRN: [id number] Date of Birth: 06-Apr-1946  Today's Date: 11/15/2019 SLP Individual Time: 0830-0925 SLP Individual Time Calculation (min): 55 min   Skilled Therapeutic Interventions:  Skilled treatment session focused on cognitive goals. SLP facilitated session by administering the [place]. Patient scored 15/30 points with deficits in short-term memory and attention. However, patient demonstrates improved recall of functional information and increased attention to functional tasks. Patient recalled his swallowing compensatory strategies and speech intelligibility strategies with overall supervision level verbal cues. Patient utilized the strategies to achieve ~80% intelligibility with overall Min verbal cues at the sentence level. Patient left upright in the recliner with alarm on and all needs within reach.   Patient has met 6 of 6 long term goals.  Patient to discharge at overall Supervision;Min level.   Reasons goals not met: N/A   Clinical Impression/Discharge Summary: Patient has made functional gains and has met 6 of 6 LTGs this admission. Currently, patient is consuming Dys. 3 textures with thin liquids (baseline diet) with minimal overt s/s of aspiration and supervision verbal cues needed for use of swallowing compensatory strategies. Patient demonstrates improved speech intelligibility but can be ~75-90% intelligible at the sentence level with Min verbal cues needed for use of speech intelligibility strategies. However, patient has a h/o dysarthria from previous CVA and patient/family reports his speech intelligibility is better than it was prior to admission. Patient also requires overall Min A verbal cues for complex problem solving with novel tasks like medication management but patient's attention and memory appear at baseline. Patient and family education is complete and patient will discharge home with 24  hour supervision from family. Patient would benefit from f/u SLP services to maximize his swallowing, cognitive and speech function in order to reduce caregiver burden.   Care Partner:  Caregiver Able to Provide Assistance: Yes  Type of Caregiver Assistance: Physical;Cognitive  Recommendation:  Home Health SLP;24 hour supervision/assistance  Rationale for SLP Follow Up: Maximize cognitive function and independence;Reduce caregiver burden;Maximize swallowing safety;Maximize functional communication   Equipment: N/A   Reasons for discharge: Discharged from hospital;Treatment goals met   Patient/Family Agrees with Progress Made and Goals Achieved: Yes    Rozalyn Osland 11/15/2019, 6:39 AM

## 2019-11-15 NOTE — Progress Notes (Signed)
Occupational Therapy Discharge Summary  Patient Details  Name: Edras Wilford MRN: [id number] Date of Birth: Oct 24, 1945  Today's Date: 11/15/2019 OT Individual Time: 0700-0800 OT Individual Time Calculation (min): 60 min    Pt received in bed agreeable to OT. Pt requesting to wash up at sink. Pt completes all transfers with supervision at ambulatory level EOB>toilet>recliner at sink. Pt able to complete simulated toileting with no void at supervision level. Bathing completed at standing level for UB-peri/buttocks. Seated for feet/lower legs with S overall. Dressing at overall S level with VC for applying soap to washcloth. Pt grooms in standing at sink with S for clearing throat after completing oral care. Pt eats seated in recliner with improved mastication of food prior to taking second bite requiring less VC compared to last week.    Patient has met 9 of 9 long term goals due to improved activity tolerance, improved balance, postural control, ability to compensate for deficits, functional use of  RIGHT upper and RIGHT lower extremity, improved attention, improved awareness and improved coordination. Patient has made good progress towards goals and improving with motor planning and safety awareness during functional mobility.  Patient to discharge at overall Supervision level.  Patient's care partner is independent to provide the necessary physical and cognitive assistance at discharge after completing family education on 6/2.    Reasons goals not met: n/a  Recommendation:  Patient will benefit from ongoing skilled OT services in home health setting to continue to advance functional skills in the area of BADL.  Equipment: TTB  Reasons for discharge: treatment goals met and discharge from hospital  Patient/family agrees with progress made and goals achieved: Yes  OT Discharge Precautions/Restrictions  Precautions Precautions: Fall Precaution Comments: Mild R hemi,  baseline Restrictions Weight Bearing Restrictions: No General   Vital Signs   Pain Pain Assessment Pain Score: 0-No pain ADL ADL Eating: Not assessed Grooming: Supervision/safety Where Assessed-Grooming: Standing at sink Upper Body Bathing: Supervision/safety Where Assessed-Upper Body Bathing: Standing at sink Lower Body Bathing: Supervision/safety Where Assessed-Lower Body Bathing: Standing at sink, Sitting at sink Upper Body Dressing: Supervision/safety Where Assessed-Upper Body Dressing: Sitting at sink Lower Body Dressing: Supervision/safety Where Assessed-Lower Body Dressing: Standing at sink, Sitting at sink Toileting: Supervision/safety Where Assessed-Toileting: Toilet, Recruitment consultant Transfer: Close supervision Toilet Transfer Method: Ambulating Tub/Shower Transfer: Close supervison Tub/Shower Transfer Method: Optometrist: Gaffer Baseline Vision/History: No visual deficits Patient Visual Report: No change from baseline Vision Assessment?: No apparent visual deficits Eye Alignment: Within Functional Limits Ocular Range of Motion: Within Functional Limits Depth Perception: Overshoots Perception  Perception: Impaired Comments: R inattention to body Praxis Praxis Impairment Details: Motor planning Praxis-Other Comments: improved since eval Cognition Overall Cognitive Status: Impaired/Different from baseline Arousal/Alertness: Awake/alert Orientation Level: Oriented X4 Attention: Sustained Focused Attention: Appears intact Sustained Attention: Appears intact Selective Attention: Appears intact Memory: Appears intact Awareness: Appears intact Problem Solving: Impaired Problem Solving Impairment: Functional complex Self Monitoring: Impaired Self Monitoring Impairment: Functional complex Safety/Judgment: Appears intact Sensation Sensation Light Touch: Appears Intact Proprioception: Impaired  Detail Proprioception Impaired Details: Impaired RUE Coordination Gross Motor Movements are Fluid and Coordinated: No Fine Motor Movements are Fluid and Coordinated: No Coordination and Movement Description: slow deliberate movements with decreased motor planning and slow initiation, mild R hemi Finger Nose Finger Test: Dysmetria Rt>Lt Heel Shin Test: RLE unable 2/2 weakness, decreased ROM Motor  Motor Motor: Hemiplegia;Motor apraxia Motor - Discharge Observations: Mild R hemi, motor apraxia R side Mobility  Bed Mobility  Rolling Right: Independent Rolling Left: Independent Supine to Sit: Independent Sit to Supine: Independent Transfers Sit to Stand: Supervision/Verbal cueing Stand to Sit: Supervision/Verbal cueing  Trunk/Postural Assessment  Cervical Assessment Cervical Assessment: Exceptions to WFL(head forward) Thoracic Assessment Thoracic Assessment: Exceptions to WFL(rounded shoulders) Lumbar Assessment Lumbar Assessment: Exceptions to WFL(post pelvic tilt) Postural Control Postural Control: Deficits on evaluation(delayed/insufficient- improved since eval)  Balance Balance Balance Assessed: Yes Dynamic Sitting Balance Dynamic Sitting - Level of Assistance: 7: Independent Dynamic Standing Balance Dynamic Standing - Level of Assistance: 5: Stand by assistance Extremity/Trunk Assessment RUE Assessment RUE Assessment: Within Functional Limits General Strength Comments: 4-/5 grossly LUE Assessment LUE Assessment: Within Functional Limits General Strength Comments: 4/5 grossly   Tonny Branch 11/15/2019, 7:48 AM

## 2019-11-15 NOTE — Progress Notes (Signed)
[facility] PHYSICAL MEDICINE & REHABILITATION PROGRESS NOTE   Subjective/Complaints: Had a good night. Feels that nuedexta has helped his emotional lability. Would like to continue it at home  ROS: Patient denies fever, rash, sore throat, blurred vision, nausea, vomiting, diarrhea, cough, shortness of breath or chest pain, joint or back pain, headache .   Objective:   No results found. Recent Labs    11/13/19 0947  WBC 5.1  HGB 12.9*  HCT 41.5  PLT 236   Recent Labs    11/13/19 0947 11/15/19 0510  NA 138 135  K 4.8 4.5  CL 104 103  CO2 26 25  GLUCOSE 141* 124*  BUN 26* 20  CREATININE 1.13 0.97  CALCIUM 9.2 9.0    Intake/Output Summary (Last 24 hours) at 11/15/2019 0854 Last data filed at 11/15/2019 0700 Gross per 24 hour  Intake 500 ml  Output 750 ml  Net -250 ml     Physical Exam: Vital Signs Blood pressure 108/70, pulse 65, temperature 98.9 F (37.2 C), temperature source Oral, resp. rate 16, height 5\' 10"  (1.778 m), weight 90.7 kg, SpO2 100 %.  Physical Exam   Constitutional: No distress . Vital signs reviewed. HEENT: EOMI, oral membranes moist Neck: supple Cardiovascular: RRR without murmur. No JVD    Respiratory/Chest: CTA Bilaterally without wheezes or rales. Normal effort    GI/Abdomen: BS +, non-tender, non-distended Ext: no clubbing, cyanosis, tr edema Psych: pleasant and cooperative Musc: no swelling or pain Neurological: He is alert and oriented x 3. Dysarthric. Initiating more.  Sensation intact to light touch x 4 extremities. Ongoing apraxic speech, motor. RUE 4/5 . RLE 3+ to 4/5. LUE and LLE 5/5. Decreased LT RUE and RLE.  Skin: Skin is warm and dry. chronic venous changes BLE ongoing   Assessment/Plan: 1. Functional deficits secondary to R MCA stroke which require 3+ hours per day of interdisciplinary therapy in a comprehensive inpatient rehab setting.  Physiatrist is providing close team supervision and 24 hour management of active medical  problems listed below.  Physiatrist and rehab team continue to assess barriers to discharge/monitor patient progress toward functional and medical goals  Care Tool:  Bathing    Body parts bathed by patient: Right arm, Left arm, Chest, Abdomen, Front perineal area, Right upper leg, Left upper leg, Face, Buttocks, Right lower leg, Left lower leg   Body parts bathed by helper: Left lower leg     Bathing assist Assist Level: Supervision/Verbal cueing     Upper Body Dressing/Undressing Upper body dressing   What is the patient wearing?: Pull over shirt    Upper body assist Assist Level: Supervision/Verbal cueing    Lower Body Dressing/Undressing Lower body dressing      What is the patient wearing?: Incontinence brief, Pants     Lower body assist Assist for lower body dressing: Supervision/Verbal cueing     Toileting Toileting    Toileting assist Assist for toileting: Supervision/Verbal cueing     Transfers Chair/bed transfer  Transfers assist     Chair/bed transfer assist level: Supervision/Verbal cueing     Locomotion Ambulation   Ambulation assist      Assist level: Supervision/Verbal cueing Assistive device: Walker-rolling Max distance: 150   Walk 10 feet activity   Assist     Assist level: Supervision/Verbal cueing Assistive device: Walker-rolling   Walk 50 feet activity   Assist    Assist level: Supervision/Verbal cueing Assistive device: Walker-rolling    Walk 150 feet activity   Assist  Assist level: Supervision/Verbal cueing Assistive device: Walker-rolling    Walk 10 feet on uneven surface  activity   Assist Walk 10 feet on uneven surfaces activity did not occur: Safety/medical concerns(decreased balance, motor planning, and R foot clearance)   Assist level: Supervision/Verbal cueing Assistive device: Photographer   Type of Wheelchair: Manual    Wheelchair assist level:  Supervision/Verbal cueing, Moderate Assistance - Patient 50 - 74% Max wheelchair distance: 150'    Wheelchair 50 feet with 2 turns activity    Assist        Assist Level: Supervision/Verbal cueing   Wheelchair 150 feet activity     Assist      Assist Level: Moderate Assistance - Patient 50 - 74%   Blood pressure 108/70, pulse 65, temperature 98.9 F (37.2 C), temperature source Oral, resp. rate 16, height 5\' 10"  (1.778 m), weight 90.7 kg, SpO2 100 %.  Medical Problem List and Plan: 1.   right MCA scattered and single punctate right cerebellar infarct with right M1 occlusion status post right M1 thrombectomy.   -Pt has history of L subcorical and pontine  with chronic right-sided residual weakness             -patient may  shower             -ELOS/Goals: 11/16/19- Supervision  -Patient to see Dr. 01/16/20 in the office for transitional care encounter in 1-2 weeks.   -Continue CIR PT, OT, SLP  2.  Antithrombotics: -DVT/anticoagulation: continue with sq heparin.  -recent dopplers on 5/20 were clear             -antiplatelet therapy: Aspirin 81 mg daily and Plavix 75 mg daily 3. Pain Management: Tylenol as needed. Well controlled at present 4. Mood: Provide emotional support, has had period of anxiety.              -antipsychotic agents: N/A  -  nuedexta effective for pseudobulbar affect (started 6/1) 5. Neuropsych: This patient is capable of making decisions on his own behalf. 6. Skin/Wound Care: Routine skin checks  -eucerin cream/vaseline for dry skin- switched to Amlactin to help exfoilate 7. Fluids/Electrolytes/Nutrition: encourage appropriate PO  -bun increased, see below 8.  Dysphagia.  Follow-up speech therapy.  Presently on dysphagia #2 thin liquid diet.  -tolerating diet well, needs to take his time 9.  Hypertension.  Lisinopril 40 mg daily decreased to 20mg  on 5/23  -6/1, lisinopril now held. bp's still soft   -encourage fluids   -TEDS  6/3 bp's better, BUN  down to 20 today   -continue with current plan Vitals:   11/14/19 1426 11/14/19 1944  BP: 129/70 108/70  Pulse: 73 65  Resp: 20 16  Temp: 98.2 F (36.8 C) 98.9 F (37.2 C)  SpO2: 96% 100%   10.  Seizure prophylaxis.  Keppra 500 mg twice daily.  EEG negative  -no further activity  -continue keppra at least until f/u as outpt with neurology 11.  Hyperlipidemia.  Lipitor 12.  Morbid obesity?   BMI only 28.7 13.  New findings diabetes mellitus.  Hemoglobin A1c 7.4.  SSI.   CBGs have been reasonably controlled 6/3  -CM diet 14.  Tobacco abuse.  Counseling 15.  Medical noncompliance.  Counseling 16. Constipation   - senokot 2 tabs daily and has sorbitol prn.   -moving bowels currently       LOS: 12 days A FACE TO FACE EVALUATION WAS PERFORMED  Meredith Staggers 11/15/2019, 8:54 AM

## 2019-11-15 NOTE — Progress Notes (Signed)
Physical Therapy Session Note  Patient Details  Name: Jeffery Reilly MRN: [id number] Date of Birth: 14-Nov-1945  Today's Date: 11/15/2019 PT Individual Time: 1105-1200 PT Individual Time Calculation (min): 55 min   Short Term Goals: Week 2:  PT Short Term Goal 1 (Week 2): =LTGs due to ELOS  Skilled Therapeutic Interventions/Progress Updates:   Pt received sitting in recliner and agreeable to PT. PT instructed pt in Grad day assessment to measure progress toward goals. See d/c note for details. PT assisted pt to don Bil shoes and foot up brace. Gait training with RW x 23f+120ft +1537fto rehab and from gyms with supervision assist and min cues for step length and height on the R. Stair management training x 12 steps with BUE support on rails, supervision assist, and cues for step to gait pattern with poor cary over, but no LOB. Bed mobility on mat table without assist. Car transfer training with RW and supervision assist. Gait training up/down ramp and across mulch with RW and supervision assist. Patient returned to room and performed stand pivot to recliner with RW and supervision assist. Pt left sitting in recliner with call bell in reach and all needs met.             Therapy Documentation Precautions:  Precautions Precautions: Fall Precaution Comments: Mild R hemi, baseline Restrictions Weight Bearing Restrictions: No    Pain:   denies    Therapy/Group: Individual Therapy  AuLorie Phenix/08/2019, 12:05 PM

## 2019-11-15 NOTE — Plan of Care (Signed)
  Problem: Consults Goal: RH STROKE PATIENT EDUCATION Description: See Patient Education module for education specifics moderate assistance Outcome: Progressing Goal: Nutrition Consult-if indicated Outcome: Progressing Goal: Diabetes Guidelines if Diabetic/Glucose > 140 Description: If diabetic or lab glucose is > 140 mg/dl - Initiate Diabetes/Hyperglycemia Guidelines & Document Interventions  Outcome: Progressing   Problem: RH BOWEL ELIMINATION Goal: RH STG MANAGE BOWEL WITH ASSISTANCE Description: STG Manage Bowel with moderate Assistance. Outcome: Progressing Goal: RH STG MANAGE BOWEL W/MEDICATION W/ASSISTANCE Description: STG Manage Bowel with Medication with moderate Assistance. Outcome: Progressing   Problem: RH BLADDER ELIMINATION Goal: RH STG MANAGE BLADDER WITH ASSISTANCE Description: STG Manage Bladder With moderate Assistance Outcome: Progressing Goal: RH STG MANAGE BLADDER WITH MEDICATION WITH ASSISTANCE Description: STG Manage Bladder With Medication With moderateAssistance. Outcome: Progressing   Problem: RH SKIN INTEGRITY Goal: RH STG MAINTAIN SKIN INTEGRITY WITH ASSISTANCE Description: STG Maintain Skin Integrity With moderate Assistance. Outcome: Progressing   Problem: RH SAFETY Goal: RH STG ADHERE TO SAFETY PRECAUTIONS W/ASSISTANCE/DEVICE Description: STG Adhere to Safety Precautions With moderate Assistance/Device. Outcome: Progressing Goal: RH STG DECREASED RISK OF FALL WITH ASSISTANCE Description: STG Decreased Risk of Fall With moderate Assistance. Outcome: Progressing   Problem: RH COGNITION-NURSING Goal: RH STG USES MEMORY AIDS/STRATEGIES W/ASSIST TO PROBLEM SOLVE Description: STG Uses Memory Aids/Strategies With moderate Assistance to Problem Solve. Outcome: Progressing Goal: RH STG ANTICIPATES NEEDS/CALLS FOR ASSIST W/ASSIST/CUES Description: STG Anticipates Needs/Calls for Assist With Assistance/Cues. Outcome: Progressing   Problem: RH PAIN  MANAGEMENT Goal: RH STG PAIN MANAGED AT OR BELOW PT'S PAIN GOAL Description: Pain less than 2 Outcome: Progressing   Problem: RH KNOWLEDGE DEFICIT Goal: RH STG INCREASE KNOWLEDGE OF DIABETES Description: Moderate assistance Outcome: Progressing Goal: RH STG INCREASE KNOWLEDGE OF HYPERTENSION Description: Moderate assistance Outcome: Progressing Goal: RH STG INCREASE KNOWLEDGE OF DYSPHAGIA/FLUID INTAKE Description: Moderate assistance Outcome: Progressing Goal: RH STG INCREASE KNOWLEGDE OF HYPERLIPIDEMIA Description: Moderate assistance Outcome: Progressing Goal: RH STG INCREASE KNOWLEDGE OF STROKE PROPHYLAXIS Description: Moderate assistance Outcome: Progressing

## 2019-11-16 DIAGNOSIS — I69391 Dysphagia following cerebral infarction: Secondary | ICD-10-CM

## 2019-11-16 DIAGNOSIS — F482 Pseudobulbar affect: Secondary | ICD-10-CM

## 2019-11-16 MED ORDER — DEXTROMETHORPHAN-QUINIDINE 20-10 MG PO CAPS: 1.0000 | ORAL_CAPSULE | Freq: Every day | ORAL | 2 refills | Status: AC

## 2019-11-16 MED ORDER — DEXTROMETHORPHAN-QUINIDINE 20-10 MG PO CAPS: 1.0000 | ORAL_CAPSULE | Freq: Every day | ORAL | Status: DC

## 2019-11-16 NOTE — Progress Notes (Signed)
Patient is to be D/C to home with family. TTB is to be shipped to home due to being out of stock. D/C instructions given by Mannie Stabile.  Patient belongings have been packed. No concerns to report at this time. Leane Para, LPN

## 2019-11-16 NOTE — Progress Notes (Signed)
[place] PHYSICAL MEDICINE & REHABILITATION PROGRESS NOTE   Subjective/Complaints: Pt up in chair watching tv. In good spirits. Waiting for wife to come pick him up.   ROS: Patient denies fever, rash, sore throat, blurred vision, nausea, vomiting, diarrhea, cough, shortness of breath or chest pain, joint or back pain, headache, or mood change.    Objective:   No results found. Recent Labs    11/13/19 0947  WBC 5.1  HGB 12.9*  HCT 41.5  PLT 236   Recent Labs    11/13/19 0947 11/15/19 0510  NA 138 135  K 4.8 4.5  CL 104 103  CO2 26 25  GLUCOSE 141* 124*  BUN 26* 20  CREATININE 1.13 0.97  CALCIUM 9.2 9.0    Intake/Output Summary (Last 24 hours) at 11/16/2019 1245 Last data filed at 11/16/2019 0800 Gross per 24 hour  Intake 600 ml  Output 275 ml  Net 325 ml     Physical Exam: Vital Signs Blood pressure 115/65, pulse (!) 57, temperature 98.5 F (36.9 C), temperature source Oral, resp. rate 20, height 5\' 10"  (1.778 m), weight 90.7 kg, SpO2 100 %.  Physical Exam   Constitutional: No distress . Vital signs reviewed. HEENT: EOMI, oral membranes moist Neck: supple Cardiovascular: RRR without murmur. No JVD    Respiratory/Chest: CTA Bilaterally without wheezes or rales. Normal effort    GI/Abdomen: BS +, non-tender, non-distended Ext: no clubbing, cyanosis, or significant edema Psych: pleasant and cooperative Musc: no swelling or pain Neurological: He is alert and oriented x 3.      Still dysarthric. Less apraxic overall.  RUE 4/5 . RLE 3+ to 4/5. LUE and LLE 5/5. Decreased LT RUE and RLE.  Skin: Skin is warm and dry. chronic venous changes BLE present   Assessment/Plan: 1. Functional deficits secondary to R MCA stroke which require 3+ hours per day of interdisciplinary therapy in a comprehensive inpatient rehab setting.  Physiatrist is providing close team supervision and 24 hour management of active medical problems listed below.  Physiatrist and rehab team  continue to assess barriers to discharge/monitor patient progress toward functional and medical goals  Care Tool:  Bathing    Body parts bathed by patient: Right arm, Left arm, Chest, Abdomen, Front perineal area, Right upper leg, Left upper leg, Face, Buttocks, Right lower leg, Left lower leg   Body parts bathed by helper: Left lower leg     Bathing assist Assist Level: Supervision/Verbal cueing     Upper Body Dressing/Undressing Upper body dressing   What is the patient wearing?: Pull over shirt    Upper body assist Assist Level: Supervision/Verbal cueing    Lower Body Dressing/Undressing Lower body dressing      What is the patient wearing?: Incontinence brief, Pants     Lower body assist Assist for lower body dressing: Supervision/Verbal cueing     Toileting Toileting    Toileting assist Assist for toileting: Supervision/Verbal cueing     Transfers Chair/bed transfer  Transfers assist     Chair/bed transfer assist level: Supervision/Verbal cueing     Locomotion Ambulation   Ambulation assist      Assist level: Supervision/Verbal cueing Assistive device: Walker-rolling Max distance: 200   Walk 10 feet activity   Assist     Assist level: Supervision/Verbal cueing Assistive device: Walker-rolling   Walk 50 feet activity   Assist    Assist level: Supervision/Verbal cueing Assistive device: Walker-rolling    Walk 150 feet activity   Assist  Assist level: Supervision/Verbal cueing Assistive device: Walker-rolling    Walk 10 feet on uneven surface  activity   Assist Walk 10 feet on uneven surfaces activity did not occur: Safety/medical concerns(decreased balance, motor planning, and R foot clearance)   Assist level: Supervision/Verbal cueing Assistive device: Photographer Will patient use wheelchair at discharge?: No Type of Wheelchair: Manual Wheelchair activity did not occur:  N/A  Wheelchair assist level: Supervision/Verbal cueing, Moderate Assistance - Patient 50 - 74% Max wheelchair distance: 150'    Wheelchair 50 feet with 2 turns activity    Assist    Wheelchair 50 feet with 2 turns activity did not occur: N/A   Assist Level: Supervision/Verbal cueing   Wheelchair 150 feet activity     Assist  Wheelchair 150 feet activity did not occur: N/A   Assist Level: Moderate Assistance - Patient 50 - 74%   Blood pressure 115/65, pulse (!) 57, temperature 98.5 F (36.9 C), temperature source Oral, resp. rate 20, height 5\' 10"  (1.778 m), weight 90.7 kg, SpO2 100 %.  Medical Problem List and Plan: 1.   right MCA scattered and single punctate right cerebellar infarct with right M1 occlusion status post right M1 thrombectomy.   -Pt has history of L subcorical and pontine  with chronic right-sided residual weakness             -patient may  shower             -ELOS/Goals: DC HOME TODAY  -Patient to see Dr. in the office for transitional care encounter in 1-2 weeks.   -Continue CIR PT, OT, SLP  2.  Antithrombotics: -DVT/anticoagulation: continue with sq heparin.  -recent dopplers on 5/20 were clear             -antiplatelet therapy: Aspirin 81 mg daily and Plavix 75 mg daily 3. Pain Management: Tylenol as needed. Well controlled at present 4. Mood: Provide emotional support, has had period of anxiety.              -antipsychotic agents: N/A  -  nuedexta effective for pseudobulbar affect (started 6/1)   -doesn't appear to have coverage for med   -gave him office samples. Will use once daily to spread out. 5. Neuropsych: This patient is capable of making decisions on his own behalf. 6. Skin/Wound Care: Routine skin checks  -eucerin cream/vaseline for dry skin- switched to Amlactin to help exfoilate 7. Fluids/Electrolytes/Nutrition: encourage appropriate PO, fluids  -bun improved 8.  Dysphagia.  Follow-up speech therapy.  Presently on  dysphagia #2 thin liquid diet.  -tolerating diet well, needs to take his time 9.  Hypertension.  Lisinopril 40 mg daily decreased to 20mg  on 5/23  -6/1, lisinopril now held. bp's still soft   -encourage fluids   -TEDS  6/3-4 bp's better, BUN down to 20 today   -continue with current plan Vitals:   11/15/19 2021 11/16/19 0456  BP: 107/66 115/65  Pulse: (!) 59 (!) 57  Resp: 16 20  Temp: 98.5 F (36.9 C) 98.5 F (36.9 C)  SpO2: 100% 100%   10.  Seizure prophylaxis.  Keppra 500 mg twice daily.  EEG negative  -no further activity  -continue keppra at least until f/u as outpt with neurology 11.  Hyperlipidemia.  Lipitor 12.  Morbid obesity?   BMI only 28.7 13.  New findings diabetes mellitus.  Hemoglobin A1c 7.4.  SSI.   CBGs have been reasonably controlled 6/4  -  CM diet 14.  Tobacco abuse.  Counseling 15.  Medical noncompliance.  Counseling 16. Constipation   - senokot 2 tabs daily and has sorbitol prn.   -moving bowels currently       LOS: 13 days A FACE TO FACE EVALUATION WAS PERFORMED  Meredith Staggers 11/16/2019, 9:22 AM

## 2019-11-19 NOTE — Progress Notes (Signed)
Inpatient Rehabilitation Care Coordinator  Discharge Note  The overall goal for the admission was met for:   Discharge location: D/c to home with his wife and dtr. Wife to provide 24/7 supervision. Intermittent support from dtr.  Length of Stay: Yes. 13 days.  Discharge activity level: Yes. Mod I.  Home/community participation: Yes. Limited  Services provided included: MD, RD, PT, OT, SLP, RN, CM, TR, Pharmacy, Neuropsych and SW  Financial Services: Medicare  Follow-up services arranged: Home Health: [place] for HHPT/OT/ST and DME: [place] for TTB (item to be shipped to pt home).  New patient appointment/hosptial follow-up:  Monday, June 14th at 9:45 with Evern Bio [address], [place], [place] [address] [phone]  Comments (or additional information): contact pt wife Lynn Ito [phone]  Patient/Family verbalized understanding of follow-up arrangements: Yes  Individual responsible for coordination of the follow-up plan: Pt to have assistance with coordinating care.  Confirmed correct DME delivered: Rana Snare 11/19/2019    Rana Snare

## 2019-11-21 ENCOUNTER — Telehealth: Payer: Self-pay | Admitting: Registered Nurse

## 2019-11-21 NOTE — Telephone Encounter (Signed)
Transitional Care call  Transitional Questions Answered by Mrs. Toni Arthurs Mrs. Mccadden states she will not be bringing her husband to [place] for HFU appointments due to Transportation issues. She was instructed to speak to her PCP to Obtain Neurology and Intervention Radiology Appointments, she verbalizes understanding.   Patient name: Jeffery Reilly DOB: 06-06-1946 1. Are you/is patient experiencing any problems since coming home? No a. Are there any questions regarding any aspect of care? No 2. Are there any questions regarding medications administration/dosing? No a. Are meds being taken as prescribed? Yes b. "Patient should review meds with caller to confirm" Medication List Reviewed. 3. Have there been any falls? No 4. Has Home Health been to the house and/or have they contacted you? Yes, [facility] a. If not, have you tried to contact them? NA b. Can we help you contact them? NA 5. Are bowels and bladder emptying properly? Yes a. Are there any unexpected incontinence issues? No b. If applicable, is patient following bowel/bladder programs? NA 6. Any fevers, problems with breathing, unexpected pain? No 7. Are there any skin problems or new areas of breakdown? No 8. Has the patient/family member arranged specialty MD follow up (ie cardiology/neurology/renal/surgical/etc.)?  See above Note. Mrs. Sallade refuses to brin Mr Shutes to [facility] appointments in [place], she states Mr. Tucholski has a PCP appointment on 11/26/2019, she will speak with his PCP and asked if she can find Mr. [employer] in [place].  a. Can we help arrange? NA 9. Does the patient need any other services or support that we can help arrange? NO 10. Are caregivers following through as expected in assisting the patient? Yes  11. Has the patient quit smoking, drinking alcohol, or using drugs as recommended? (                        )  Appointment date/time Mrs. Sarin is unable to bring Mr. Dorsch to  appointment due to transportation hardship she states. She was instructed to speak with Mr. Maniscalco PCP. She states she will speak with Mr Soth PCP on Monday 11/26/2019, he has an appointment.    [id number] [employer]

## 2019-11-28 ENCOUNTER — Encounter: Payer: Medicare Other | Admitting: Physical Medicine and Rehabilitation

## 2020-01-07 ENCOUNTER — Ambulatory Visit: Payer: Medicare Other | Admitting: Neurology

## 2020-02-05 ENCOUNTER — Other Ambulatory Visit: Payer: Self-pay

## 2020-02-05 NOTE — Patient Outreach (Signed)
[facility]) Care Management  02/05/2020  Asbury Hair 1946/04/24 [id number]  First telephone outreach attempt to obtain mRS. No answer. Left message for returned call.  [facility] Management Assistant [phone]

## 2020-02-11 ENCOUNTER — Other Ambulatory Visit: Payer: Self-pay

## 2020-02-11 NOTE — Patient Outreach (Signed)
[facility]) Care Management  02/11/2020  Juanluis Guastella 01-25-1946 [id number]   Telephone outreach to patient to obtain mRS was successfully completed. MRS= 3.  Domingo Cocking [facility] Care Management Assistant [phone]

## 2020-04-02 ENCOUNTER — Other Ambulatory Visit: Payer: Self-pay

## 2020-04-02 ENCOUNTER — Emergency Department
Admission: EM | Admit: 2020-04-02 | Discharge: 2020-04-02 | Disposition: A | Discharge status: Home / Self Care | Payer: Medicare Other | Attending: Emergency Medicine | Admitting: Emergency Medicine

## 2020-04-02 ENCOUNTER — Emergency Department: Payer: Medicare Other

## 2020-04-02 ENCOUNTER — Encounter: Payer: Self-pay | Admitting: Emergency Medicine

## 2020-04-02 DIAGNOSIS — Z87891 Personal history of nicotine dependence: Secondary | ICD-10-CM | POA: Diagnosis not present

## 2020-04-02 DIAGNOSIS — Z7982 Long term (current) use of aspirin: Secondary | ICD-10-CM | POA: Diagnosis not present

## 2020-04-02 DIAGNOSIS — S0101XA Laceration without foreign body of scalp, initial encounter: Secondary | ICD-10-CM

## 2020-04-02 DIAGNOSIS — R911 Solitary pulmonary nodule: Secondary | ICD-10-CM

## 2020-04-02 DIAGNOSIS — Y9389 Activity, other specified: Secondary | ICD-10-CM | POA: Diagnosis not present

## 2020-04-02 DIAGNOSIS — W19XXXA Unspecified fall, initial encounter: Secondary | ICD-10-CM

## 2020-04-02 DIAGNOSIS — Y9289 Other specified places as the place of occurrence of the external cause: Secondary | ICD-10-CM | POA: Insufficient documentation

## 2020-04-02 DIAGNOSIS — W010XXA Fall on same level from slipping, tripping and stumbling without subsequent striking against object, initial encounter: Secondary | ICD-10-CM | POA: Insufficient documentation

## 2020-04-02 MED ORDER — LIDOCAINE-EPINEPHRINE (PF) 1 %-1:200000 IJ SOLN
30.0000 mL | Freq: Once | INTRAMUSCULAR | Status: AC
  Administered 2020-04-02: 30 mL via INTRADERMAL
  Filled 2020-04-02: qty 30

## 2020-04-02 MED ORDER — LIDOCAINE-EPINEPHRINE-TETRACAINE (LET) TOPICAL GEL
3.0000 mL | Freq: Once | TOPICAL | Status: AC
  Administered 2020-04-02: 3 mL via TOPICAL

## 2020-04-02 MED ORDER — LIDOCAINE HCL (PF) 1 % IJ SOLN
5.0000 mL | Freq: Once | INTRAMUSCULAR | Status: AC
  Administered 2020-04-02: 5 mL via INTRADERMAL
  Filled 2020-04-02: qty 5

## 2020-04-02 NOTE — Discharge Instructions (Addendum)
Your CT scan shows a pulmonary nodule in one of your lungs.  This has the possibility of being concerning for cancer.  I want you to follow-up with Dr. Orlie Dakin, who will do some more testing on this nodule.  Please call him tomorrow for an appointment.

## 2020-04-02 NOTE — ED Triage Notes (Signed)
Pt in via EMS from home with c/o fall. Pt had an unwitnessed fall while checking the mail. He stepped on an unlevel space. Pt with inch laceration to center of head. Pt takes plavix. Pt also has slurred speech from previous stroke and wife reports this speech is normal. 143/69, HR 63, 100% RA, 126FSBS

## 2020-04-02 NOTE — ED Triage Notes (Signed)
Pt presents to ED via [facility] with c/o mechanical fall in the yard. Pt denies any pain at this time. PT A&O x4, per first RN note pt with slurred speech at baseline.   Pt with approx 1in laceration to posterior head at this time. Bleeding controlled on assessment. Laceration rewrapped by this RN in triage.

## 2020-04-02 NOTE — ED Notes (Signed)
Pt has a small 2 inch laceration on the posterior part of the head.

## 2020-04-02 NOTE — ED Provider Notes (Signed)
[facility] Emergency Department Provider Note  ____________________________________________  Time seen: Approximately 5:57 PM  I have reviewed the triage vital signs and the nursing notes.   HISTORY  Chief Complaint Fall    HPI Jeffery Reilly is a 74 y.o. male that presents to the emergency department for evaluation after a fall today.  Patient states that he was going out to the mailbox when he tripped over a beer can.  He hit his head.  He has a laceration to his scalp.  He is on Plavix.  He denies any pain currently.  He has some chronic deficits from a previous stroke, which have not changed.  He is here with his wife.  Past Medical History:  Diagnosis Date  . Stroke [facility])     Patient Active Problem List   Diagnosis Date Noted  . Pseudobulbar affect   . Dysphagia due to recent stroke   . Right middle cerebral artery stroke (HCC) 11/03/2019  . Stroke [facility]) 10/30/2019    Past Surgical History:  Procedure Laterality Date  . ABDOMINAL SURGERY    . IR CT HEAD LTD  10/30/2019  . IR INTRA CRAN STENT  10/30/2019  . IR PERCUTANEOUS ART THROMBECTOMY/INFUSION INTRACRANIAL INC DIAG ANGIO  10/30/2019  . IR US GUIDE VASC ACCESS RIGHT  10/30/2019  . RADIOLOGY WITH ANESTHESIA N/A 10/30/2019   Procedure: IR WITH ANESTHESIA;  Surgeon: Gilmer Mor, DO;  Location: [facility] OR;  Service: Radiology;  Laterality: N/A;    Prior to Admission medications   Medication Sig Start Date End Date Taking? Authorizing Provider  acetaminophen (TYLENOL) 325 MG tablet Take 2 tablets (650 mg total) by mouth every 4 (four) hours as needed for mild pain (or temp > 37.5 C (99.5 F)). 11/15/19   Angiulli, Mcarthur Rossetti, PA-C  aspirin 81 MG chewable tablet Chew 1 tablet (81 mg total) by mouth daily. 11/15/19   Angiulli, Mcarthur Rossetti, PA-C  atorvastatin (LIPITOR) 80 MG tablet Take 1 tablet (80 mg total) by mouth daily. 11/15/19   Angiulli, Mcarthur Rossetti, PA-C  clopidogrel (PLAVIX) 75 MG tablet Take 1 tablet (75 mg  total) by mouth daily. 11/15/19   Angiulli, Mcarthur Rossetti, PA-C  Dextromethorphan-quiNIDine (NUEDEXTA) 20-10 MG capsule Take 1 capsule by mouth daily. 11/17/19   Ranelle Oyster, MD  levETIRAcetam (KEPPRA) 500 MG tablet Take 1 tablet (500 mg total) by mouth 2 (two) times daily. 11/15/19   Angiulli, Mcarthur Rossetti, PA-C  Multiple Vitamin (MULTIVITAMIN WITH MINERALS) TABS tablet Take 1 tablet by mouth daily. 11/15/19   Angiulli, Mcarthur Rossetti, PA-C    Allergies Patient has no known allergies.  Family History  Problem Relation Age of Onset  . Hypertension Mother   . Hypertension Father     Social History Social History   Tobacco Use  . Smoking status: Former Games developer  . Smokeless tobacco: Never Used  Vaping Use  . Vaping Use: Never used  Substance Use Topics  . Alcohol use: Never  . Drug use: Never     Review of Systems  Cardiovascular: No chest pain. Respiratory: No SOB. Gastrointestinal: No abdominal pain.  No nausea, no vomiting.  Musculoskeletal: Negative for musculoskeletal pain. Skin: Negative for rash, abrasions,  Ecchymosis. Positive for scalp laceration. Neurological: Negative for headaches   ____________________________________________   PHYSICAL EXAM:  VITAL SIGNS: ED Triage Vitals  Enc Vitals Group     BP 04/02/20 1445 138/74     Pulse Rate 04/02/20 1445 62     Resp 04/02/20  1445 20     Temp 04/02/20 1445 98.6 F (37 C)     Temp Source 04/02/20 1445 Oral     SpO2 04/02/20 1445 96 %     Weight 04/02/20 1440 160 lb (72.6 kg)     Height 04/02/20 1440 5\' 9"  (1.753 m)     Head Circumference --      Peak Flow --      Pain Score 04/02/20 1440 0     Pain Loc --      Pain Edu? --      Excl. in GC? --      Constitutional: Alert and oriented. Well appearing and in no acute distress. Eyes: Conjunctivae are normal. PERRL. EOMI. Head: 2 cm posterior scalp laceration. ENT:      Ears:      Nose: No congestion/rhinnorhea.      Mouth/Throat: Mucous membranes are moist.  Neck: No  stridor.  No cervical spine tenderness to palpation. Cardiovascular: Normal rate, regular rhythm.  Good peripheral circulation. Respiratory: Normal respiratory effort without tachypnea or retractions. Lungs CTAB. Good air entry to the bases with no decreased or absent breath sounds. Gastrointestinal: Bowel sounds 4 quadrants. Soft and nontender to palpation. No guarding or rigidity. No palpable masses. No distention.  Musculoskeletal: Full range of motion to all extremities. No gross deformities appreciated. Ambulatory. Neurologic:  Normal speech and language. No gross focal neurologic deficits are appreciated.  Skin:  Skin is warm, dry and intact. No rash noted. Psychiatric: Mood and affect are normal. Speech and behavior are normal. Patient exhibits appropriate insight and judgement.   ____________________________________________   LABS (all labs ordered are listed, but only abnormal results are displayed)  Labs Reviewed - No data to display ____________________________________________  EKG   ____________________________________________  RADIOLOGY 04/04/20, personally viewed and evaluated these images (plain radiographs) as part of my medical decision making, as well as reviewing the written report by the radiologist.  CT Head Wo Contrast  Result Date: 04/02/2020 CLINICAL DATA:  Head trauma, moderate/severe. Neck trauma. Additional history provided: Fall, head laceration. Patient on Plavix. EXAM: CT HEAD WITHOUT CONTRAST CT CERVICAL SPINE WITHOUT CONTRAST TECHNIQUE: Multidetector CT imaging of the head and cervical spine was performed following the standard protocol without intravenous contrast. Multiplanar CT image reconstructions of the cervical spine were also generated. COMPARISON:  MRI/MRA head 10/31/2019, head CT 10/30/2019, CTA head/neck 10/30/2019. FINDINGS: CT HEAD FINDINGS Brain: Generalized cerebral atrophy. Known small chronic multifocal right MCA territory  infarcts were better appreciated on the brain MRI of 10/31/2019 (acute at that time). Redemonstrated chronic infarcts within the bilateral corona radiata, deep gray nuclei and within the pons. Associated ex vacuo dilatation of the left lateral ventricle. Background advanced ill-defined hypoattenuation within the cerebral white matter which is nonspecific, but compatible with chronic small vessel ischemic disease. There is no acute intracranial hemorrhage. No acute demarcated cortical infarct is identified. No extra-axial fluid collection. No evidence of intracranial mass. No midline shift. Vascular: No hyperdense vessel. Redemonstrated stent is present within the M1 right middle cerebral artery. Atherosclerotic calcifications. Skull: Normal. Negative for fracture or focal lesion. Sinuses/Orbits: Visualized orbits show no acute finding. Moderate mucosal thickening within the right frontal sinus. Frothy secretions and small air-fluid level within the left sphenoid sinus. No significant mastoid effusion. Other: Left parietal scalp and laceration. CT CERVICAL SPINE FINDINGS Alignment: Straightening of the expected cervical lordosis. Trace C2-C3 grade 1 retrolisthesis. Skull base and vertebrae: The basion-dental and atlanto-dental intervals are maintained.No evidence  of acute fracture to the cervical spine. Soft tissues and spinal canal: No prevertebral fluid or swelling. No visible canal hematoma. Disc levels: Cervical spondylosis with multilevel disc space narrowing, disc bulges, posterior disc osteophytes and uncovertebral hypertrophy. Disc space narrowing is moderate/severe at C3-C4 and C6-C7. A C6-C7 posterior disc osteophyte complex likely contributes to at least moderate spinal canal stenosis. Multilevel bony neural foraminal narrowing greatest bilaterally at C3-C4 and C6-C7. Upper chest: 9 mm right upper lobe pulmonary nodule (series 3, image 81) (series 7, image 20). IMPRESSION: CT head: 1. No evidence of acute  intracranial abnormality. 2. Left parietal scalp hematoma and laceration. 3. Generalized cerebral atrophy and chronic ischemic disease with multiple chronic infarcts, as outlined and stable from the MRI brain of 10/31/2019. 4. Status post stenting of the M1 right middle cerebral artery. 5. Moderate right frontal sinus mucosal thickening. 6. Left sphenoid sinusitis. CT cervical spine: 1. No evidence of acute fracture to the cervical spine. 2. Trace C3-C4 grade 1 retrolisthesis. 3. Cervical spondylosis as described and greatest at C3-C4 and C6-C7. 4. 9 mm right upper lobe pulmonary nodule. Consider one of the following for both low-risk and high-risk individuals: (A) repeat chest CT in 3 months, (B) PET-CT for further evaluation, or (C) tissue sampling. This recommendation follows the consensus statement: Guidelines for Management of Incidental Pulmonary Nodules Detected on CT Images: From the Fleischner Society 2017; Radiology 2017; 284:228-243. Electronically Signed   By: Jackey Loge DO   On: 04/02/2020 15:40   CT Cervical Spine Wo Contrast  Result Date: 04/02/2020 CLINICAL DATA:  Head trauma, moderate/severe. Neck trauma. Additional history provided: Fall, head laceration. Patient on Plavix. EXAM: CT HEAD WITHOUT CONTRAST CT CERVICAL SPINE WITHOUT CONTRAST TECHNIQUE: Multidetector CT imaging of the head and cervical spine was performed following the standard protocol without intravenous contrast. Multiplanar CT image reconstructions of the cervical spine were also generated. COMPARISON:  MRI/MRA head 10/31/2019, head CT 10/30/2019, CTA head/neck 10/30/2019. FINDINGS: CT HEAD FINDINGS Brain: Generalized cerebral atrophy. Known small chronic multifocal right MCA territory infarcts were better appreciated on the brain MRI of 10/31/2019 (acute at that time). Redemonstrated chronic infarcts within the bilateral corona radiata, deep gray nuclei and within the pons. Associated ex vacuo dilatation of the left lateral  ventricle. Background advanced ill-defined hypoattenuation within the cerebral white matter which is nonspecific, but compatible with chronic small vessel ischemic disease. There is no acute intracranial hemorrhage. No acute demarcated cortical infarct is identified. No extra-axial fluid collection. No evidence of intracranial mass. No midline shift. Vascular: No hyperdense vessel. Redemonstrated stent is present within the M1 right middle cerebral artery. Atherosclerotic calcifications. Skull: Normal. Negative for fracture or focal lesion. Sinuses/Orbits: Visualized orbits show no acute finding. Moderate mucosal thickening within the right frontal sinus. Frothy secretions and small air-fluid level within the left sphenoid sinus. No significant mastoid effusion. Other: Left parietal scalp and laceration. CT CERVICAL SPINE FINDINGS Alignment: Straightening of the expected cervical lordosis. Trace C2-C3 grade 1 retrolisthesis. Skull base and vertebrae: The basion-dental and atlanto-dental intervals are maintained.No evidence of acute fracture to the cervical spine. Soft tissues and spinal canal: No prevertebral fluid or swelling. No visible canal hematoma. Disc levels: Cervical spondylosis with multilevel disc space narrowing, disc bulges, posterior disc osteophytes and uncovertebral hypertrophy. Disc space narrowing is moderate/severe at C3-C4 and C6-C7. A C6-C7 posterior disc osteophyte complex likely contributes to at least moderate spinal canal stenosis. Multilevel bony neural foraminal narrowing greatest bilaterally at C3-C4 and C6-C7. Upper chest: 9 mm  right upper lobe pulmonary nodule (series 3, image 81) (series 7, image 20). IMPRESSION: CT head: 1. No evidence of acute intracranial abnormality. 2. Left parietal scalp hematoma and laceration. 3. Generalized cerebral atrophy and chronic ischemic disease with multiple chronic infarcts, as outlined and stable from the MRI brain of 10/31/2019. 4. Status post  stenting of the M1 right middle cerebral artery. 5. Moderate right frontal sinus mucosal thickening. 6. Left sphenoid sinusitis. CT cervical spine: 1. No evidence of acute fracture to the cervical spine. 2. Trace C3-C4 grade 1 retrolisthesis. 3. Cervical spondylosis as described and greatest at C3-C4 and C6-C7. 4. 9 mm right upper lobe pulmonary nodule. Consider one of the following for both low-risk and high-risk individuals: (A) repeat chest CT in 3 months, (B) PET-CT for further evaluation, or (C) tissue sampling. This recommendation follows the consensus statement: Guidelines for Management of Incidental Pulmonary Nodules Detected on CT Images: From the Fleischner Society 2017; Radiology 2017; 284:228-243. Electronically Signed   By: Jackey LogeKyle  Golden DO   On: 04/02/2020 15:40    ____________________________________________    PROCEDURES  Procedure(s) performed:    Procedures  LACERATION REPAIR Performed by: Enid DerryAshley Joie Hipps  Consent: Verbal consent obtained.  Consent given by: patient  Prepped and Draped in normal sterile fashion  Wound explored: No foreign bodies   Laceration Location: scalp  Laceration Length: 2 cm  Anesthesia: None  Local anesthetic: lidocaine 1% with epinephrine  Anesthetic total: 3 ml  Irrigation method: syringe  Amount of cleaning: 500ml normal saline  Skin closure: Staples  Number of staples: 7  Patient tolerance: Patient tolerated the procedure well with no immediate complications.  Medications  lidocaine (PF) (XYLOCAINE) 1 % injection 5 mL (5 mLs Intradermal Given 04/02/20 1649)  lidocaine-EPINEPHrine-tetracaine (LET) topical gel (3 mLs Topical Given by Other 04/02/20 1649)  lidocaine-EPINEPHrine (XYLOCAINE-EPINEPHrine) 1 %-1:200000 (PF) injection 30 mL (30 mLs Intradermal Given 04/02/20 1649)     ____________________________________________   INITIAL IMPRESSION / ASSESSMENT AND PLAN / ED COURSE  Pertinent labs & imaging results that  were available during my care of the patient were reviewed by me and considered in my medical decision making (see chart for details).  Review of the [place] CSRS was performed in accordance of the [facility] prior to dispensing any controlled drugs.   Patient presented to emergency department for evaluation after a mechanical fall today.  Vital signs and exam are reassuring.  Head CT and cervical CT are negative for acute abnormalities.  Laceration was repaired with staples.  Incidentally, there is a pulmonary nodule reported on CT scan.  Dr. Orlie DakinFinnegan with medical oncology was consulted and will establish care with patient and follow-up on pulmonary nodule this week.  Patient is here with his wife, who is driving him home.  Patient has been up walking around the emergency department multiple times requesting for discharge.  Patient is to follow up with primary care and oncology as directed. Patient is given ED precautions to return to the ED for any worsening or new symptoms.  Heide GuileWillie Hendry was evaluated in Emergency Department on 04/02/2020 for the symptoms described in the history of present illness. He was evaluated in the context of the global COVID-19 pandemic, which necessitated consideration that the patient might be at risk for infection with the SARS-CoV-2 virus that causes COVID-19. Institutional protocols and algorithms that pertain to the evaluation of patients at risk for COVID-19 are in a state of rapid change based on information released by regulatory bodies including the  CDC and federal and [employer]. These policies and algorithms were followed during the patient's care in the ED.   ____________________________________________  FINAL CLINICAL IMPRESSION(S) / ED DIAGNOSES  Final diagnoses:  Fall, initial encounter  Laceration of scalp, initial encounter  Pulmonary nodule      NEW MEDICATIONS STARTED DURING THIS VISIT:  ED Discharge Orders    None          This chart  was dictated using voice recognition software/Dragon. Despite best efforts to proofread, errors can occur which can change the meaning. Any change was purely unintentional.    Enid Derry, PA-C 04/02/20 2250    Willy Eddy, MD 04/07/20 [phone]

## 2020-04-03 ENCOUNTER — Other Ambulatory Visit: Payer: Self-pay | Admitting: Oncology

## 2020-04-03 DIAGNOSIS — R911 Solitary pulmonary nodule: Secondary | ICD-10-CM

## 2020-04-04 ENCOUNTER — Telehealth: Payer: Self-pay | Admitting: *Deleted

## 2020-04-04 NOTE — Telephone Encounter (Signed)
Called and spoke with pt's wife regarding cancelling new patient visit with Dr. Orlie Dakin on 10/27. Informed that Dr. Orlie Dakin would like pt to follow up in the lung nodule clinic instead. Informed pt's wife that will schedule pt for follow up CT scan and visit in the lung nodule clinic in the next few months. Pt's wife verbalized understanding.

## 2020-04-09 ENCOUNTER — Ambulatory Visit: Payer: Medicare Other | Admitting: Oncology

## 2020-04-09 ENCOUNTER — Other Ambulatory Visit: Payer: Medicare Other

## 2020-04-15 ENCOUNTER — Emergency Department
Admission: EM | Admit: 2020-04-15 | Discharge: 2020-04-15 | Disposition: A | Discharge status: Home / Self Care | Payer: Medicare Other | Attending: Emergency Medicine | Admitting: Emergency Medicine

## 2020-04-15 ENCOUNTER — Other Ambulatory Visit: Payer: Self-pay | Admitting: Oncology

## 2020-04-15 ENCOUNTER — Other Ambulatory Visit: Payer: Self-pay

## 2020-04-15 DIAGNOSIS — Z4802 Encounter for removal of sutures: Secondary | ICD-10-CM | POA: Diagnosis not present

## 2020-04-15 DIAGNOSIS — R911 Solitary pulmonary nodule: Secondary | ICD-10-CM

## 2020-04-15 DIAGNOSIS — Z87891 Personal history of nicotine dependence: Secondary | ICD-10-CM | POA: Insufficient documentation

## 2020-04-15 DIAGNOSIS — Z7982 Long term (current) use of aspirin: Secondary | ICD-10-CM | POA: Diagnosis not present

## 2020-04-15 NOTE — ED Notes (Signed)
Pt assisted to restroom with wheelchair. Pt back in room, sitting in wheelchair per pt request.

## 2020-04-15 NOTE — Progress Notes (Signed)
  Pulmonary Nodule Clinic Telephone Note [facility]   Received referral from [facility] ED.   HPI: Jeffery Reilly is a 74 year old male with past medical history significant for stroke and dysphagia who was recently evaluated in the emergency room for a fall.  Imaging revealed a 9 mm right upper lobe pulmonary nodule.  It is recommended to repeat chest CT in 3 months or a PET scan for further evaluation.  Review and Recommendations: I personally reviewed all patient's previous imaging including most recent CT scans from the emergency room.  I recommend follow-up with noncontrast chest CT in 3 months.  Given the size of the pulmonary nodule, a PET scan would likely not offer any valuable information at this time.  Social History:   Tobacco Use: Medium Risk  . Smoking Tobacco Use: Former Smoker  . Smokeless Tobacco Use: Never Used   High risk factors include: History of heavy smoking, exposure to asbestos, radium or uranium, personal family history of lung cancer, older age, sex (females greater than males), race (black and native [place] greater than weight), marginal speculation, upper lobe location, multiplicity (less than 5 nodules increases risk for malignancy) and emphysema and/or pulmonary fibrosis.   This recommendation follows the consensus statement: Guidelines for Management of Incidental Pulmonary Nodules Detected on CT Images: From the Fleischner Society 2017; Radiology 2017; 284:228-243.    I have placed order for CT scan without contrast to be completed approximately 3 months from previous.  Disposition: Order placed for repeat CT chest. Will notify Micael Hampshire in scheduling. [address] to call patient with appointment date and time. Return to pulmonary nodule clinic a few days after his repeat imaging to discuss results and plan moving forward.  Durenda Hurt, NP 04/15/2020 3:20 PM

## 2020-04-15 NOTE — ED Triage Notes (Signed)
Pt states that he is here to have his staples removed from the side of his head

## 2020-04-15 NOTE — ED Provider Notes (Signed)
[facility] Emergency Department Provider Note   ____________________________________________   First MD Initiated Contact with Patient 04/15/20 1046     (approximate)  I have reviewed the triage vital signs and the nursing notes.   HISTORY  Chief Complaint Suture / Staple Removal   HPI Jeffery Reilly is a 74 y.o. male presents to the ED for staple removal.  Patient was seen on 04/02/2020 for a laceration to his scalp that required staples.  Patient states he has not had any problems with it and there is no pain or drainage.         Past Medical History:  Diagnosis Date  . Stroke [facility])     Patient Active Problem List   Diagnosis Date Noted  . Pseudobulbar affect   . Dysphagia due to recent stroke   . Right middle cerebral artery stroke (HCC) 11/03/2019  . Stroke [facility]) 10/30/2019    Past Surgical History:  Procedure Laterality Date  . ABDOMINAL SURGERY    . IR CT HEAD LTD  10/30/2019  . IR INTRA CRAN STENT  10/30/2019  . IR PERCUTANEOUS ART THROMBECTOMY/INFUSION INTRACRANIAL INC DIAG ANGIO  10/30/2019  . IR US GUIDE VASC ACCESS RIGHT  10/30/2019  . RADIOLOGY WITH ANESTHESIA N/A 10/30/2019   Procedure: IR WITH ANESTHESIA;  Surgeon: Gilmer Mor, DO;  Location: [facility] OR;  Service: Radiology;  Laterality: N/A;    Prior to Admission medications   Medication Sig Start Date End Date Taking? Authorizing Provider  acetaminophen (TYLENOL) 325 MG tablet Take 2 tablets (650 mg total) by mouth every 4 (four) hours as needed for mild pain (or temp > 37.5 C (99.5 F)). 11/15/19   Angiulli, Mcarthur Rossetti, PA-C  aspirin 81 MG chewable tablet Chew 1 tablet (81 mg total) by mouth daily. 11/15/19   Angiulli, Mcarthur Rossetti, PA-C  atorvastatin (LIPITOR) 80 MG tablet Take 1 tablet (80 mg total) by mouth daily. 11/15/19   Angiulli, Mcarthur Rossetti, PA-C  clopidogrel (PLAVIX) 75 MG tablet Take 1 tablet (75 mg total) by mouth daily. 11/15/19   Angiulli, Mcarthur Rossetti, PA-C  Dextromethorphan-quiNIDine  (NUEDEXTA) 20-10 MG capsule Take 1 capsule by mouth daily. 11/17/19   Ranelle Oyster, MD  levETIRAcetam (KEPPRA) 500 MG tablet Take 1 tablet (500 mg total) by mouth 2 (two) times daily. 11/15/19   Angiulli, Mcarthur Rossetti, PA-C  Multiple Vitamin (MULTIVITAMIN WITH MINERALS) TABS tablet Take 1 tablet by mouth daily. 11/15/19   Angiulli, Mcarthur Rossetti, PA-C    Allergies Patient has no known allergies.  Family History  Problem Relation Age of Onset  . Hypertension Mother   . Hypertension Father     Social History Social History   Tobacco Use  . Smoking status: Former Games developer  . Smokeless tobacco: Never Used  Vaping Use  . Vaping Use: Never used  Substance Use Topics  . Alcohol use: Never  . Drug use: Never    Review of Systems Constitutional: No fever/chills ENT: No sore throat. Cardiovascular: Denies chest pain. Respiratory: Denies shortness of breath. Musculoskeletal: Negative for back pain. Skin: Staples posterior scalp. Neurological: Negative for headaches, focal weakness or numbness.  ____________________________________________   PHYSICAL EXAM:  VITAL SIGNS: ED Triage Vitals  Enc Vitals Group     BP 04/15/20 1030 139/76     Pulse Rate 04/15/20 1030 78     Resp 04/15/20 1030 16     Temp 04/15/20 1030 98.5 F (36.9 C)     Temp Source 04/15/20 1030 Oral  SpO2 04/15/20 1030 100 %     Weight 04/15/20 1028 159 lb 13.3 oz (72.5 kg)     Height 04/15/20 1028 5\' 9"  (1.753 m)     Head Circumference --      Peak Flow --      Pain Score 04/15/20 1027 0     Pain Loc --      Pain Edu? --      Excl. in GC? --     Constitutional: Alert and oriented. Well appearing and in no acute distress. Eyes: Conjunctivae are normal. PERRL. EOMI. Head: Atraumatic. Neck: No stridor.   Cardiovascular: Normal rate, regular rhythm. Grossly normal heart sounds.  Good peripheral circulation. Respiratory: Normal respiratory effort.  No retractions. Lungs CTAB. Skin:  Skin is warm, dry and intact.   No signs of infection at staple site. Psychiatric: Mood and affect are normal. Speech and behavior are normal.  ____________________________________________   PROCEDURES  Procedure(s) performed (including Critical Care):  .Suture Removal  Date/Time: 04/15/2020 11:01 AM Performed by: 13/07/2019, PA-C Authorized by: Tommi Rumps, PA-C   Consent:    Consent obtained:  Verbal   Consent given by:  Patient   Risks discussed:  Pain Location:    Location:  Head/neck   Head/neck location:  Scalp Procedure details:    Wound appearance:  No signs of infection and clean   Number of staples removed:  7 Post-procedure details:    Post-removal:  No dressing applied   Patient tolerance of procedure:  Tolerated well, no immediate complications     ____________________________________________   INITIAL IMPRESSION / ASSESSMENT AND PLAN / ED COURSE  As part of my medical decision making, I reviewed the following data within the electronic MEDICAL RECORD NUMBER Notes from prior ED visits and [place] Controlled Substance Database  74 year old male presents to the ED for staple removal.  He denies any complications or any drainage from the area.  Staples were removed by provider without any difficulty and patient was discharged with instructions to continue keeping the area clean and dry.  ____________________________________________   FINAL CLINICAL IMPRESSION(S) / ED DIAGNOSES  Final diagnoses:  Encounter for staple removal     ED Discharge Orders    None      *Please note:  Jeffery Reilly was evaluated in Emergency Department on 04/15/2020 for the symptoms described in the history of present illness. He was evaluated in the context of the global COVID-19 pandemic, which necessitated consideration that the patient might be at risk for infection with the SARS-CoV-2 virus that causes COVID-19. Institutional protocols and algorithms that pertain to the evaluation of patients at risk  for COVID-19 are in a state of rapid change based on information released by regulatory bodies including the CDC and federal and state organizations. These policies and algorithms were followed during the patient's care in the ED.  Some ED evaluations and interventions may be delayed as a result of limited staffing during and the pandemic.*   Note:  This document was prepared using Dragon voice recognition software and may include unintentional dictation errors.    13/07/2019, PA-C 04/15/20 1107    13/02/21, MD 04/15/20 1539

## 2020-04-15 NOTE — Discharge Instructions (Addendum)
Follow-up with your primary care provider if any continued concerns or questions about possible infection.  Continue to keep the area clean and dry.

## 2020-04-15 NOTE — ED Triage Notes (Deleted)
Pt comes into the ED via POV c/o pelvic pain, increased urination, and spotting.  Pt in NAD at this time.  States she isnt supposed to be on her cycle for another 15 days and there is a chance she could be pregnant.

## 2020-04-18 ENCOUNTER — Telehealth: Payer: Self-pay | Admitting: *Deleted

## 2020-04-18 NOTE — Telephone Encounter (Signed)
Spoke with pt's wife to review upcoming appts scheduled for patient in the lung nodule clinic. Pt's wife requested that appts be mailed to them and they will call back if need to be rescheduled. Appts mailed per request.

## 2020-06-22 ENCOUNTER — Emergency Department: Payer: Medicare Other

## 2020-06-22 ENCOUNTER — Other Ambulatory Visit: Payer: Self-pay

## 2020-06-22 ENCOUNTER — Emergency Department
Admission: EM | Admit: 2020-06-22 | Discharge: 2020-06-22 | Disposition: A | Discharge status: Home / Self Care | Payer: Medicare Other | Attending: Student in an Organized Health Care Education/Training Program | Admitting: Student in an Organized Health Care Education/Training Program

## 2020-06-22 DIAGNOSIS — Z87891 Personal history of nicotine dependence: Secondary | ICD-10-CM | POA: Diagnosis not present

## 2020-06-22 DIAGNOSIS — R509 Fever, unspecified: Secondary | ICD-10-CM | POA: Diagnosis present

## 2020-06-22 DIAGNOSIS — Z7982 Long term (current) use of aspirin: Secondary | ICD-10-CM | POA: Diagnosis not present

## 2020-06-22 DIAGNOSIS — N3000 Acute cystitis without hematuria: Secondary | ICD-10-CM | POA: Diagnosis not present

## 2020-06-22 DIAGNOSIS — U071 COVID-19: Secondary | ICD-10-CM | POA: Diagnosis not present

## 2020-06-22 DIAGNOSIS — N3090 Cystitis, unspecified without hematuria: Secondary | ICD-10-CM

## 2020-06-22 LAB — CBC
HCT: 39.7 % (ref 39.0–52.0)
Hemoglobin: 12.5 g/dL — ABNORMAL LOW (ref 13.0–17.0)
MCH: 27.1 pg (ref 26.0–34.0)
MCHC: 31.5 g/dL (ref 30.0–36.0)
MCV: 85.9 fL (ref 80.0–100.0)
Platelets: 149 10*3/uL — ABNORMAL LOW (ref 150–400)
RBC: 4.62 MIL/uL (ref 4.22–5.81)
RDW: 13.6 % (ref 11.5–15.5)
WBC: 5.7 10*3/uL (ref 4.0–10.5)
nRBC: 0 % (ref 0.0–0.2)

## 2020-06-22 LAB — URINALYSIS, COMPLETE (UACMP) WITH MICROSCOPIC
Bacteria, UA: NONE SEEN
Bilirubin Urine: NEGATIVE
Glucose, UA: NEGATIVE mg/dL
Ketones, ur: NEGATIVE mg/dL
Nitrite: NEGATIVE
Protein, ur: 100 mg/dL — AB
Specific Gravity, Urine: 1.02 (ref 1.005–1.030)
pH: 5 (ref 5.0–8.0)

## 2020-06-22 LAB — COMPREHENSIVE METABOLIC PANEL
ALT: 33 U/L (ref 0–44)
AST: 30 U/L (ref 15–41)
Albumin: 3.7 g/dL (ref 3.5–5.0)
Alkaline Phosphatase: 73 U/L (ref 38–126)
Anion gap: 10 (ref 5–15)
BUN: 21 mg/dL (ref 8–23)
CO2: 24 mmol/L (ref 22–32)
Calcium: 9.4 mg/dL (ref 8.9–10.3)
Chloride: 103 mmol/L (ref 98–111)
Creatinine, Ser: 1.18 mg/dL (ref 0.61–1.24)
GFR, Estimated: >60 mL/min (ref >60)
Glucose, Bld: 127 mg/dL — ABNORMAL HIGH (ref 70–99)
Potassium: 3.8 mmol/L (ref 3.5–5.1)
Sodium: 137 mmol/L (ref 135–145)
Total Bilirubin: 1 mg/dL (ref 0.3–1.2)
Total Protein: 7.5 g/dL (ref 6.5–8.1)

## 2020-06-22 MED ORDER — CEPHALEXIN 500 MG PO CAPS
500.0000 mg | ORAL_CAPSULE | Freq: Once | ORAL | Status: AC
  Administered 2020-06-22: 500 mg via ORAL
  Filled 2020-06-22: qty 1

## 2020-06-22 MED ORDER — CEPHALEXIN 500 MG PO CAPS: 500.0000 mg | ORAL_CAPSULE | Freq: Three times a day (TID) | ORAL | 0 refills | Status: AC

## 2020-06-22 MED ORDER — PROBIOTIC 250 MG PO CAPS: 1.0000 | ORAL_CAPSULE | Freq: Two times a day (BID) | ORAL | 0 refills | Status: AC

## 2020-06-22 NOTE — ED Triage Notes (Signed)
Patient to ED via [facility] for nonspecific complaints of being hot. EMS obtained temperature of 101.3 orally and gave 970 mg TYlenol enroute. Patient is normally difficult to understand due to previous stroke but does state he is in no pain - states "My house is hot." Requested that his wife call 911 so he could come to the hospital. No other specific complaint.

## 2020-06-22 NOTE — ED Provider Notes (Signed)
[facility] Emergency Department Provider Note    Event Date/Time   First MD Initiated Contact with Patient 06/22/20 1126     (approximate)  I have reviewed the triage vital signs and the nursing notes.   HISTORY  Chief Complaint Fever   HPI Jeffery Reilly is a 75 y.o. male the below listed past medical history presents to the ER for evaluation of fever.  Has had some congestion.  Wife also reportedly having URI symptoms.  Denies any cough or chest pain.  No nausea or vomiting.  No recent antibiotics.  Denies any discomfort.  States he otherwise feels well right now.    Past Medical History:  Diagnosis Date  . Stroke [facility])    Family History  Problem Relation Age of Onset  . Hypertension Mother   . Hypertension Father    Past Surgical History:  Procedure Laterality Date  . ABDOMINAL SURGERY    . IR CT HEAD LTD  10/30/2019  . IR INTRA CRAN STENT  10/30/2019  . IR PERCUTANEOUS ART THROMBECTOMY/INFUSION INTRACRANIAL INC DIAG ANGIO  10/30/2019  . IR US GUIDE VASC ACCESS RIGHT  10/30/2019  . RADIOLOGY WITH ANESTHESIA N/A 10/30/2019   Procedure: IR WITH ANESTHESIA;  Surgeon: Gilmer Mor, DO;  Location: [facility] OR;  Service: Radiology;  Laterality: N/A;   Patient Active Problem List   Diagnosis Date Noted  . Pseudobulbar affect   . Dysphagia due to recent stroke   . Right middle cerebral artery stroke (HCC) 11/03/2019  . Stroke (HCC) 10/30/2019      Prior to Admission medications   Medication Sig Start Date End Date Taking? Authorizing Provider  cephALEXin (KEFLEX) 500 MG capsule Take 1 capsule (500 mg total) by mouth 3 (three) times daily for 7 days. 06/22/20 06/29/20 Yes Willy Eddy, MD  Saccharomyces boulardii (PROBIOTIC) 250 MG CAPS Take 1 capsule by mouth in the morning and at bedtime. 06/22/20  Yes Willy Eddy, MD  acetaminophen (TYLENOL) 325 MG tablet Take 2 tablets (650 mg total) by mouth every 4 (four) hours as needed for mild pain (or  temp > 37.5 C (99.5 F)). 11/15/19   Angiulli, Mcarthur Rossetti, PA-C  aspirin 81 MG chewable tablet Chew 1 tablet (81 mg total) by mouth daily. 11/15/19   Angiulli, Mcarthur Rossetti, PA-C  atorvastatin (LIPITOR) 80 MG tablet Take 1 tablet (80 mg total) by mouth daily. 11/15/19   Angiulli, Mcarthur Rossetti, PA-C  clopidogrel (PLAVIX) 75 MG tablet Take 1 tablet (75 mg total) by mouth daily. 11/15/19   Angiulli, Mcarthur Rossetti, PA-C  Dextromethorphan-quiNIDine (NUEDEXTA) 20-10 MG capsule Take 1 capsule by mouth daily. 11/17/19   Ranelle Oyster, MD  levETIRAcetam (KEPPRA) 500 MG tablet Take 1 tablet (500 mg total) by mouth 2 (two) times daily. 11/15/19   Angiulli, Mcarthur Rossetti, PA-C  Multiple Vitamin (MULTIVITAMIN WITH MINERALS) TABS tablet Take 1 tablet by mouth daily. 11/15/19   Angiulli, Mcarthur Rossetti, PA-C    Allergies Patient has no known allergies.    Social History Social History   Tobacco Use  . Smoking status: Former Games developer  . Smokeless tobacco: Never Used  Vaping Use  . Vaping Use: Never used  Substance Use Topics  . Alcohol use: Never  . Drug use: Never    Review of Systems Patient denies headaches, rhinorrhea, blurry vision, numbness, shortness of breath, chest pain, edema, cough, abdominal pain, nausea, vomiting, diarrhea, dysuria, fevers, rashes or hallucinations unless otherwise stated above in HPI. ____________________________________________   PHYSICAL  EXAM:  VITAL SIGNS: Vitals:   06/22/20 0745 06/22/20 1102  BP: 130/69 112/66  Pulse: 96 67  Resp: 20 20  Temp: (!) 101.2 F (38.4 C) 99.4 F (37.4 C)  SpO2: 100% 100%    Constitutional: Alert and oriented.  Eyes: Conjunctivae are normal.  Head: Atraumatic. Nose: No congestion/rhinnorhea. Mouth/Throat: Mucous membranes are moist.   Neck: No stridor. Painless ROM.  Cardiovascular: Normal rate, regular rhythm. Grossly normal heart sounds.  Good peripheral circulation. Respiratory: Normal respiratory effort.  No retractions. Lungs CTAB. Gastrointestinal:  Soft and nontender. No distention. No abdominal bruits. No CVA tenderness. Genitourinary:  Musculoskeletal: No lower extremity tenderness nor edema.  No joint effusions. Neurologic:  Dysarthric,  No new focal neurologic deficits are appreciated. No facial droop Skin:  Skin is warm, dry and intact. No rash noted. Psychiatric: Mood and affect are normal. Speech and behavior are normal.  ____________________________________________   LABS (all labs ordered are listed, but only abnormal results are displayed)  Results for orders placed or performed during the hospital encounter of 06/22/20 (from the past 24 hour(s))  CBC     Status: Abnormal   Collection Time: 06/22/20  8:09 AM  Result Value Ref Range   WBC 5.7 4.0 - 10.5 K/uL   RBC 4.62 4.22 - 5.81 MIL/uL   Hemoglobin 12.5 (L) 13.0 - 17.0 g/dL   HCT 96.0 45.4 - 09.8 %   MCV 85.9 80.0 - 100.0 fL   MCH 27.1 26.0 - 34.0 pg   MCHC 31.5 30.0 - 36.0 g/dL   RDW 11.9 14.7 - 82.9 %   Platelets 149 (L) 150 - 400 K/uL   nRBC 0.0 0.0 - 0.2 %  Comprehensive metabolic panel     Status: Abnormal   Collection Time: 06/22/20  8:09 AM  Result Value Ref Range   Sodium 137 135 - 145 mmol/L   Potassium 3.8 3.5 - 5.1 mmol/L   Chloride 103 98 - 111 mmol/L   CO2 24 22 - 32 mmol/L   Glucose, Bld 127 (H) 70 - 99 mg/dL   BUN 21 8 - 23 mg/dL   Creatinine, Ser 5.62 0.61 - 1.24 mg/dL   Calcium 9.4 8.9 - 13.0 mg/dL   Total Protein 7.5 6.5 - 8.1 g/dL   Albumin 3.7 3.5 - 5.0 g/dL   AST 30 15 - 41 U/L   ALT 33 0 - 44 U/L   Alkaline Phosphatase 73 38 - 126 U/L   Total Bilirubin 1.0 0.3 - 1.2 mg/dL   GFR, Estimated >86 >57 mL/min   Anion gap 10 5 - 15  Urinalysis, Complete w Microscopic Urine, Unspecified Source     Status: Abnormal   Collection Time: 06/22/20  1:11 PM  Result Value Ref Range   Color, Urine YELLOW (A) YELLOW   APPearance CLOUDY (A) CLEAR   Specific Gravity, Urine 1.020 1.005 - 1.030   pH 5.0 5.0 - 8.0   Glucose, UA NEGATIVE NEGATIVE  mg/dL   Hgb urine dipstick LARGE (A) NEGATIVE   Bilirubin Urine NEGATIVE NEGATIVE   Ketones, ur NEGATIVE NEGATIVE mg/dL   Protein, ur 846 (A) NEGATIVE mg/dL   Nitrite NEGATIVE NEGATIVE   Leukocytes,Ua SMALL (A) NEGATIVE   RBC / HPF 0-5 0 - 5 RBC/hpf   WBC, UA 21-50 0 - 5 WBC/hpf   Bacteria, UA NONE SEEN NONE SEEN   Squamous Epithelial / LPF 0-5 0 - 5   Mucus PRESENT    ____________________________________________  EKG My review and  personal interpretation at Time: 7:57   Indication: fever  Rate: 90  Rhythm: sinus Axis: normal Other: normal intervals, no stemi ____________________________________________  RADIOLOGY  I personally reviewed all radiographic images ordered to evaluate for the above acute complaints and reviewed radiology reports and findings.  These findings were personally discussed with the patient.  Please see medical record for radiology report.  ____________________________________________   PROCEDURES  Procedure(s) performed:  Procedures    Critical Care performed: no ____________________________________________   INITIAL IMPRESSION / ASSESSMENT AND PLAN / ED COURSE  Pertinent labs & imaging results that were available during my care of the patient were reviewed by me and considered in my medical decision making (see chart for details).   DDX: uri, covid, uti, pna, sepsis  Jahmir Salo is a 75 y.o. who presents to the ED with presentation as described above.  Patient febrile but nontoxic-appearing.  He has no complaints other than some congestion.  Will test for Covid.  No white count.  No other signs of sirs criteria.  Does not appear septic.  Abdominal exam soft and benign.  No skin lesions to suggest cellulitis.  Clinical Course as of 06/22/20 1327  Wynelle Link Jun 22, 2020  1324 Patient's urinalysis does appear dirty given his fever I am going to cover with antibiotics.  He is not showing any persistent fevers he is not septic I think he is appropriate  for outpatient management.  Patient agreeable to plan. [PR]    Clinical Course User Index [PR] Willy Eddy, MD    The patient was evaluated in Emergency Department today for the symptoms described in the history of present illness. He/she was evaluated in the context of the global COVID-19 pandemic, which necessitated consideration that the patient might be at risk for infection with the SARS-CoV-2 virus that causes COVID-19. Institutional protocols and algorithms that pertain to the evaluation of patients at risk for COVID-19 are in a state of rapid change based on information released by regulatory bodies including the CDC and federal and state organizations. These policies and algorithms were followed during the patient's care in the ED.  As part of my medical decision making, I reviewed the following data within the electronic MEDICAL RECORD NUMBER Nursing notes reviewed and incorporated, Labs reviewed, notes from prior ED visits and [place] Controlled Substance Database   ____________________________________________   FINAL CLINICAL IMPRESSION(S) / ED DIAGNOSES  Final diagnoses:  Fever, unspecified fever cause  Cystitis      NEW MEDICATIONS STARTED DURING THIS VISIT:  New Prescriptions   CEPHALEXIN (KEFLEX) 500 MG CAPSULE    Take 1 capsule (500 mg total) by mouth 3 (three) times daily for 7 days.   SACCHAROMYCES BOULARDII (PROBIOTIC) 250 MG CAPS    Take 1 capsule by mouth in the morning and at bedtime.     Note:  This document was prepared using Dragon voice recognition software and may include unintentional dictation errors.    Willy Eddy, MD 06/22/20 1327

## 2020-06-23 LAB — SARS CORONAVIRUS 2 (TAT 6-24 HRS): SARS Coronavirus 2: POSITIVE — AB

## 2020-06-24 ENCOUNTER — Telehealth: Payer: Self-pay

## 2020-06-24 NOTE — Telephone Encounter (Signed)
I connected by phone with Jeffery Reilly on 06/24/2020 at 9:56 AM to discuss the potential use of a new treatment for mild to moderate COVID-19 viral infection in non-hospitalized patients.  This patient is a 75 y.o. male that meets the FDA criteria for Emergency Use Authorization of COVID monoclonal antibody sotrovimab.  Has a (+) direct SARS-CoV-2 viral test result  Has mild or moderate COVID-19   Is NOT hospitalized due to COVID-19  Is within 10 days of symptom onset  Has at least one of the high risk factor(s) for progression to severe COVID-19 and/or hospitalization as defined in EUA.  Specific high risk criteria : Older age (>/= 75 yo)   I have spoken and communicated the following to the patient or parent/caregiver regarding COVID monoclonal antibody treatment:  1. FDA has authorized the emergency use for the treatment of mild to moderate COVID-19 in adults and pediatric patients with positive results of direct SARS-CoV-2 viral testing who are 23 years of age and older weighing at least 40 kg, and who are at high risk for progressing to severe COVID-19 and/or hospitalization.  2. The significant known and potential risks and benefits of COVID monoclonal antibody, and the extent to which such potential risks and benefits are unknown.  3. Information on available alternative treatments and the risks and benefits of those alternatives, including clinical trials.  4. Patients treated with COVID monoclonal antibody should continue to self-isolate and use infection control measures (e.g., wear mask, isolate, social distance, avoid sharing personal items, clean and disinfect "high touch" surfaces, and frequent handwashing) according to CDC guidelines.   5. The patient or parent/caregiver has the option to accept or refuse COVID monoclonal antibody treatment.  After reviewing this information with the patient, the patient has DECLINED offer to receive the infusion. Esther Hardy,  RN 06/24/2020 9:56 AM

## 2020-07-14 ENCOUNTER — Ambulatory Visit: Payer: Medicare Other

## 2020-07-14 ENCOUNTER — Telehealth: Payer: Self-pay | Admitting: *Deleted

## 2020-07-14 NOTE — Telephone Encounter (Signed)
Pt did not show for CT scan this morning. Appts for the lung nodule clinic have been rescheduled. Spoke with pt who requested that I call back later this week to confirm appts when his wife is available. Currently, pt's wife is admitted in the hospital. Will call back later to discuss with wife when available.

## 2020-07-15 ENCOUNTER — Inpatient Hospital Stay: Payer: Medicare Other | Admitting: Oncology

## 2020-07-18 ENCOUNTER — Telehealth: Payer: Self-pay | Admitting: *Deleted

## 2020-07-18 NOTE — Telephone Encounter (Signed)
Spoke with pt's wife who stated that pt is not interested in follow up of his lung nodule and that she cannot make him attend appts scheduled. Pt's wife requests that we cancel his appts at this time. Informed pt's wife that will cancel his appts and notify his PCP for further follow up. See last CT report below from 04/02/20:  CLINICAL DATA:  Head trauma, moderate/severe. Neck trauma. Additional history provided: Fall, head laceration. Patient on Plavix.  EXAM: CT HEAD WITHOUT CONTRAST  CT CERVICAL SPINE WITHOUT CONTRAST  TECHNIQUE: Multidetector CT imaging of the head and cervical spine was performed following the standard protocol without intravenous contrast. Multiplanar CT image reconstructions of the cervical spine were also generated.  COMPARISON:  MRI/MRA head 10/31/2019, head CT 10/30/2019, CTA head/neck 10/30/2019.  FINDINGS: CT HEAD FINDINGS  Brain:  Generalized cerebral atrophy.  Known small chronic multifocal right MCA territory infarcts were better appreciated on the brain MRI of 10/31/2019 (acute at that time).  Redemonstrated chronic infarcts within the bilateral corona radiata, deep gray nuclei and within the pons. Associated ex vacuo dilatation of the left lateral ventricle.  Background advanced ill-defined hypoattenuation within the cerebral white matter which is nonspecific, but compatible with chronic small vessel ischemic disease.  There is no acute intracranial hemorrhage.  No acute demarcated cortical infarct is identified.  No extra-axial fluid collection.  No evidence of intracranial mass.  No midline shift.  Vascular: No hyperdense vessel. Redemonstrated stent is present within the M1 right middle cerebral artery. Atherosclerotic calcifications.  Skull: Normal. Negative for fracture or focal lesion.  Sinuses/Orbits: Visualized orbits show no acute finding. Moderate mucosal thickening within the right frontal sinus.  Frothy secretions and small air-fluid level within the left sphenoid sinus. No significant mastoid effusion.  Other: Left parietal scalp and laceration.  CT CERVICAL SPINE FINDINGS  Alignment: Straightening of the expected cervical lordosis. Trace C2-C3 grade 1 retrolisthesis.  Skull base and vertebrae: The basion-dental and atlanto-dental intervals are maintained.No evidence of acute fracture to the cervical spine.  Soft tissues and spinal canal: No prevertebral fluid or swelling. No visible canal hematoma.  Disc levels: Cervical spondylosis with multilevel disc space narrowing, disc bulges, posterior disc osteophytes and uncovertebral hypertrophy. Disc space narrowing is moderate/severe at C3-C4 and C6-C7. A C6-C7 posterior disc osteophyte complex likely contributes to at least moderate spinal canal stenosis. Multilevel bony neural foraminal narrowing greatest bilaterally at C3-C4 and C6-C7.  Upper chest: 9 mm right upper lobe pulmonary nodule (series 3, image 81) (series 7, image 20).  IMPRESSION: CT head:  1. No evidence of acute intracranial abnormality. 2. Left parietal scalp hematoma and laceration. 3. Generalized cerebral atrophy and chronic ischemic disease with multiple chronic infarcts, as outlined and stable from the MRI brain of 10/31/2019. 4. Status post stenting of the M1 right middle cerebral artery. 5. Moderate right frontal sinus mucosal thickening. 6. Left sphenoid sinusitis.  CT cervical spine:  1. No evidence of acute fracture to the cervical spine. 2. Trace C3-C4 grade 1 retrolisthesis. 3. Cervical spondylosis as described and greatest at C3-C4 and C6-C7. 4. 9 mm right upper lobe pulmonary nodule. Consider one of the following for both low-risk and high-risk individuals: (A) repeat chest CT in 3 months, (B) PET-CT for further evaluation, or (C) tissue sampling. This recommendation follows the consensus statement: Guidelines for  Management of Incidental Pulmonary Nodules Detected on CT Images: From the Fleischner Society 2017; Radiology 2017; 284:228-243.   Electronically Signed   By: Jackey Loge DO  On: 04/02/2020 15:40

## 2020-07-23 ENCOUNTER — Other Ambulatory Visit: Payer: Medicare Other

## 2020-07-30 ENCOUNTER — Ambulatory Visit: Payer: Medicare Other | Admitting: Oncology

## 2020-08-05 ENCOUNTER — Emergency Department
Admission: EM | Admit: 2020-08-05 | Discharge: 2020-08-05 | Disposition: A | Discharge status: Home / Self Care | Payer: Medicare Other | Attending: Emergency Medicine | Admitting: Emergency Medicine

## 2020-08-05 ENCOUNTER — Emergency Department: Payer: Medicare Other

## 2020-08-05 ENCOUNTER — Other Ambulatory Visit: Payer: Self-pay

## 2020-08-05 DIAGNOSIS — R600 Localized edema: Secondary | ICD-10-CM

## 2020-08-05 DIAGNOSIS — M7989 Other specified soft tissue disorders: Secondary | ICD-10-CM | POA: Diagnosis present

## 2020-08-05 DIAGNOSIS — Z7902 Long term (current) use of antithrombotics/antiplatelets: Secondary | ICD-10-CM | POA: Diagnosis not present

## 2020-08-05 DIAGNOSIS — R4781 Slurred speech: Secondary | ICD-10-CM | POA: Insufficient documentation

## 2020-08-05 DIAGNOSIS — R5383 Other fatigue: Secondary | ICD-10-CM | POA: Insufficient documentation

## 2020-08-05 DIAGNOSIS — Z87891 Personal history of nicotine dependence: Secondary | ICD-10-CM | POA: Diagnosis not present

## 2020-08-05 DIAGNOSIS — Z7982 Long term (current) use of aspirin: Secondary | ICD-10-CM | POA: Diagnosis not present

## 2020-08-05 LAB — BASIC METABOLIC PANEL
Anion gap: 7 (ref 5–15)
BUN: 16 mg/dL (ref 8–23)
CO2: 26 mmol/L (ref 22–32)
Calcium: 9.4 mg/dL (ref 8.9–10.3)
Chloride: 105 mmol/L (ref 98–111)
Creatinine, Ser: 0.96 mg/dL (ref 0.61–1.24)
GFR, Estimated: >60 mL/min (ref >60)
Glucose, Bld: 122 mg/dL — ABNORMAL HIGH (ref 70–99)
Potassium: 3.9 mmol/L (ref 3.5–5.1)
Sodium: 138 mmol/L (ref 135–145)

## 2020-08-05 LAB — URINALYSIS, COMPLETE (UACMP) WITH MICROSCOPIC
Bacteria, UA: NONE SEEN
Bilirubin Urine: NEGATIVE
Glucose, UA: NEGATIVE mg/dL
Ketones, ur: NEGATIVE mg/dL
Nitrite: NEGATIVE
Protein, ur: NEGATIVE mg/dL
Specific Gravity, Urine: 1.012 (ref 1.005–1.030)
Squamous Epithelial / HPF: NONE SEEN (ref 0–5)
pH: 5 (ref 5.0–8.0)

## 2020-08-05 LAB — CBC
HCT: 36.5 % — ABNORMAL LOW (ref 39.0–52.0)
Hemoglobin: 11.5 g/dL — ABNORMAL LOW (ref 13.0–17.0)
MCH: 27.4 pg (ref 26.0–34.0)
MCHC: 31.5 g/dL (ref 30.0–36.0)
MCV: 86.9 fL (ref 80.0–100.0)
Platelets: 161 10*3/uL (ref 150–400)
RBC: 4.2 MIL/uL — ABNORMAL LOW (ref 4.22–5.81)
RDW: 14.1 % (ref 11.5–15.5)
WBC: 4.9 10*3/uL (ref 4.0–10.5)
nRBC: 0 % (ref 0.0–0.2)

## 2020-08-05 LAB — TROPONIN I (HIGH SENSITIVITY): Troponin I (High Sensitivity): 10 ng/L (ref <18)

## 2020-08-05 LAB — BRAIN NATRIURETIC PEPTIDE: B Natriuretic Peptide: 48.6 pg/mL (ref 0.0–100.0)

## 2020-08-05 NOTE — ED Provider Notes (Signed)
[facility] Emergency Department Provider Note   ____________________________________________   Event Date/Time   First MD Initiated Contact with Patient 08/05/20 1517     (approximate)  I have reviewed the triage vital signs and the nursing notes.   HISTORY  Chief Complaint Weakness and Loss of Consciousness    HPI Jeffery Reilly is a 75 y.o. male the history of previous ischemic stroke  Patient presents today states that for the last several days has been having some trouble getting about with mobility due to swelling in his legs.  Additionally, its come to the point now he tried to get himself out of his recliner and could not support himself or walk about using his walker because he is just feels so weak in his legs.  Reports his legs feel heavy and swollen making it difficult for him to get about  No chest pain or shortness of breath.  Denies any changes in his speech, no headaches.  He has slurred speech at baseline and reports no change  He also has weakness in his lower extremities from a previous stroke that he reports no new obvious focal weakness but does read report he just feels so heavy in the legs is making him to the point he cannot walk     Past Medical History:  Diagnosis Date  . Stroke [facility])     Patient Active Problem List   Diagnosis Date Noted  . Pseudobulbar affect   . Dysphagia due to recent stroke   . Right middle cerebral artery stroke (HCC) 11/03/2019  . Stroke [facility]) 10/30/2019    Past Surgical History:  Procedure Laterality Date  . ABDOMINAL SURGERY    . IR CT HEAD LTD  10/30/2019  . IR INTRA CRAN STENT  10/30/2019  . IR PERCUTANEOUS ART THROMBECTOMY/INFUSION INTRACRANIAL INC DIAG ANGIO  10/30/2019  . IR US GUIDE VASC ACCESS RIGHT  10/30/2019  . RADIOLOGY WITH ANESTHESIA N/A 10/30/2019   Procedure: IR WITH ANESTHESIA;  Surgeon: Gilmer Mor, DO;  Location: [facility] OR;  Service: Radiology;  Laterality: N/A;    Prior to  Admission medications   Medication Sig Start Date End Date Taking? Authorizing Provider  acetaminophen (TYLENOL) 325 MG tablet Take 2 tablets (650 mg total) by mouth every 4 (four) hours as needed for mild pain (or temp > 37.5 C (99.5 F)). 11/15/19   Angiulli, Mcarthur Rossetti, PA-C  aspirin 81 MG chewable tablet Chew 1 tablet (81 mg total) by mouth daily. 11/15/19   Angiulli, Mcarthur Rossetti, PA-C  atorvastatin (LIPITOR) 80 MG tablet Take 1 tablet (80 mg total) by mouth daily. 11/15/19   Angiulli, Mcarthur Rossetti, PA-C  clopidogrel (PLAVIX) 75 MG tablet Take 1 tablet (75 mg total) by mouth daily. 11/15/19   Angiulli, Mcarthur Rossetti, PA-C  Dextromethorphan-quiNIDine (NUEDEXTA) 20-10 MG capsule Take 1 capsule by mouth daily. 11/17/19   Ranelle Oyster, MD  levETIRAcetam (KEPPRA) 500 MG tablet Take 1 tablet (500 mg total) by mouth 2 (two) times daily. 11/15/19   Angiulli, Mcarthur Rossetti, PA-C  Multiple Vitamin (MULTIVITAMIN WITH MINERALS) TABS tablet Take 1 tablet by mouth daily. 11/15/19   Angiulli, Mcarthur Rossetti, PA-C  Saccharomyces boulardii (PROBIOTIC) 250 MG CAPS Take 1 capsule by mouth in the morning and at bedtime. 06/22/20   Willy Eddy, MD    Allergies Patient has no known allergies.  Family History  Problem Relation Age of Onset  . Hypertension Mother   . Hypertension Father     Social  History Social History   Tobacco Use  . Smoking status: Former Games developermoker  . Smokeless tobacco: Never Used  Vaping Use  . Vaping Use: Never used  Substance Use Topics  . Alcohol use: Never  . Drug use: Never    Review of Systems Constitutional: No fever/chills Eyes: No visual changes. ENT: No sore throat. Cardiovascular: Denies chest pain. Respiratory: Denies shortness of breath. Gastrointestinal: No abdominal pain.   Genitourinary: Negative for dysuria.  No trouble urinating. Musculoskeletal: Negative for back pain. Skin: Negative for rash. Neurological: Negative for headaches, areas of focal weakness or numbness except from his old  stroke but no change.    ____________________________________________   PHYSICAL EXAM:  VITAL SIGNS: ED Triage Vitals  Enc Vitals Group     BP 08/05/20 1148 129/82     Pulse Rate 08/05/20 1148 67     Resp 08/05/20 1148 18     Temp 08/05/20 1148 98.8 F (37.1 C)     Temp Source 08/05/20 1148 Oral     SpO2 08/05/20 1148 100 %     Weight 08/05/20 1149 180 lb (81.6 kg)     Height 08/05/20 1149 6\' 1"  (1.854 m)     Head Circumference --      Peak Flow --      Pain Score 08/05/20 1154 5     Pain Loc --      Pain Edu? --      Excl. in GC? --     Constitutional: Alert and oriented. Well appearing and in no acute distress.  He is pleasant.  Appears poorly conditioned.  Chronically ill in appearance.  His speech is very slurred but is intelligible Eyes: Conjunctivae are normal. Head: Atraumatic. Nose: No congestion/rhinnorhea. Mouth/Throat: Mucous membranes are moist. Neck: No stridor.  Cardiovascular: Normal rate, regular rhythm. Grossly normal heart sounds.  Good peripheral circulation. Respiratory: Normal respiratory effort.  No retractions. Lungs CTAB. Gastrointestinal: Soft and nontender. No distention. Musculoskeletal:  He demonstrates roughly 4-5 strength in all extremities upper without any obvious focal deficit.  Lower extremities demonstrate about 3-5 strength lower extremities bilateral, with associated moderate edema from the foot all the way up to the level of the calf bilateral pitting in nature Neurologic:  Normal speech and language. No gross focal neurologic deficits are appreciated.  Skin:  Skin is warm, dry and intact. No rash noted. Psychiatric: Mood and affect are normal. Speech and behavior are normal.  ____________________________________________   LABS (all labs ordered are listed, but only abnormal results are displayed)  Labs Reviewed  BASIC METABOLIC PANEL - Abnormal; Notable for the following components:      Result Value   Glucose, Bld 122 (*)     All other components within normal limits  CBC - Abnormal; Notable for the following components:   RBC 4.20 (*)    Hemoglobin 11.5 (*)    HCT 36.5 (*)    All other components within normal limits  URINALYSIS, COMPLETE (UACMP) WITH MICROSCOPIC - Abnormal; Notable for the following components:   Color, Urine YELLOW (*)    APPearance CLEAR (*)    Hgb urine dipstick SMALL (*)    Leukocytes,Ua MODERATE (*)    All other components within normal limits  URINE CULTURE  BRAIN NATRIURETIC PEPTIDE  CBG MONITORING, ED  TROPONIN I (HIGH SENSITIVITY)   ____________________________________________  EKG  ED ECG REPORT I, Sharyn CreamerMark Elsie Sakuma, the attending physician, personally viewed and interpreted this ECG.  Date: 08/05/2020 EKG Time: 1150 Rate: 75  Rhythm: normal sinus rhythm QRS Axis: normal Intervals: normal ST/T Wave abnormalities: normal Narrative Interpretation: no evidence of acute ischemia  ____________________________________________  RADIOLOGY  DG Chest 2 View  Result Date: 08/05/2020 CLINICAL DATA:  Edema and increased weakness in both legs, concern for CHF. EXAM: CHEST - 2 VIEW COMPARISON:  Chest radiograph June 22, 2020 FINDINGS: The heart size and mediastinal contours are within normal limits. No focal consolidation. No pleural effusion. No pneumothorax. The visualized skeletal structures are unremarkable. IMPRESSION: No active cardiopulmonary disease. Electronically Signed   By: Maudry Mayhew MD   On: 08/05/2020 16:32   CT Head Wo Contrast  Result Date: 08/05/2020 CLINICAL DATA:  Increasing bilateral leg weakness, slurred speech and weak grip with history of stroke. EXAM: CT HEAD WITHOUT CONTRAST TECHNIQUE: Contiguous axial images were obtained from the base of the skull through the vertex without intravenous contrast. COMPARISON:  Head CT April 02, 2020 FINDINGS: Brain: Age-related global parenchymal volume loss with ex vacuo dilatation of ventricular system not significantly  changed. Similar background of advanced chronic small vessel ischemic white matter disease. No significant change in the chronic infarcts within the bilateral corona radiata and deep gray nuclei and in the pons. Similar appearance of the small chronic multifocal right MCA territory infarcts. No evidence of acute large vascular territory infarction. No evidence of hemorrhage or extra-axial collection. Vascular: No hyperdense vessel. Again seen is a stent within the M1 right middle cerebral artery. Atherosclerotic calcifications. Skull: Normal. Negative for fracture or focal lesion. Sinuses/Orbits: Similar mucosal thickening in the right frontal sinus. Orbits are unremarkable. Other: None IMPRESSION: 1. No evidence of acute intracranial abnormality. Please of concern for acute infarction MRI is more sensitive. 2. Similar background of advanced chronic small vessel ischemic white matter disease and chronic infarcts. Electronically Signed   By: Maudry Mayhew MD   On: 08/05/2020 12:53   US Venous Img Lower Bilateral  Result Date: 08/05/2020 CLINICAL DATA:  Lower leg swelling EXAM: BILATERAL LOWER EXTREMITY VENOUS DOPPLER ULTRASOUND TECHNIQUE: Gray-scale sonography with compression, as well as color and duplex ultrasound, were performed to evaluate the deep venous system(s) from the level of the common femoral vein through the popliteal and proximal calf veins. COMPARISON:  None. FINDINGS: VENOUS Normal compressibility of the common femoral, superficial femoral, and popliteal veins. Calf veins are not well visualized. Visualized portions of profunda femoral vein and great saphenous vein unremarkable. No filling defects to suggest DVT on grayscale or color Doppler imaging. Doppler waveforms show normal direction of venous flow, normal respiratory plasticity and response to augmentation. OTHER None. Limitations: Calf veins not well visualized due to edema. IMPRESSION: No evidence of lower extremity DVT bilaterally,  noting poor visualization of calf veins. Electronically Signed   By: Guadlupe Spanish M.D.   On: 08/05/2020 16:46     CT head, chest x-ray, and DVT ultrasound all negative for acute pathologies. ____________________________________________   PROCEDURES  Procedure(s) performed: None  Procedures  Critical Care performed: No  ____________________________________________   INITIAL IMPRESSION / ASSESSMENT AND PLAN / ED COURSE  Pertinent labs & imaging results that were available during my care of the patient were reviewed by me and considered in my medical decision making (see chart for details).   Patient presents for weakness, seems to be bilateral involving his legs.  He does not have focal loss of motor function and he has normal sensation lower extremities as well as good perfusion of his feet bilaterally.  He does however appear to have generalized  weakness, perhaps muscular or I suspect that his increasing edema in his lower extremities could be to blame causing too much weight on top of deconditioning for him to be able to effectively move.  So far his labs appear reassuring including CBC and BMP without clear cause for edema, but will await BNP, troponin, and ultrasound lower extremities and chest x-ray for further evaluation.  Clinical Course as of 08/05/20 1823  Tue Aug 05, 2020  1543 Patient is not able to ambulate or transfer independently.  He seems to be hampered by the weight of his lower legs and edema.  Nursing reports he took 2 persons to assist him from chair to bed, patient only reports he is able to use a walker but legs feel too heavy or weak to be able to do so. [MQ]  1821 Patient able to get up and ambulate safely with a walker assisted by RN. [MQ]    Clinical Course User Index [MQ] Sharyn Creamer, MD   Reviewed the patient's medication list, and I do not see any frank cause of peripheral edema there either.  No calcium channel  blockers.  ----------------------------------------- 4:45 PM on 08/05/2020 -----------------------------------------  Urine sent for culture, no obvious UTI symptoms but given his fatigue will send for culture in the event this is positive I would recommend treatment.  ----------------------------------------- 6:23 PM on 08/05/2020 -----------------------------------------  Patient resting comfortably, has been able to ambulate with use of a walker safely.  This is to his baseline.  We will discharge the patient recommendation of follow-up with his primary for further evaluation of edema and fatigue.  I suspect slowly developing edema and deconditioning may be leading to increased difficulty with mobility.  I do not see at this point any indication for admission.  He does not have any acute focal neurologic symptoms to suggest central etiology, and appears appropriate for ongoing outpatient follow-up. ____________________________________________   FINAL CLINICAL IMPRESSION(S) / ED DIAGNOSES  Final diagnoses:  Edema, lower extremity        Note:  This document was prepared using Dragon voice recognition software and may include unintentional dictation errors       Sharyn Creamer, MD 08/05/20 1824

## 2020-08-05 NOTE — ED Triage Notes (Signed)
See first nurse note, pt has slurred speech that is difficult to understand but pt states that is normal for him from having previous stroke hx.  Pt states he was not able to get out of bed this morning, denies LOC.Marland Kitchen

## 2020-08-05 NOTE — ED Triage Notes (Addendum)
Pt comes vis EMS from home with increased weakness in both legs. Pt has hx of strokes. Wife found pt hunched over counter at about 0900am  Pt has slurred speech and weak grips and little left arm weakner from previous stroke. Pt at baseline.  Pt on blood thinners.  BP-135/82, CBG-135, 20 g IV in place  Pt states he just can't walk and is so weak in his legs. Pt has noticeable swelling to bilateral lower extremities.

## 2020-08-05 NOTE — ED Notes (Signed)
Patient's grandson coming to pick him up.

## 2020-08-05 NOTE — Discharge Instructions (Signed)
Please follow-up closely with your primary doctor, [facility].  Return to the ER if you have worsening symptoms, are unable to walk, noticed weakness in arm or leg, fevers, severe headache, or other new concerns arise.

## 2020-08-07 LAB — URINE CULTURE: Culture: <10000 — AB

## 2021-02-05 DIAGNOSIS — I10 Essential (primary) hypertension: Secondary | ICD-10-CM | POA: Diagnosis not present

## 2021-02-05 DIAGNOSIS — E785 Hyperlipidemia, unspecified: Secondary | ICD-10-CM | POA: Diagnosis not present

## 2021-02-05 DIAGNOSIS — E119 Type 2 diabetes mellitus without complications: Secondary | ICD-10-CM | POA: Diagnosis not present

## 2021-02-05 DIAGNOSIS — R609 Edema, unspecified: Secondary | ICD-10-CM | POA: Diagnosis not present

## 2021-02-05 DIAGNOSIS — E1165 Type 2 diabetes mellitus with hyperglycemia: Secondary | ICD-10-CM | POA: Diagnosis not present

## 2021-02-05 DIAGNOSIS — I63511 Cerebral infarction due to unspecified occlusion or stenosis of right middle cerebral artery: Secondary | ICD-10-CM | POA: Diagnosis not present

## 2021-02-05 DIAGNOSIS — R339 Retention of urine, unspecified: Secondary | ICD-10-CM | POA: Diagnosis not present

## 2021-02-10 DIAGNOSIS — I2510 Atherosclerotic heart disease of native coronary artery without angina pectoris: Secondary | ICD-10-CM | POA: Diagnosis not present

## 2021-02-10 DIAGNOSIS — E785 Hyperlipidemia, unspecified: Secondary | ICD-10-CM | POA: Diagnosis not present

## 2021-02-10 DIAGNOSIS — M1990 Unspecified osteoarthritis, unspecified site: Secondary | ICD-10-CM | POA: Diagnosis not present

## 2021-02-10 DIAGNOSIS — Z9181 History of falling: Secondary | ICD-10-CM | POA: Diagnosis not present

## 2021-02-10 DIAGNOSIS — Z8673 Personal history of transient ischemic attack (TIA), and cerebral infarction without residual deficits: Secondary | ICD-10-CM | POA: Diagnosis not present

## 2021-02-10 DIAGNOSIS — I10 Essential (primary) hypertension: Secondary | ICD-10-CM | POA: Diagnosis not present

## 2021-02-10 DIAGNOSIS — Z87891 Personal history of nicotine dependence: Secondary | ICD-10-CM | POA: Diagnosis not present

## 2021-02-10 DIAGNOSIS — E119 Type 2 diabetes mellitus without complications: Secondary | ICD-10-CM | POA: Diagnosis not present

## 2021-02-10 DIAGNOSIS — F482 Pseudobulbar affect: Secondary | ICD-10-CM | POA: Diagnosis not present

## 2021-02-10 DIAGNOSIS — Z7902 Long term (current) use of antithrombotics/antiplatelets: Secondary | ICD-10-CM | POA: Diagnosis not present

## 2021-02-13 DIAGNOSIS — Z7902 Long term (current) use of antithrombotics/antiplatelets: Secondary | ICD-10-CM | POA: Diagnosis not present

## 2021-02-13 DIAGNOSIS — Z87891 Personal history of nicotine dependence: Secondary | ICD-10-CM | POA: Diagnosis not present

## 2021-02-13 DIAGNOSIS — Z9181 History of falling: Secondary | ICD-10-CM | POA: Diagnosis not present

## 2021-02-13 DIAGNOSIS — E785 Hyperlipidemia, unspecified: Secondary | ICD-10-CM | POA: Diagnosis not present

## 2021-02-13 DIAGNOSIS — E119 Type 2 diabetes mellitus without complications: Secondary | ICD-10-CM | POA: Diagnosis not present

## 2021-02-13 DIAGNOSIS — F482 Pseudobulbar affect: Secondary | ICD-10-CM | POA: Diagnosis not present

## 2021-02-13 DIAGNOSIS — Z8673 Personal history of transient ischemic attack (TIA), and cerebral infarction without residual deficits: Secondary | ICD-10-CM | POA: Diagnosis not present

## 2021-02-13 DIAGNOSIS — I2510 Atherosclerotic heart disease of native coronary artery without angina pectoris: Secondary | ICD-10-CM | POA: Diagnosis not present

## 2021-02-13 DIAGNOSIS — I10 Essential (primary) hypertension: Secondary | ICD-10-CM | POA: Diagnosis not present

## 2021-02-13 DIAGNOSIS — M1990 Unspecified osteoarthritis, unspecified site: Secondary | ICD-10-CM | POA: Diagnosis not present

## 2021-02-16 DIAGNOSIS — Z7902 Long term (current) use of antithrombotics/antiplatelets: Secondary | ICD-10-CM | POA: Diagnosis not present

## 2021-02-16 DIAGNOSIS — Z9181 History of falling: Secondary | ICD-10-CM | POA: Diagnosis not present

## 2021-02-16 DIAGNOSIS — Z8673 Personal history of transient ischemic attack (TIA), and cerebral infarction without residual deficits: Secondary | ICD-10-CM | POA: Diagnosis not present

## 2021-02-16 DIAGNOSIS — E119 Type 2 diabetes mellitus without complications: Secondary | ICD-10-CM | POA: Diagnosis not present

## 2021-02-16 DIAGNOSIS — M1990 Unspecified osteoarthritis, unspecified site: Secondary | ICD-10-CM | POA: Diagnosis not present

## 2021-02-16 DIAGNOSIS — Z87891 Personal history of nicotine dependence: Secondary | ICD-10-CM | POA: Diagnosis not present

## 2021-02-16 DIAGNOSIS — I10 Essential (primary) hypertension: Secondary | ICD-10-CM | POA: Diagnosis not present

## 2021-02-16 DIAGNOSIS — I2510 Atherosclerotic heart disease of native coronary artery without angina pectoris: Secondary | ICD-10-CM | POA: Diagnosis not present

## 2021-02-16 DIAGNOSIS — F482 Pseudobulbar affect: Secondary | ICD-10-CM | POA: Diagnosis not present

## 2021-02-16 DIAGNOSIS — E785 Hyperlipidemia, unspecified: Secondary | ICD-10-CM | POA: Diagnosis not present

## 2021-02-17 DIAGNOSIS — F482 Pseudobulbar affect: Secondary | ICD-10-CM | POA: Diagnosis not present

## 2021-02-17 DIAGNOSIS — M1990 Unspecified osteoarthritis, unspecified site: Secondary | ICD-10-CM | POA: Diagnosis not present

## 2021-02-17 DIAGNOSIS — Z8673 Personal history of transient ischemic attack (TIA), and cerebral infarction without residual deficits: Secondary | ICD-10-CM | POA: Diagnosis not present

## 2021-02-17 DIAGNOSIS — I10 Essential (primary) hypertension: Secondary | ICD-10-CM | POA: Diagnosis not present

## 2021-02-17 DIAGNOSIS — Z9181 History of falling: Secondary | ICD-10-CM | POA: Diagnosis not present

## 2021-02-17 DIAGNOSIS — I2510 Atherosclerotic heart disease of native coronary artery without angina pectoris: Secondary | ICD-10-CM | POA: Diagnosis not present

## 2021-02-17 DIAGNOSIS — E119 Type 2 diabetes mellitus without complications: Secondary | ICD-10-CM | POA: Diagnosis not present

## 2021-02-17 DIAGNOSIS — E785 Hyperlipidemia, unspecified: Secondary | ICD-10-CM | POA: Diagnosis not present

## 2021-02-17 DIAGNOSIS — Z87891 Personal history of nicotine dependence: Secondary | ICD-10-CM | POA: Diagnosis not present

## 2021-02-17 DIAGNOSIS — Z7902 Long term (current) use of antithrombotics/antiplatelets: Secondary | ICD-10-CM | POA: Diagnosis not present

## 2021-02-18 DIAGNOSIS — Z87891 Personal history of nicotine dependence: Secondary | ICD-10-CM | POA: Diagnosis not present

## 2021-02-18 DIAGNOSIS — Z9181 History of falling: Secondary | ICD-10-CM | POA: Diagnosis not present

## 2021-02-18 DIAGNOSIS — I10 Essential (primary) hypertension: Secondary | ICD-10-CM | POA: Diagnosis not present

## 2021-02-18 DIAGNOSIS — Z7902 Long term (current) use of antithrombotics/antiplatelets: Secondary | ICD-10-CM | POA: Diagnosis not present

## 2021-02-18 DIAGNOSIS — Z8673 Personal history of transient ischemic attack (TIA), and cerebral infarction without residual deficits: Secondary | ICD-10-CM | POA: Diagnosis not present

## 2021-02-18 DIAGNOSIS — E119 Type 2 diabetes mellitus without complications: Secondary | ICD-10-CM | POA: Diagnosis not present

## 2021-02-18 DIAGNOSIS — M1990 Unspecified osteoarthritis, unspecified site: Secondary | ICD-10-CM | POA: Diagnosis not present

## 2021-02-18 DIAGNOSIS — F482 Pseudobulbar affect: Secondary | ICD-10-CM | POA: Diagnosis not present

## 2021-02-18 DIAGNOSIS — I2510 Atherosclerotic heart disease of native coronary artery without angina pectoris: Secondary | ICD-10-CM | POA: Diagnosis not present

## 2021-02-18 DIAGNOSIS — E785 Hyperlipidemia, unspecified: Secondary | ICD-10-CM | POA: Diagnosis not present

## 2021-02-19 DIAGNOSIS — Z7902 Long term (current) use of antithrombotics/antiplatelets: Secondary | ICD-10-CM | POA: Diagnosis not present

## 2021-02-19 DIAGNOSIS — E785 Hyperlipidemia, unspecified: Secondary | ICD-10-CM | POA: Diagnosis not present

## 2021-02-19 DIAGNOSIS — Z87891 Personal history of nicotine dependence: Secondary | ICD-10-CM | POA: Diagnosis not present

## 2021-02-19 DIAGNOSIS — Z9181 History of falling: Secondary | ICD-10-CM | POA: Diagnosis not present

## 2021-02-19 DIAGNOSIS — I10 Essential (primary) hypertension: Secondary | ICD-10-CM | POA: Diagnosis not present

## 2021-02-19 DIAGNOSIS — E119 Type 2 diabetes mellitus without complications: Secondary | ICD-10-CM | POA: Diagnosis not present

## 2021-02-19 DIAGNOSIS — Z8673 Personal history of transient ischemic attack (TIA), and cerebral infarction without residual deficits: Secondary | ICD-10-CM | POA: Diagnosis not present

## 2021-02-19 DIAGNOSIS — I2510 Atherosclerotic heart disease of native coronary artery without angina pectoris: Secondary | ICD-10-CM | POA: Diagnosis not present

## 2021-02-19 DIAGNOSIS — M1990 Unspecified osteoarthritis, unspecified site: Secondary | ICD-10-CM | POA: Diagnosis not present

## 2021-02-19 DIAGNOSIS — F482 Pseudobulbar affect: Secondary | ICD-10-CM | POA: Diagnosis not present

## 2021-02-21 DIAGNOSIS — E785 Hyperlipidemia, unspecified: Secondary | ICD-10-CM | POA: Diagnosis not present

## 2021-02-21 DIAGNOSIS — I2510 Atherosclerotic heart disease of native coronary artery without angina pectoris: Secondary | ICD-10-CM | POA: Diagnosis not present

## 2021-02-21 DIAGNOSIS — M1990 Unspecified osteoarthritis, unspecified site: Secondary | ICD-10-CM | POA: Diagnosis not present

## 2021-02-21 DIAGNOSIS — E119 Type 2 diabetes mellitus without complications: Secondary | ICD-10-CM | POA: Diagnosis not present

## 2021-02-21 DIAGNOSIS — Z8673 Personal history of transient ischemic attack (TIA), and cerebral infarction without residual deficits: Secondary | ICD-10-CM | POA: Diagnosis not present

## 2021-02-21 DIAGNOSIS — F482 Pseudobulbar affect: Secondary | ICD-10-CM | POA: Diagnosis not present

## 2021-02-21 DIAGNOSIS — Z87891 Personal history of nicotine dependence: Secondary | ICD-10-CM | POA: Diagnosis not present

## 2021-02-21 DIAGNOSIS — Z9181 History of falling: Secondary | ICD-10-CM | POA: Diagnosis not present

## 2021-02-21 DIAGNOSIS — I10 Essential (primary) hypertension: Secondary | ICD-10-CM | POA: Diagnosis not present

## 2021-02-21 DIAGNOSIS — Z7902 Long term (current) use of antithrombotics/antiplatelets: Secondary | ICD-10-CM | POA: Diagnosis not present

## 2021-02-23 DIAGNOSIS — E119 Type 2 diabetes mellitus without complications: Secondary | ICD-10-CM | POA: Diagnosis not present

## 2021-02-23 DIAGNOSIS — Z87891 Personal history of nicotine dependence: Secondary | ICD-10-CM | POA: Diagnosis not present

## 2021-02-23 DIAGNOSIS — I2510 Atherosclerotic heart disease of native coronary artery without angina pectoris: Secondary | ICD-10-CM | POA: Diagnosis not present

## 2021-02-23 DIAGNOSIS — Z7902 Long term (current) use of antithrombotics/antiplatelets: Secondary | ICD-10-CM | POA: Diagnosis not present

## 2021-02-23 DIAGNOSIS — Z9181 History of falling: Secondary | ICD-10-CM | POA: Diagnosis not present

## 2021-02-23 DIAGNOSIS — F482 Pseudobulbar affect: Secondary | ICD-10-CM | POA: Diagnosis not present

## 2021-02-23 DIAGNOSIS — Z8673 Personal history of transient ischemic attack (TIA), and cerebral infarction without residual deficits: Secondary | ICD-10-CM | POA: Diagnosis not present

## 2021-02-23 DIAGNOSIS — E785 Hyperlipidemia, unspecified: Secondary | ICD-10-CM | POA: Diagnosis not present

## 2021-02-23 DIAGNOSIS — M1990 Unspecified osteoarthritis, unspecified site: Secondary | ICD-10-CM | POA: Diagnosis not present

## 2021-02-23 DIAGNOSIS — I10 Essential (primary) hypertension: Secondary | ICD-10-CM | POA: Diagnosis not present

## 2021-02-24 DIAGNOSIS — Z9181 History of falling: Secondary | ICD-10-CM | POA: Diagnosis not present

## 2021-02-24 DIAGNOSIS — F482 Pseudobulbar affect: Secondary | ICD-10-CM | POA: Diagnosis not present

## 2021-02-24 DIAGNOSIS — I2510 Atherosclerotic heart disease of native coronary artery without angina pectoris: Secondary | ICD-10-CM | POA: Diagnosis not present

## 2021-02-24 DIAGNOSIS — I10 Essential (primary) hypertension: Secondary | ICD-10-CM | POA: Diagnosis not present

## 2021-02-24 DIAGNOSIS — E119 Type 2 diabetes mellitus without complications: Secondary | ICD-10-CM | POA: Diagnosis not present

## 2021-02-24 DIAGNOSIS — E785 Hyperlipidemia, unspecified: Secondary | ICD-10-CM | POA: Diagnosis not present

## 2021-02-24 DIAGNOSIS — M1990 Unspecified osteoarthritis, unspecified site: Secondary | ICD-10-CM | POA: Diagnosis not present

## 2021-02-24 DIAGNOSIS — Z8673 Personal history of transient ischemic attack (TIA), and cerebral infarction without residual deficits: Secondary | ICD-10-CM | POA: Diagnosis not present

## 2021-02-24 DIAGNOSIS — Z87891 Personal history of nicotine dependence: Secondary | ICD-10-CM | POA: Diagnosis not present

## 2021-02-24 DIAGNOSIS — Z7902 Long term (current) use of antithrombotics/antiplatelets: Secondary | ICD-10-CM | POA: Diagnosis not present

## 2021-02-25 DIAGNOSIS — I2510 Atherosclerotic heart disease of native coronary artery without angina pectoris: Secondary | ICD-10-CM | POA: Diagnosis not present

## 2021-02-25 DIAGNOSIS — Z8673 Personal history of transient ischemic attack (TIA), and cerebral infarction without residual deficits: Secondary | ICD-10-CM | POA: Diagnosis not present

## 2021-02-25 DIAGNOSIS — E785 Hyperlipidemia, unspecified: Secondary | ICD-10-CM | POA: Diagnosis not present

## 2021-02-25 DIAGNOSIS — Z87891 Personal history of nicotine dependence: Secondary | ICD-10-CM | POA: Diagnosis not present

## 2021-02-25 DIAGNOSIS — E119 Type 2 diabetes mellitus without complications: Secondary | ICD-10-CM | POA: Diagnosis not present

## 2021-02-25 DIAGNOSIS — F482 Pseudobulbar affect: Secondary | ICD-10-CM | POA: Diagnosis not present

## 2021-02-25 DIAGNOSIS — M1990 Unspecified osteoarthritis, unspecified site: Secondary | ICD-10-CM | POA: Diagnosis not present

## 2021-02-25 DIAGNOSIS — Z9181 History of falling: Secondary | ICD-10-CM | POA: Diagnosis not present

## 2021-02-25 DIAGNOSIS — Z7902 Long term (current) use of antithrombotics/antiplatelets: Secondary | ICD-10-CM | POA: Diagnosis not present

## 2021-02-25 DIAGNOSIS — I10 Essential (primary) hypertension: Secondary | ICD-10-CM | POA: Diagnosis not present

## 2021-02-26 DIAGNOSIS — Z7902 Long term (current) use of antithrombotics/antiplatelets: Secondary | ICD-10-CM | POA: Diagnosis not present

## 2021-02-26 DIAGNOSIS — M1990 Unspecified osteoarthritis, unspecified site: Secondary | ICD-10-CM | POA: Diagnosis not present

## 2021-02-26 DIAGNOSIS — E119 Type 2 diabetes mellitus without complications: Secondary | ICD-10-CM | POA: Diagnosis not present

## 2021-02-26 DIAGNOSIS — I2510 Atherosclerotic heart disease of native coronary artery without angina pectoris: Secondary | ICD-10-CM | POA: Diagnosis not present

## 2021-02-26 DIAGNOSIS — Z9181 History of falling: Secondary | ICD-10-CM | POA: Diagnosis not present

## 2021-02-26 DIAGNOSIS — Z87891 Personal history of nicotine dependence: Secondary | ICD-10-CM | POA: Diagnosis not present

## 2021-02-26 DIAGNOSIS — E785 Hyperlipidemia, unspecified: Secondary | ICD-10-CM | POA: Diagnosis not present

## 2021-02-26 DIAGNOSIS — I10 Essential (primary) hypertension: Secondary | ICD-10-CM | POA: Diagnosis not present

## 2021-02-26 DIAGNOSIS — F482 Pseudobulbar affect: Secondary | ICD-10-CM | POA: Diagnosis not present

## 2021-02-26 DIAGNOSIS — Z8673 Personal history of transient ischemic attack (TIA), and cerebral infarction without residual deficits: Secondary | ICD-10-CM | POA: Diagnosis not present

## 2021-02-27 DIAGNOSIS — Z7902 Long term (current) use of antithrombotics/antiplatelets: Secondary | ICD-10-CM | POA: Diagnosis not present

## 2021-02-27 DIAGNOSIS — E785 Hyperlipidemia, unspecified: Secondary | ICD-10-CM | POA: Diagnosis not present

## 2021-02-27 DIAGNOSIS — E119 Type 2 diabetes mellitus without complications: Secondary | ICD-10-CM | POA: Diagnosis not present

## 2021-02-27 DIAGNOSIS — M1990 Unspecified osteoarthritis, unspecified site: Secondary | ICD-10-CM | POA: Diagnosis not present

## 2021-02-27 DIAGNOSIS — I2510 Atherosclerotic heart disease of native coronary artery without angina pectoris: Secondary | ICD-10-CM | POA: Diagnosis not present

## 2021-02-27 DIAGNOSIS — F482 Pseudobulbar affect: Secondary | ICD-10-CM | POA: Diagnosis not present

## 2021-02-27 DIAGNOSIS — Z9181 History of falling: Secondary | ICD-10-CM | POA: Diagnosis not present

## 2021-02-27 DIAGNOSIS — Z8673 Personal history of transient ischemic attack (TIA), and cerebral infarction without residual deficits: Secondary | ICD-10-CM | POA: Diagnosis not present

## 2021-02-27 DIAGNOSIS — I10 Essential (primary) hypertension: Secondary | ICD-10-CM | POA: Diagnosis not present

## 2021-02-27 DIAGNOSIS — Z87891 Personal history of nicotine dependence: Secondary | ICD-10-CM | POA: Diagnosis not present

## 2021-03-02 DIAGNOSIS — M1990 Unspecified osteoarthritis, unspecified site: Secondary | ICD-10-CM | POA: Diagnosis not present

## 2021-03-02 DIAGNOSIS — Z7902 Long term (current) use of antithrombotics/antiplatelets: Secondary | ICD-10-CM | POA: Diagnosis not present

## 2021-03-02 DIAGNOSIS — I2510 Atherosclerotic heart disease of native coronary artery without angina pectoris: Secondary | ICD-10-CM | POA: Diagnosis not present

## 2021-03-02 DIAGNOSIS — E785 Hyperlipidemia, unspecified: Secondary | ICD-10-CM | POA: Diagnosis not present

## 2021-03-02 DIAGNOSIS — Z8673 Personal history of transient ischemic attack (TIA), and cerebral infarction without residual deficits: Secondary | ICD-10-CM | POA: Diagnosis not present

## 2021-03-02 DIAGNOSIS — Z87891 Personal history of nicotine dependence: Secondary | ICD-10-CM | POA: Diagnosis not present

## 2021-03-02 DIAGNOSIS — I10 Essential (primary) hypertension: Secondary | ICD-10-CM | POA: Diagnosis not present

## 2021-03-02 DIAGNOSIS — Z9181 History of falling: Secondary | ICD-10-CM | POA: Diagnosis not present

## 2021-03-02 DIAGNOSIS — E119 Type 2 diabetes mellitus without complications: Secondary | ICD-10-CM | POA: Diagnosis not present

## 2021-03-02 DIAGNOSIS — F482 Pseudobulbar affect: Secondary | ICD-10-CM | POA: Diagnosis not present

## 2021-03-10 IMAGING — CR DG CHEST 2V
1 series · 2 of 2 positions shown · non-contrast
Comparison: Chest radiograph June 22, 2020

CLINICAL DATA: Edema and increased weakness in both legs, concern
for CHF.

EXAM:
CHEST - 2 VIEW

[Series 1: dg chest 2 view · 0.14mm/px · 2 of 2 slices shown]
[im 1/2]
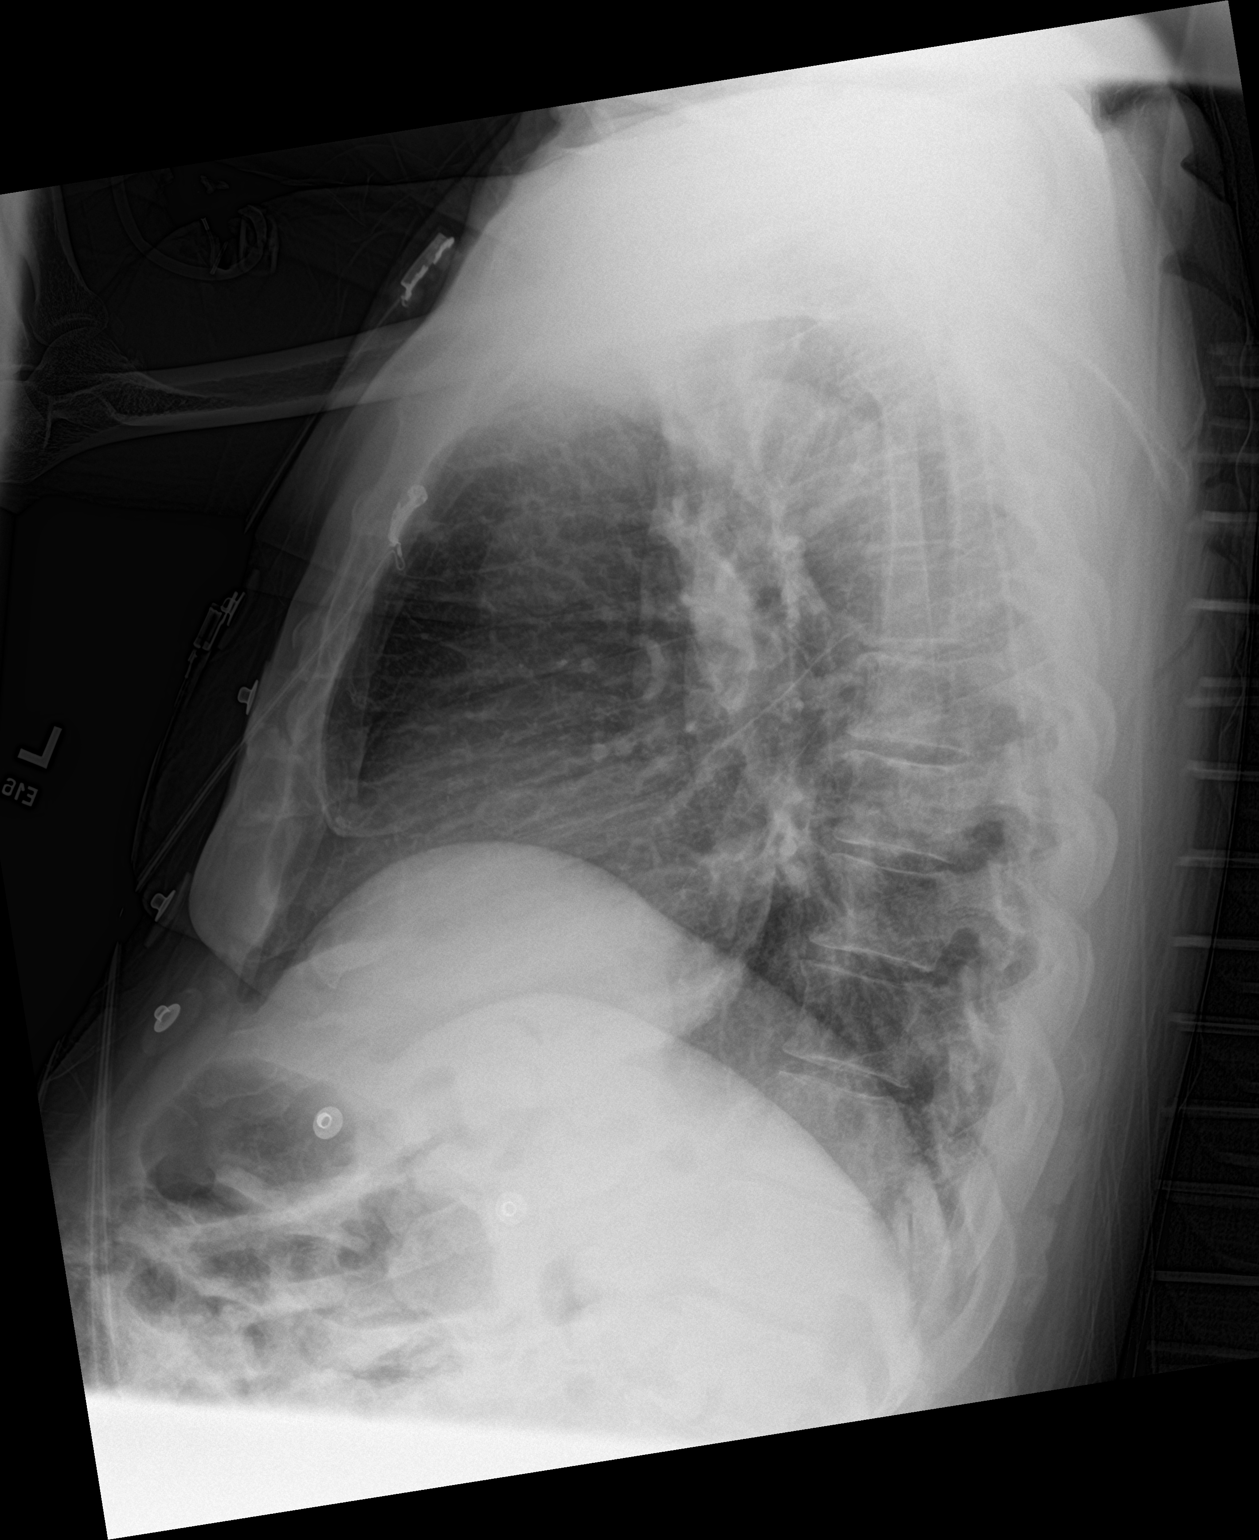
[im 2/2]
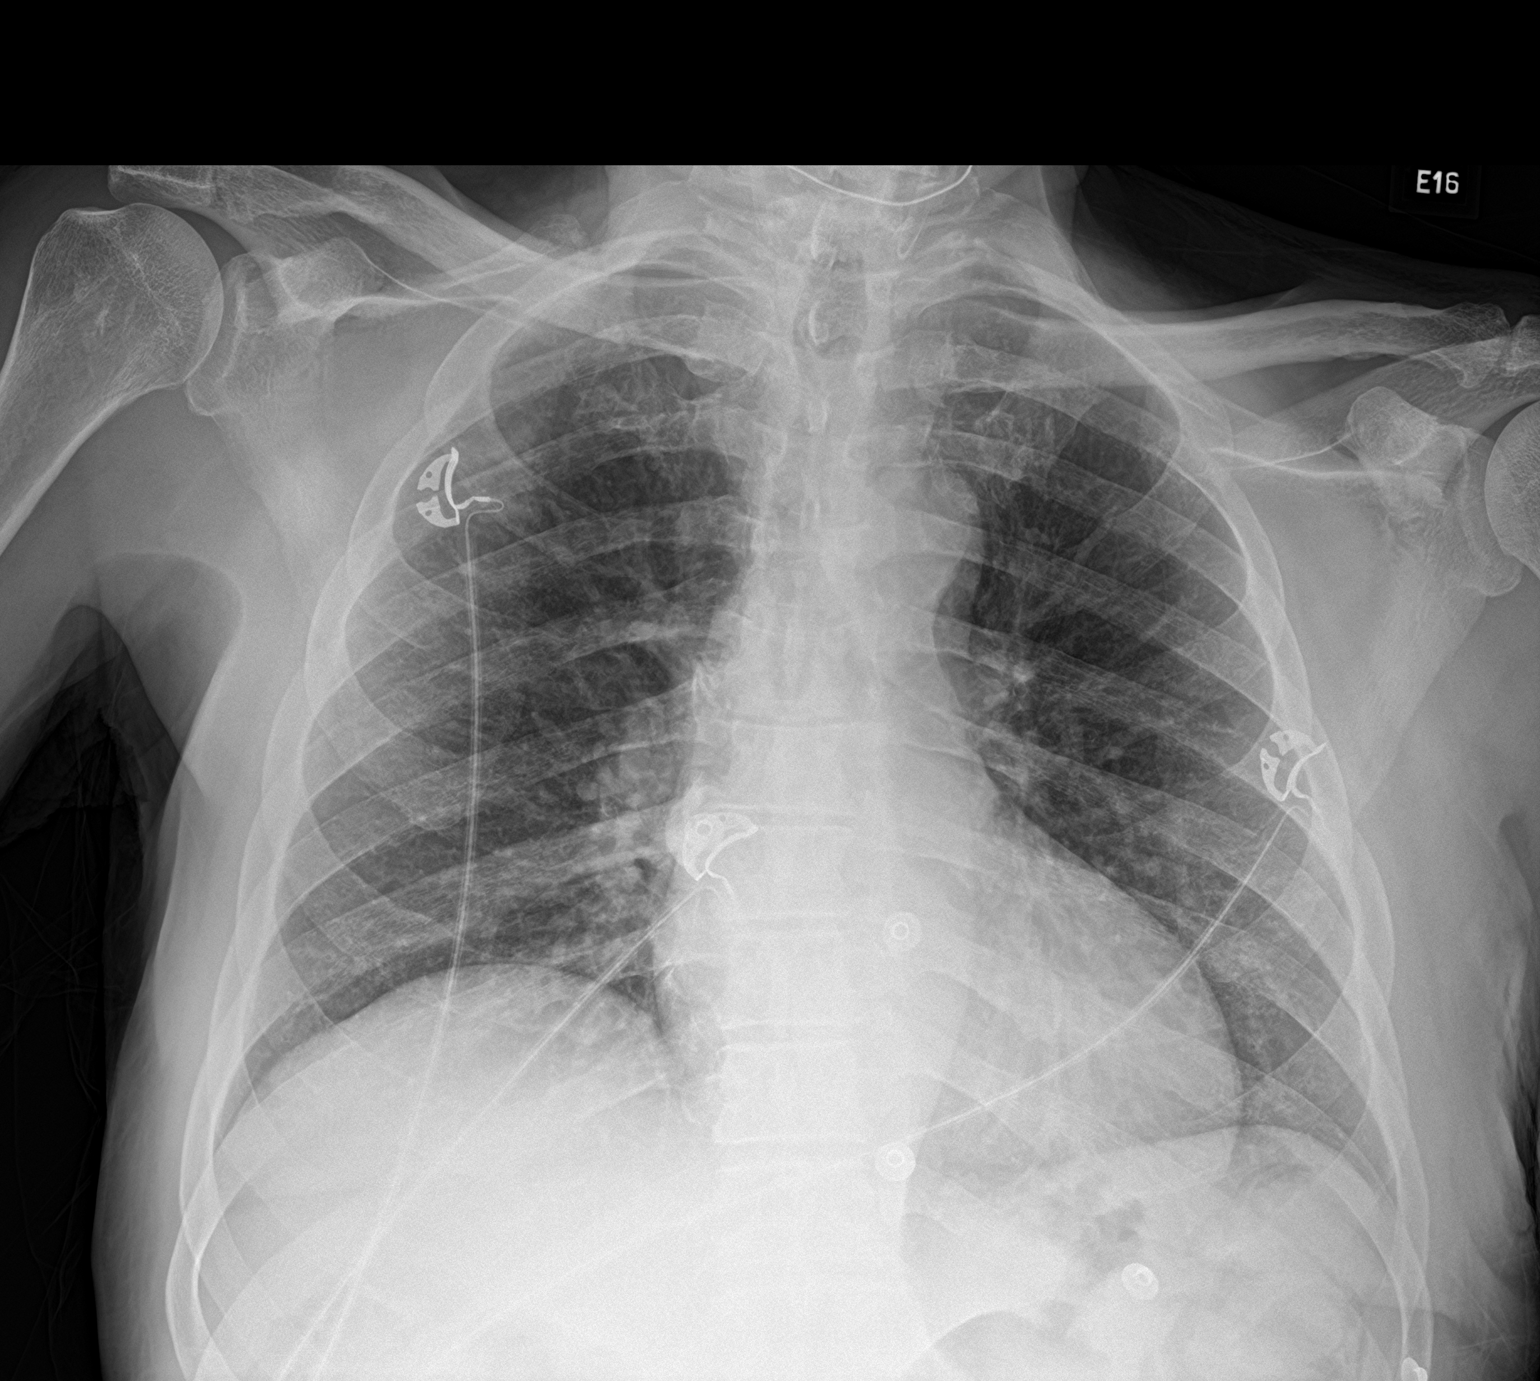

[2 of 2 positions shown; findings below may reference images not displayed]

FINDINGS: The heart size and mediastinal contours are within normal limits. No
focal consolidation. No pleural effusion. No pneumothorax. The
visualized skeletal structures are unremarkable.
IMPRESSION: No active cardiopulmonary disease.

## 2021-03-10 IMAGING — US US EXTREM LOW VENOUS
1 series · 14 of 24 positions shown · non-contrast
Comparison: None.

CLINICAL DATA: Lower leg swelling

EXAM:
BILATERAL LOWER EXTREMITY VENOUS DOPPLER ULTRASOUND
TECHNIQUE: Gray-scale sonography with compression, as well as color and duplex
ultrasound, were performed to evaluate the deep venous system(s)
from the level of the common femoral vein through the popliteal and
proximal calf veins.

[Series 1: us venous img lower bilat (dvt) · portal-venous · 14 of 71 slices shown]
[im 1/71]
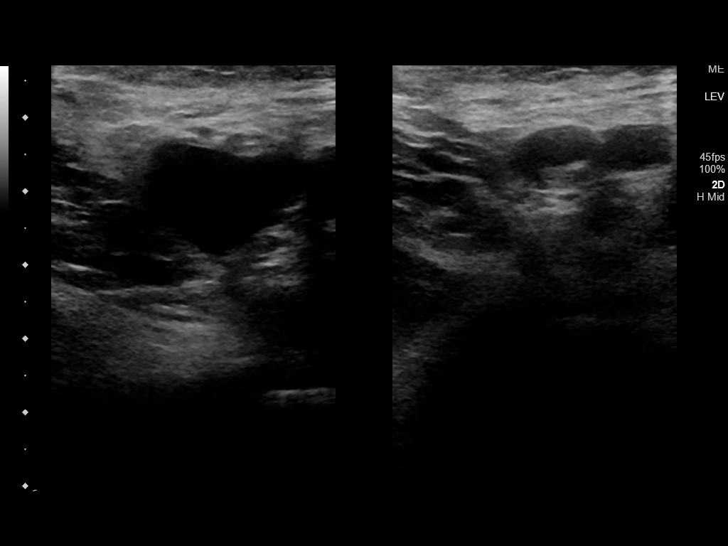
[im 7/71]
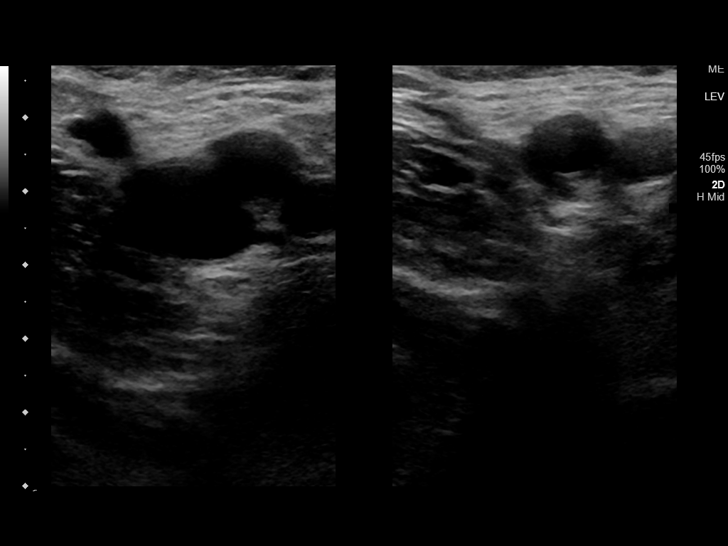
[im 13/71]
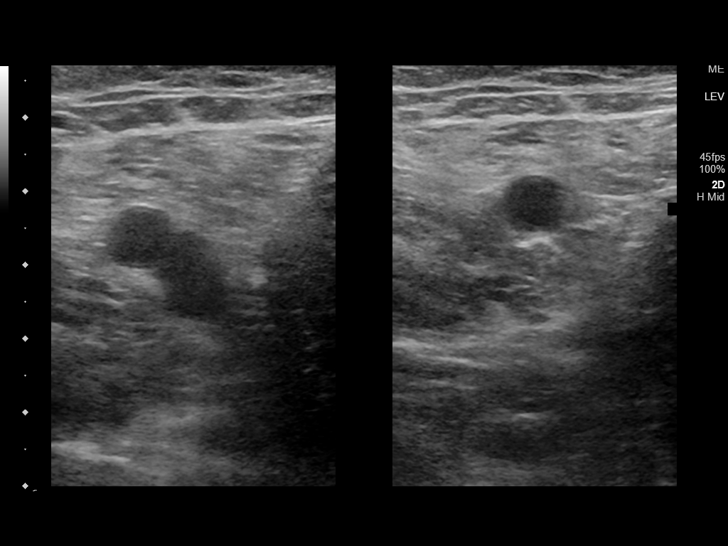
[im 19/71]
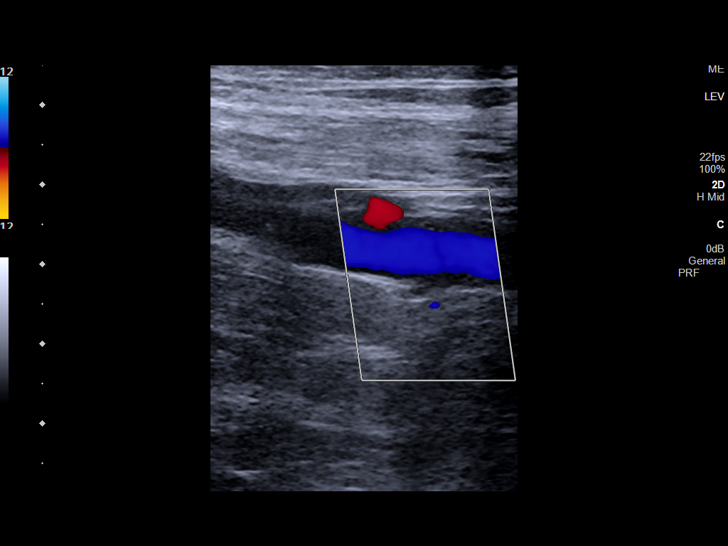
[im 22/71]
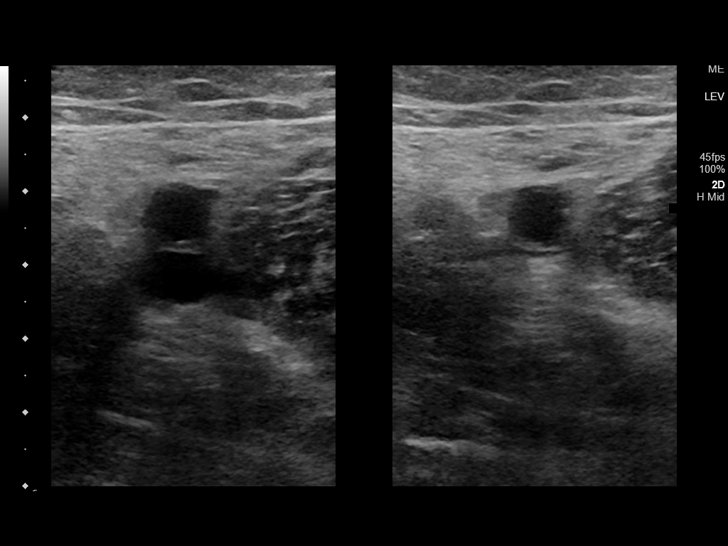
[im 28/71]
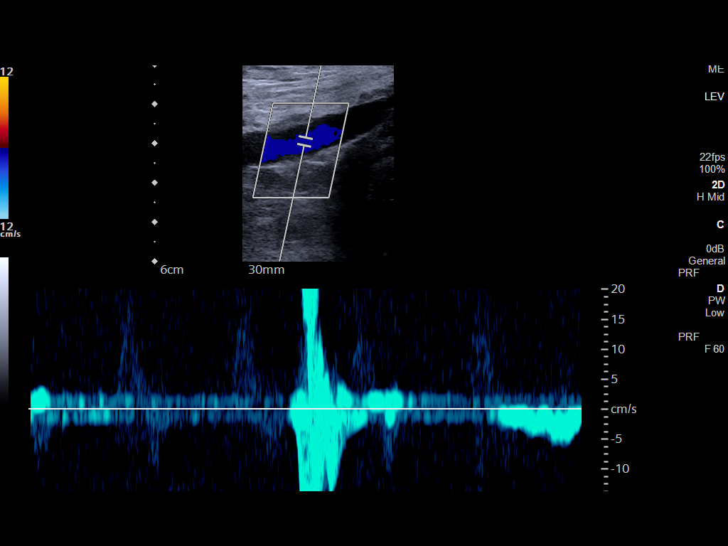
[im 34/71]
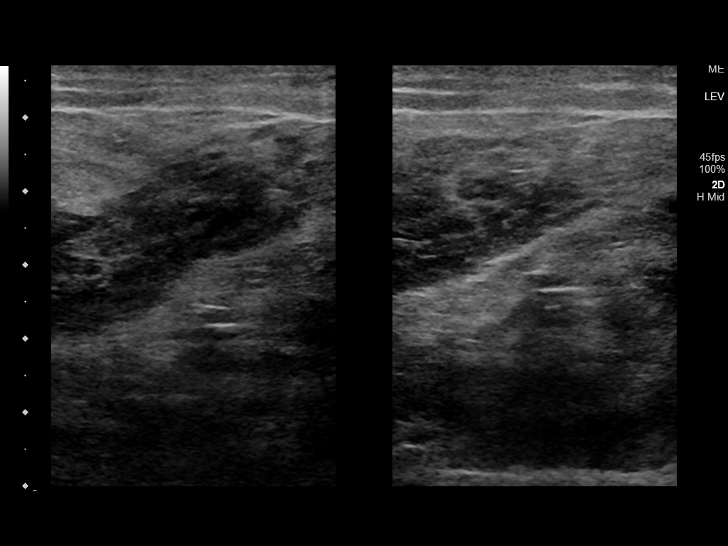
[im 37/71]
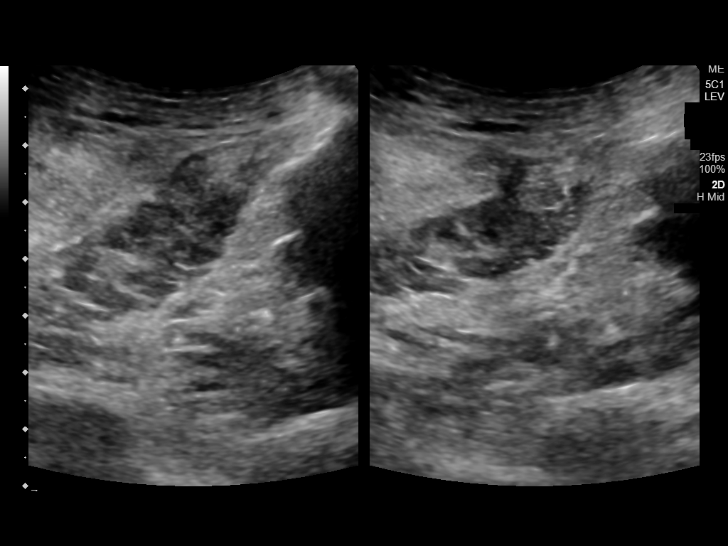
[im 43/71]
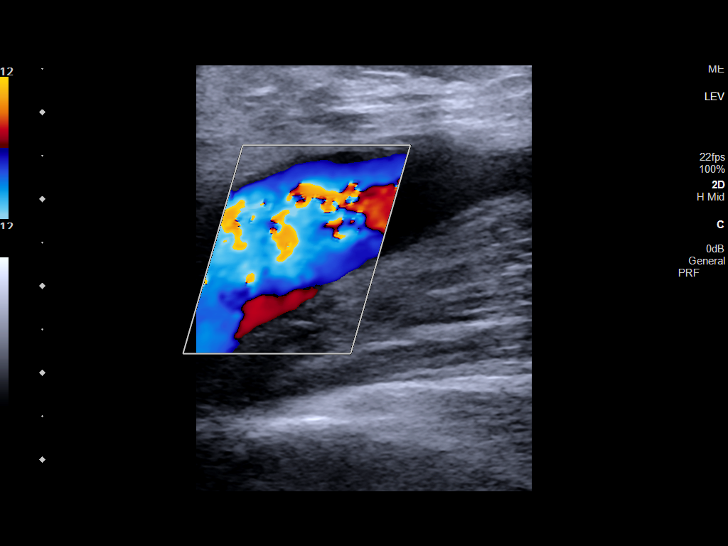
[im 49/71]
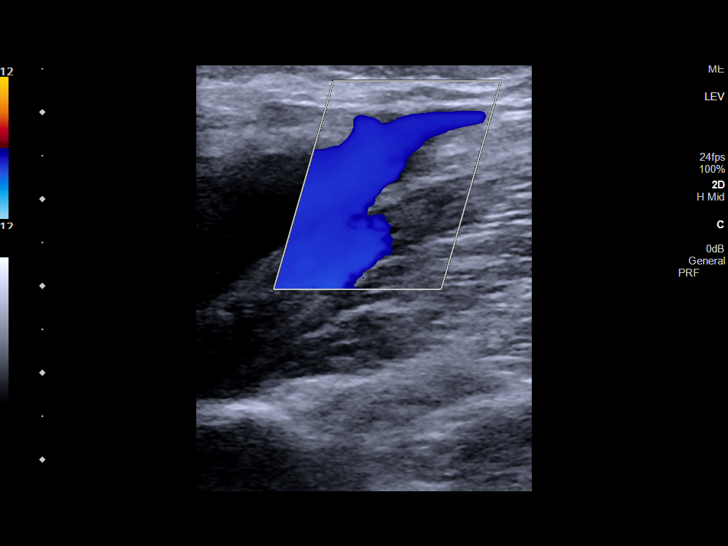
[im 55/71]
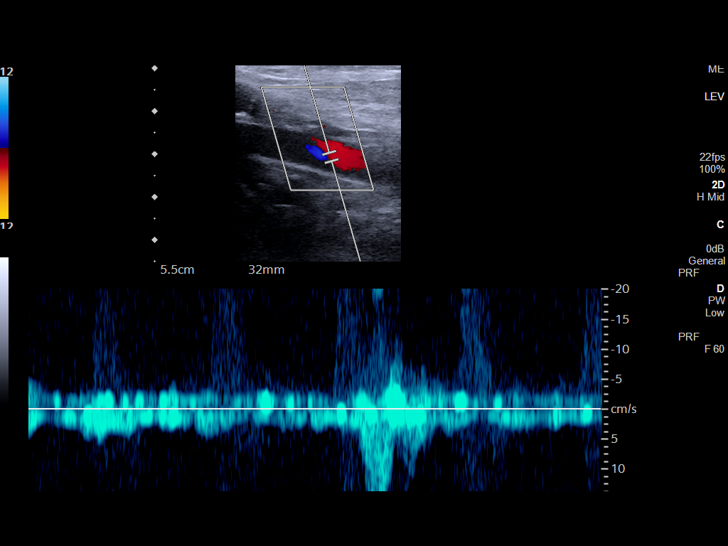
[im 58/71]
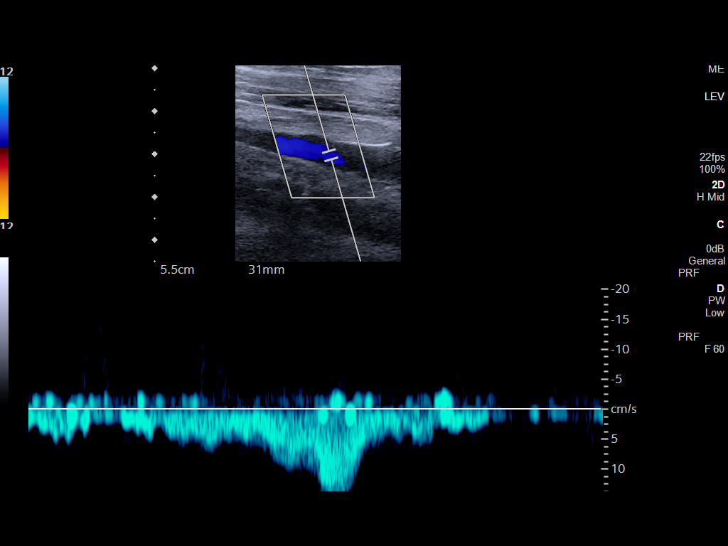
[im 64/71]
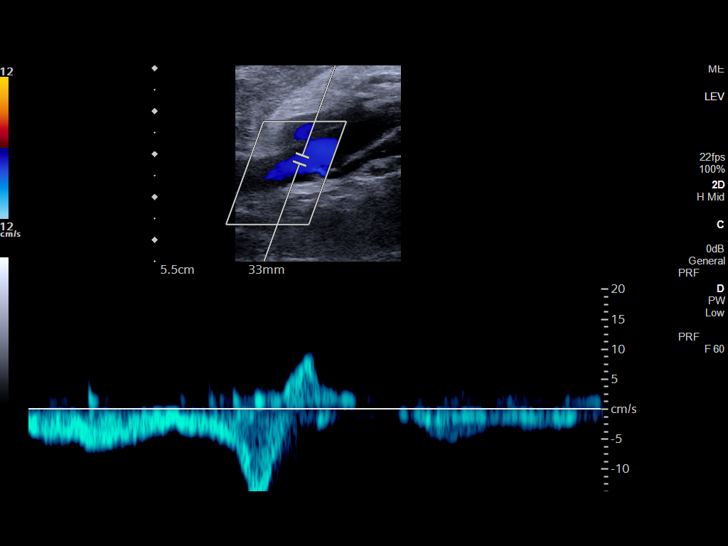
[im 71/71]
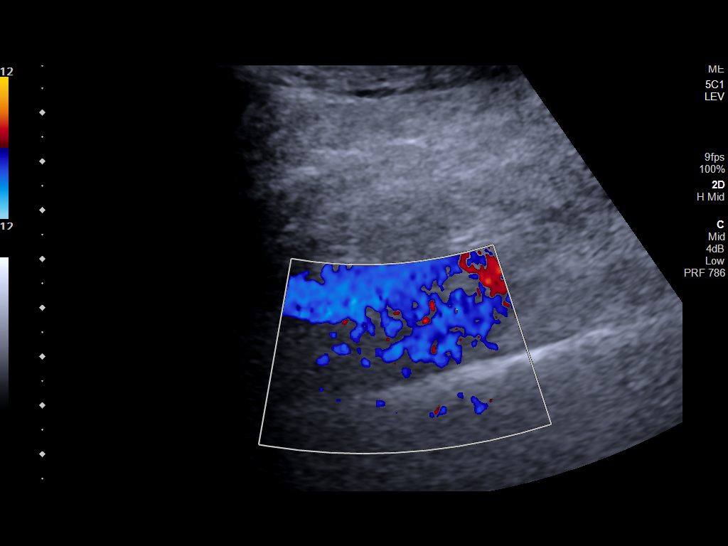

[14 of 24 positions shown; findings below may reference images not displayed]

FINDINGS: VENOUS

Normal compressibility of the common femoral, superficial femoral,
and popliteal veins. Calf veins are not well visualized. Visualized
portions of profunda femoral vein and great saphenous vein
unremarkable. No filling defects to suggest DVT on grayscale or
color Doppler imaging. Doppler waveforms show normal direction of
venous flow, normal respiratory plasticity and response to
augmentation.

OTHER

None.

Limitations: Calf veins not well visualized due to edema.
IMPRESSION: No evidence of lower extremity DVT bilaterally, noting poor
visualization of calf veins.

## 2021-03-20 DIAGNOSIS — Z79899 Other long term (current) drug therapy: Secondary | ICD-10-CM | POA: Diagnosis not present

## 2021-03-20 DIAGNOSIS — D6949 Other primary thrombocytopenia: Secondary | ICD-10-CM | POA: Diagnosis not present

## 2021-03-20 DIAGNOSIS — I2111 ST elevation (STEMI) myocardial infarction involving right coronary artery: Secondary | ICD-10-CM | POA: Diagnosis not present

## 2021-03-20 DIAGNOSIS — N189 Chronic kidney disease, unspecified: Secondary | ICD-10-CM | POA: Diagnosis not present

## 2021-03-20 DIAGNOSIS — R9431 Abnormal electrocardiogram [ECG] [EKG]: Secondary | ICD-10-CM | POA: Diagnosis not present

## 2021-03-20 DIAGNOSIS — R071 Chest pain on breathing: Secondary | ICD-10-CM | POA: Diagnosis not present

## 2021-03-20 DIAGNOSIS — I340 Nonrheumatic mitral (valve) insufficiency: Secondary | ICD-10-CM | POA: Diagnosis not present

## 2021-03-20 DIAGNOSIS — I214 Non-ST elevation (NSTEMI) myocardial infarction: Secondary | ICD-10-CM | POA: Diagnosis not present

## 2021-03-20 DIAGNOSIS — K5900 Constipation, unspecified: Secondary | ICD-10-CM | POA: Diagnosis not present

## 2021-03-20 DIAGNOSIS — I4581 Long QT syndrome: Secondary | ICD-10-CM | POA: Diagnosis not present

## 2021-03-20 DIAGNOSIS — Z20822 Contact with and (suspected) exposure to covid-19: Secondary | ICD-10-CM | POA: Diagnosis not present

## 2021-03-20 DIAGNOSIS — I361 Nonrheumatic tricuspid (valve) insufficiency: Secondary | ICD-10-CM | POA: Diagnosis not present

## 2021-03-20 DIAGNOSIS — I152 Hypertension secondary to endocrine disorders: Secondary | ICD-10-CM | POA: Diagnosis not present

## 2021-03-20 DIAGNOSIS — E1122 Type 2 diabetes mellitus with diabetic chronic kidney disease: Secondary | ICD-10-CM | POA: Diagnosis not present

## 2021-03-20 DIAGNOSIS — I213 ST elevation (STEMI) myocardial infarction of unspecified site: Secondary | ICD-10-CM | POA: Diagnosis not present

## 2021-03-20 DIAGNOSIS — I129 Hypertensive chronic kidney disease with stage 1 through stage 4 chronic kidney disease, or unspecified chronic kidney disease: Secondary | ICD-10-CM | POA: Diagnosis not present

## 2021-03-20 DIAGNOSIS — Z743 Need for continuous supervision: Secondary | ICD-10-CM | POA: Diagnosis not present

## 2021-03-20 DIAGNOSIS — E1159 Type 2 diabetes mellitus with other circulatory complications: Secondary | ICD-10-CM | POA: Diagnosis not present

## 2021-03-20 DIAGNOSIS — E1169 Type 2 diabetes mellitus with other specified complication: Secondary | ICD-10-CM | POA: Diagnosis not present

## 2021-03-20 DIAGNOSIS — E785 Hyperlipidemia, unspecified: Secondary | ICD-10-CM | POA: Diagnosis not present

## 2021-03-20 DIAGNOSIS — I959 Hypotension, unspecified: Secondary | ICD-10-CM | POA: Diagnosis not present

## 2021-03-20 DIAGNOSIS — N179 Acute kidney failure, unspecified: Secondary | ICD-10-CM | POA: Diagnosis not present

## 2021-03-20 DIAGNOSIS — R001 Bradycardia, unspecified: Secondary | ICD-10-CM | POA: Diagnosis not present

## 2021-03-20 DIAGNOSIS — I69328 Other speech and language deficits following cerebral infarction: Secondary | ICD-10-CM | POA: Diagnosis not present

## 2021-03-20 DIAGNOSIS — I2582 Chronic total occlusion of coronary artery: Secondary | ICD-10-CM | POA: Diagnosis not present

## 2021-03-20 DIAGNOSIS — I69322 Dysarthria following cerebral infarction: Secondary | ICD-10-CM | POA: Diagnosis not present

## 2021-03-20 DIAGNOSIS — I2510 Atherosclerotic heart disease of native coronary artery without angina pectoris: Secondary | ICD-10-CM | POA: Diagnosis not present

## 2021-03-20 DIAGNOSIS — I2119 ST elevation (STEMI) myocardial infarction involving other coronary artery of inferior wall: Secondary | ICD-10-CM | POA: Diagnosis not present

## 2021-03-20 DIAGNOSIS — D696 Thrombocytopenia, unspecified: Secondary | ICD-10-CM | POA: Diagnosis not present

## 2021-03-20 DIAGNOSIS — I25119 Atherosclerotic heart disease of native coronary artery with unspecified angina pectoris: Secondary | ICD-10-CM | POA: Diagnosis not present

## 2021-03-20 DIAGNOSIS — R1310 Dysphagia, unspecified: Secondary | ICD-10-CM | POA: Diagnosis not present

## 2021-03-26 DIAGNOSIS — R339 Retention of urine, unspecified: Secondary | ICD-10-CM | POA: Diagnosis not present

## 2021-03-26 DIAGNOSIS — Z7902 Long term (current) use of antithrombotics/antiplatelets: Secondary | ICD-10-CM | POA: Diagnosis not present

## 2021-03-26 DIAGNOSIS — R0602 Shortness of breath: Secondary | ICD-10-CM | POA: Diagnosis not present

## 2021-03-26 DIAGNOSIS — I2111 ST elevation (STEMI) myocardial infarction involving right coronary artery: Secondary | ICD-10-CM | POA: Diagnosis not present

## 2021-03-26 DIAGNOSIS — I10 Essential (primary) hypertension: Secondary | ICD-10-CM | POA: Diagnosis not present

## 2021-03-26 DIAGNOSIS — R5383 Other fatigue: Secondary | ICD-10-CM | POA: Diagnosis not present

## 2021-03-26 DIAGNOSIS — E860 Dehydration: Secondary | ICD-10-CM | POA: Diagnosis not present

## 2021-03-26 DIAGNOSIS — Z955 Presence of coronary angioplasty implant and graft: Secondary | ICD-10-CM | POA: Diagnosis not present

## 2021-03-26 DIAGNOSIS — I2119 ST elevation (STEMI) myocardial infarction involving other coronary artery of inferior wall: Secondary | ICD-10-CM | POA: Diagnosis not present

## 2021-03-26 DIAGNOSIS — E872 Acidosis, unspecified: Secondary | ICD-10-CM | POA: Diagnosis not present

## 2021-03-26 DIAGNOSIS — E1159 Type 2 diabetes mellitus with other circulatory complications: Secondary | ICD-10-CM | POA: Diagnosis not present

## 2021-03-26 DIAGNOSIS — I69322 Dysarthria following cerebral infarction: Secondary | ICD-10-CM | POA: Diagnosis not present

## 2021-03-26 DIAGNOSIS — I9589 Other hypotension: Secondary | ICD-10-CM | POA: Diagnosis not present

## 2021-03-26 DIAGNOSIS — N183 Chronic kidney disease, stage 3 unspecified: Secondary | ICD-10-CM | POA: Diagnosis not present

## 2021-03-26 DIAGNOSIS — E878 Other disorders of electrolyte and fluid balance, not elsewhere classified: Secondary | ICD-10-CM | POA: Diagnosis not present

## 2021-03-26 DIAGNOSIS — I129 Hypertensive chronic kidney disease with stage 1 through stage 4 chronic kidney disease, or unspecified chronic kidney disease: Secondary | ICD-10-CM | POA: Diagnosis not present

## 2021-03-26 DIAGNOSIS — N179 Acute kidney failure, unspecified: Secondary | ICD-10-CM | POA: Diagnosis not present

## 2021-03-26 DIAGNOSIS — I252 Old myocardial infarction: Secondary | ICD-10-CM | POA: Diagnosis not present

## 2021-03-26 DIAGNOSIS — Z87891 Personal history of nicotine dependence: Secondary | ICD-10-CM | POA: Diagnosis not present

## 2021-03-26 DIAGNOSIS — E785 Hyperlipidemia, unspecified: Secondary | ICD-10-CM | POA: Diagnosis not present

## 2021-03-26 DIAGNOSIS — R9431 Abnormal electrocardiogram [ECG] [EKG]: Secondary | ICD-10-CM | POA: Diagnosis not present

## 2021-03-26 DIAGNOSIS — E1122 Type 2 diabetes mellitus with diabetic chronic kidney disease: Secondary | ICD-10-CM | POA: Diagnosis not present

## 2021-03-26 DIAGNOSIS — I959 Hypotension, unspecified: Secondary | ICD-10-CM | POA: Diagnosis not present

## 2021-03-26 DIAGNOSIS — I69351 Hemiplegia and hemiparesis following cerebral infarction affecting right dominant side: Secondary | ICD-10-CM | POA: Diagnosis not present

## 2021-03-26 DIAGNOSIS — I2510 Atherosclerotic heart disease of native coronary artery without angina pectoris: Secondary | ICD-10-CM | POA: Diagnosis not present

## 2021-03-26 DIAGNOSIS — E861 Hypovolemia: Secondary | ICD-10-CM | POA: Diagnosis not present

## 2021-03-26 DIAGNOSIS — Z79899 Other long term (current) drug therapy: Secondary | ICD-10-CM | POA: Diagnosis not present

## 2021-03-26 DIAGNOSIS — E119 Type 2 diabetes mellitus without complications: Secondary | ICD-10-CM | POA: Diagnosis not present

## 2021-03-26 DIAGNOSIS — Z7982 Long term (current) use of aspirin: Secondary | ICD-10-CM | POA: Diagnosis not present

## 2021-03-26 DIAGNOSIS — Z20822 Contact with and (suspected) exposure to covid-19: Secondary | ICD-10-CM | POA: Diagnosis not present

## 2021-03-26 DIAGNOSIS — Z8673 Personal history of transient ischemic attack (TIA), and cerebral infarction without residual deficits: Secondary | ICD-10-CM | POA: Diagnosis not present

## 2021-03-26 DIAGNOSIS — Z23 Encounter for immunization: Secondary | ICD-10-CM | POA: Diagnosis not present

## 2021-03-26 DIAGNOSIS — R778 Other specified abnormalities of plasma proteins: Secondary | ICD-10-CM | POA: Diagnosis not present

## 2021-03-26 DIAGNOSIS — R071 Chest pain on breathing: Secondary | ICD-10-CM | POA: Diagnosis not present

## 2021-03-26 DIAGNOSIS — I152 Hypertension secondary to endocrine disorders: Secondary | ICD-10-CM | POA: Diagnosis not present

## 2021-03-26 DIAGNOSIS — E869 Volume depletion, unspecified: Secondary | ICD-10-CM | POA: Diagnosis not present

## 2021-03-26 DIAGNOSIS — E1169 Type 2 diabetes mellitus with other specified complication: Secondary | ICD-10-CM | POA: Diagnosis not present

## 2021-03-26 DIAGNOSIS — E7849 Other hyperlipidemia: Secondary | ICD-10-CM | POA: Diagnosis not present

## 2021-03-26 DIAGNOSIS — N289 Disorder of kidney and ureter, unspecified: Secondary | ICD-10-CM | POA: Diagnosis not present

## 2021-03-26 DIAGNOSIS — R001 Bradycardia, unspecified: Secondary | ICD-10-CM | POA: Diagnosis not present

## 2021-03-27 DIAGNOSIS — N289 Disorder of kidney and ureter, unspecified: Secondary | ICD-10-CM | POA: Diagnosis not present

## 2021-03-27 DIAGNOSIS — E785 Hyperlipidemia, unspecified: Secondary | ICD-10-CM | POA: Diagnosis not present

## 2021-03-27 DIAGNOSIS — I9589 Other hypotension: Secondary | ICD-10-CM | POA: Diagnosis not present

## 2021-03-27 DIAGNOSIS — I152 Hypertension secondary to endocrine disorders: Secondary | ICD-10-CM | POA: Diagnosis not present

## 2021-03-27 DIAGNOSIS — N179 Acute kidney failure, unspecified: Secondary | ICD-10-CM | POA: Diagnosis not present

## 2021-03-27 DIAGNOSIS — E1159 Type 2 diabetes mellitus with other circulatory complications: Secondary | ICD-10-CM | POA: Diagnosis not present

## 2021-03-27 DIAGNOSIS — I959 Hypotension, unspecified: Secondary | ICD-10-CM | POA: Diagnosis not present

## 2021-03-27 DIAGNOSIS — E1169 Type 2 diabetes mellitus with other specified complication: Secondary | ICD-10-CM | POA: Diagnosis not present

## 2021-03-27 DIAGNOSIS — I2510 Atherosclerotic heart disease of native coronary artery without angina pectoris: Secondary | ICD-10-CM | POA: Diagnosis not present

## 2021-03-28 DIAGNOSIS — I252 Old myocardial infarction: Secondary | ICD-10-CM | POA: Diagnosis not present

## 2021-03-28 DIAGNOSIS — N179 Acute kidney failure, unspecified: Secondary | ICD-10-CM | POA: Diagnosis not present

## 2021-03-28 DIAGNOSIS — I10 Essential (primary) hypertension: Secondary | ICD-10-CM | POA: Diagnosis not present

## 2021-03-28 DIAGNOSIS — I959 Hypotension, unspecified: Secondary | ICD-10-CM | POA: Diagnosis not present

## 2021-03-28 DIAGNOSIS — R071 Chest pain on breathing: Secondary | ICD-10-CM | POA: Diagnosis not present

## 2021-03-28 DIAGNOSIS — E1169 Type 2 diabetes mellitus with other specified complication: Secondary | ICD-10-CM | POA: Diagnosis not present

## 2021-03-28 DIAGNOSIS — I2510 Atherosclerotic heart disease of native coronary artery without angina pectoris: Secondary | ICD-10-CM | POA: Diagnosis not present

## 2021-03-28 DIAGNOSIS — R778 Other specified abnormalities of plasma proteins: Secondary | ICD-10-CM | POA: Diagnosis not present

## 2021-03-28 DIAGNOSIS — E7849 Other hyperlipidemia: Secondary | ICD-10-CM | POA: Diagnosis not present

## 2021-03-28 DIAGNOSIS — R9431 Abnormal electrocardiogram [ECG] [EKG]: Secondary | ICD-10-CM | POA: Diagnosis not present

## 2021-03-28 DIAGNOSIS — E119 Type 2 diabetes mellitus without complications: Secondary | ICD-10-CM | POA: Diagnosis not present

## 2021-03-28 DIAGNOSIS — N289 Disorder of kidney and ureter, unspecified: Secondary | ICD-10-CM | POA: Diagnosis not present

## 2021-03-28 DIAGNOSIS — Z8673 Personal history of transient ischemic attack (TIA), and cerebral infarction without residual deficits: Secondary | ICD-10-CM | POA: Diagnosis not present

## 2021-03-29 DIAGNOSIS — E785 Hyperlipidemia, unspecified: Secondary | ICD-10-CM | POA: Diagnosis not present

## 2021-03-29 DIAGNOSIS — E1169 Type 2 diabetes mellitus with other specified complication: Secondary | ICD-10-CM | POA: Diagnosis not present

## 2021-03-29 DIAGNOSIS — I959 Hypotension, unspecified: Secondary | ICD-10-CM | POA: Diagnosis not present

## 2021-03-29 DIAGNOSIS — E1159 Type 2 diabetes mellitus with other circulatory complications: Secondary | ICD-10-CM | POA: Diagnosis not present

## 2021-03-29 DIAGNOSIS — I152 Hypertension secondary to endocrine disorders: Secondary | ICD-10-CM | POA: Diagnosis not present

## 2021-03-29 DIAGNOSIS — I2510 Atherosclerotic heart disease of native coronary artery without angina pectoris: Secondary | ICD-10-CM | POA: Diagnosis not present

## 2021-03-29 DIAGNOSIS — N289 Disorder of kidney and ureter, unspecified: Secondary | ICD-10-CM | POA: Diagnosis not present

## 2021-03-29 DIAGNOSIS — E869 Volume depletion, unspecified: Secondary | ICD-10-CM | POA: Diagnosis not present

## 2021-03-29 DIAGNOSIS — N179 Acute kidney failure, unspecified: Secondary | ICD-10-CM | POA: Diagnosis not present

## 2021-03-30 DIAGNOSIS — E1169 Type 2 diabetes mellitus with other specified complication: Secondary | ICD-10-CM | POA: Diagnosis not present

## 2021-03-30 DIAGNOSIS — I9589 Other hypotension: Secondary | ICD-10-CM | POA: Diagnosis not present

## 2021-03-30 DIAGNOSIS — R071 Chest pain on breathing: Secondary | ICD-10-CM | POA: Diagnosis not present

## 2021-03-30 DIAGNOSIS — E785 Hyperlipidemia, unspecified: Secondary | ICD-10-CM | POA: Diagnosis not present

## 2021-03-30 DIAGNOSIS — E1159 Type 2 diabetes mellitus with other circulatory complications: Secondary | ICD-10-CM | POA: Diagnosis not present

## 2021-03-30 DIAGNOSIS — I2510 Atherosclerotic heart disease of native coronary artery without angina pectoris: Secondary | ICD-10-CM | POA: Diagnosis not present

## 2021-03-30 DIAGNOSIS — N289 Disorder of kidney and ureter, unspecified: Secondary | ICD-10-CM | POA: Diagnosis not present

## 2021-03-30 DIAGNOSIS — E869 Volume depletion, unspecified: Secondary | ICD-10-CM | POA: Diagnosis not present

## 2021-03-30 DIAGNOSIS — N179 Acute kidney failure, unspecified: Secondary | ICD-10-CM | POA: Diagnosis not present

## 2021-03-31 DIAGNOSIS — N179 Acute kidney failure, unspecified: Secondary | ICD-10-CM | POA: Diagnosis not present

## 2021-03-31 DIAGNOSIS — R5383 Other fatigue: Secondary | ICD-10-CM | POA: Diagnosis not present

## 2021-03-31 DIAGNOSIS — I9589 Other hypotension: Secondary | ICD-10-CM | POA: Diagnosis not present

## 2021-04-07 DIAGNOSIS — N179 Acute kidney failure, unspecified: Secondary | ICD-10-CM | POA: Diagnosis not present

## 2021-04-07 DIAGNOSIS — I951 Orthostatic hypotension: Secondary | ICD-10-CM | POA: Diagnosis not present

## 2021-04-07 DIAGNOSIS — E1169 Type 2 diabetes mellitus with other specified complication: Secondary | ICD-10-CM | POA: Diagnosis not present

## 2021-04-07 DIAGNOSIS — I2510 Atherosclerotic heart disease of native coronary artery without angina pectoris: Secondary | ICD-10-CM | POA: Diagnosis not present

## 2021-04-07 DIAGNOSIS — E785 Hyperlipidemia, unspecified: Secondary | ICD-10-CM | POA: Diagnosis not present

## 2021-04-07 DIAGNOSIS — I2111 ST elevation (STEMI) myocardial infarction involving right coronary artery: Secondary | ICD-10-CM | POA: Diagnosis not present

## 2021-04-07 DIAGNOSIS — E1159 Type 2 diabetes mellitus with other circulatory complications: Secondary | ICD-10-CM | POA: Diagnosis not present

## 2021-04-07 DIAGNOSIS — I152 Hypertension secondary to endocrine disorders: Secondary | ICD-10-CM | POA: Diagnosis not present

## 2021-04-07 DIAGNOSIS — N170 Acute kidney failure with tubular necrosis: Secondary | ICD-10-CM | POA: Diagnosis not present

## 2021-04-10 DIAGNOSIS — N179 Acute kidney failure, unspecified: Secondary | ICD-10-CM | POA: Diagnosis not present
